# Patient Record
Sex: Female | Born: 1935 | Race: Black or African American | Hispanic: No | Smoking: Never smoker
Health system: Southern US, Community
[De-identification: ages and names within clinical notes are randomized; demographics above are authoritative.]

## PROBLEM LIST (undated history)

## (undated) DIAGNOSIS — K5730 Diverticulosis of large intestine without perforation or abscess without bleeding: Secondary | ICD-10-CM

## (undated) DIAGNOSIS — K2970 Gastritis, unspecified, without bleeding: Secondary | ICD-10-CM

## (undated) DIAGNOSIS — E785 Hyperlipidemia, unspecified: Secondary | ICD-10-CM

## (undated) DIAGNOSIS — F419 Anxiety disorder, unspecified: Secondary | ICD-10-CM

## (undated) DIAGNOSIS — T7840XA Allergy, unspecified, initial encounter: Secondary | ICD-10-CM

## (undated) DIAGNOSIS — I10 Essential (primary) hypertension: Secondary | ICD-10-CM

## (undated) DIAGNOSIS — K5792 Diverticulitis of intestine, part unspecified, without perforation or abscess without bleeding: Secondary | ICD-10-CM

## (undated) DIAGNOSIS — K219 Gastro-esophageal reflux disease without esophagitis: Secondary | ICD-10-CM

## (undated) DIAGNOSIS — M1990 Unspecified osteoarthritis, unspecified site: Secondary | ICD-10-CM

## (undated) DIAGNOSIS — Z8719 Personal history of other diseases of the digestive system: Secondary | ICD-10-CM

## (undated) HISTORY — DX: Hyperlipidemia, unspecified: E78.5

## (undated) HISTORY — PX: UPPER GASTROINTESTINAL ENDOSCOPY: SHX188

## (undated) HISTORY — DX: Anxiety disorder, unspecified: F41.9

## (undated) HISTORY — DX: Unspecified osteoarthritis, unspecified site: M19.90

## (undated) HISTORY — PX: KIDNEY SURGERY: SHX687

## (undated) HISTORY — DX: Allergy, unspecified, initial encounter: T78.40XA

## (undated) HISTORY — DX: Personal history of other diseases of the digestive system: Z87.19

## (undated) HISTORY — PX: WISDOM TOOTH EXTRACTION: SHX21

## (undated) HISTORY — DX: Diverticulosis of large intestine without perforation or abscess without bleeding: K57.30

## (undated) HISTORY — DX: Gastritis, unspecified, without bleeding: K29.70

## (undated) HISTORY — PX: COLONOSCOPY: SHX174

## (undated) HISTORY — DX: Diverticulitis of intestine, part unspecified, without perforation or abscess without bleeding: K57.92

## (undated) HISTORY — PX: TOTAL ABDOMINAL HYSTERECTOMY: SHX209

## (undated) HISTORY — PX: BLADDER REPAIR: SHX76

## (undated) HISTORY — DX: Gastro-esophageal reflux disease without esophagitis: K21.9

## (undated) HISTORY — DX: Essential (primary) hypertension: I10

---

## 1996-03-16 HISTORY — PX: KNEE ARTHROSCOPY: SHX127

## 1999-06-03 ENCOUNTER — Encounter: Payer: Self-pay | Admitting: Pulmonary Disease

## 1999-06-03 ENCOUNTER — Ambulatory Visit (HOSPITAL_COMMUNITY): Admission: RE | Admit: 1999-06-03 | Discharge: 1999-06-03 | Payer: Self-pay | Admitting: Pulmonary Disease

## 2000-11-22 ENCOUNTER — Encounter (INDEPENDENT_AMBULATORY_CARE_PROVIDER_SITE_OTHER): Payer: Self-pay | Admitting: Gastroenterology

## 2001-10-06 ENCOUNTER — Ambulatory Visit (HOSPITAL_COMMUNITY): Admission: RE | Admit: 2001-10-06 | Discharge: 2001-10-06 | Payer: Self-pay | Admitting: Internal Medicine

## 2001-10-06 ENCOUNTER — Encounter: Payer: Self-pay | Admitting: Internal Medicine

## 2004-01-21 ENCOUNTER — Ambulatory Visit (HOSPITAL_COMMUNITY): Admission: RE | Admit: 2004-01-21 | Discharge: 2004-01-21 | Payer: Self-pay | Admitting: Internal Medicine

## 2004-01-21 ENCOUNTER — Ambulatory Visit: Payer: Self-pay | Admitting: Internal Medicine

## 2004-06-10 ENCOUNTER — Ambulatory Visit: Payer: Self-pay | Admitting: Pulmonary Disease

## 2004-09-08 ENCOUNTER — Ambulatory Visit: Payer: Self-pay | Admitting: Internal Medicine

## 2004-10-31 ENCOUNTER — Ambulatory Visit: Payer: Self-pay | Admitting: Internal Medicine

## 2004-12-09 ENCOUNTER — Ambulatory Visit: Payer: Self-pay | Admitting: Pulmonary Disease

## 2005-01-15 ENCOUNTER — Ambulatory Visit: Payer: Self-pay | Admitting: Internal Medicine

## 2005-01-22 ENCOUNTER — Ambulatory Visit: Payer: Self-pay | Admitting: Internal Medicine

## 2005-06-03 ENCOUNTER — Ambulatory Visit: Payer: Self-pay | Admitting: Pulmonary Disease

## 2005-06-08 ENCOUNTER — Ambulatory Visit: Payer: Self-pay | Admitting: Pulmonary Disease

## 2005-12-21 ENCOUNTER — Ambulatory Visit: Payer: Self-pay | Admitting: Internal Medicine

## 2005-12-30 ENCOUNTER — Ambulatory Visit: Payer: Self-pay | Admitting: Pulmonary Disease

## 2006-01-13 ENCOUNTER — Ambulatory Visit: Payer: Self-pay | Admitting: Gastroenterology

## 2006-01-28 ENCOUNTER — Ambulatory Visit: Payer: Self-pay | Admitting: Gastroenterology

## 2006-07-19 ENCOUNTER — Ambulatory Visit: Payer: Self-pay | Admitting: Pulmonary Disease

## 2006-07-19 LAB — CONVERTED CEMR LAB
ALT: 16 units/L (ref 0–40)
AST: 21 units/L (ref 0–37)
Albumin: 3.8 g/dL (ref 3.5–5.2)
Alkaline Phosphatase: 71 units/L (ref 39–117)
BUN: 9 mg/dL (ref 6–23)
Basophils Absolute: 0 10*3/uL (ref 0.0–0.1)
Basophils Relative: 0.6 % (ref 0.0–1.0)
Bilirubin, Direct: 0.1 mg/dL (ref 0.0–0.3)
CO2: 33 meq/L — ABNORMAL HIGH (ref 19–32)
Calcium: 9.5 mg/dL (ref 8.4–10.5)
Chloride: 101 meq/L (ref 96–112)
Cholesterol: 187 mg/dL (ref 0–200)
Creatinine, Ser: 0.8 mg/dL (ref 0.4–1.2)
Eosinophils Absolute: 0.4 10*3/uL (ref 0.0–0.6)
Eosinophils Relative: 6.6 % — ABNORMAL HIGH (ref 0.0–5.0)
GFR calc Af Amer: 91 mL/min
GFR calc non Af Amer: 75 mL/min
Glucose, Bld: 92 mg/dL (ref 70–99)
HCT: 39.9 % (ref 36.0–46.0)
HDL: 60.8 mg/dL (ref >39.0)
Hemoglobin: 13.4 g/dL (ref 12.0–15.0)
LDL Cholesterol: 110 mg/dL — ABNORMAL HIGH (ref 0–99)
Lymphocytes Relative: 41.8 % (ref 12.0–46.0)
MCHC: 33.5 g/dL (ref 30.0–36.0)
MCV: 84.2 fL (ref 78.0–100.0)
Monocytes Absolute: 0.4 10*3/uL (ref 0.2–0.7)
Monocytes Relative: 6.6 % (ref 3.0–11.0)
Neutro Abs: 2.6 10*3/uL (ref 1.4–7.7)
Neutrophils Relative %: 44.4 % (ref 43.0–77.0)
Platelets: 336 10*3/uL (ref 150–400)
Potassium: 3.4 meq/L — ABNORMAL LOW (ref 3.5–5.1)
RBC: 4.74 M/uL (ref 3.87–5.11)
RDW: 12.8 % (ref 11.5–14.6)
Sodium: 140 meq/L (ref 135–145)
TSH: 1.08 microintl units/mL (ref 0.35–5.50)
Total Bilirubin: 0.8 mg/dL (ref 0.3–1.2)
Total CHOL/HDL Ratio: 3.1
Total Protein: 7.1 g/dL (ref 6.0–8.3)
Triglycerides: 82 mg/dL (ref 0–149)
VLDL: 16 mg/dL (ref 0–40)
WBC: 5.9 10*3/uL (ref 4.5–10.5)

## 2006-08-23 ENCOUNTER — Ambulatory Visit: Payer: Self-pay | Admitting: Internal Medicine

## 2006-08-26 ENCOUNTER — Ambulatory Visit: Payer: Self-pay | Admitting: Internal Medicine

## 2006-09-09 ENCOUNTER — Ambulatory Visit: Payer: Self-pay | Admitting: Internal Medicine

## 2007-01-17 ENCOUNTER — Ambulatory Visit: Payer: Self-pay | Admitting: Pulmonary Disease

## 2007-01-20 DIAGNOSIS — F419 Anxiety disorder, unspecified: Secondary | ICD-10-CM | POA: Insufficient documentation

## 2007-01-20 DIAGNOSIS — I10 Essential (primary) hypertension: Secondary | ICD-10-CM | POA: Insufficient documentation

## 2007-01-20 DIAGNOSIS — J309 Allergic rhinitis, unspecified: Secondary | ICD-10-CM | POA: Insufficient documentation

## 2007-01-20 DIAGNOSIS — E785 Hyperlipidemia, unspecified: Secondary | ICD-10-CM

## 2007-01-20 DIAGNOSIS — F411 Generalized anxiety disorder: Secondary | ICD-10-CM

## 2007-01-20 DIAGNOSIS — M1990 Unspecified osteoarthritis, unspecified site: Secondary | ICD-10-CM

## 2007-01-20 DIAGNOSIS — Z9079 Acquired absence of other genital organ(s): Secondary | ICD-10-CM | POA: Insufficient documentation

## 2007-01-20 DIAGNOSIS — J45909 Unspecified asthma, uncomplicated: Secondary | ICD-10-CM

## 2007-02-18 ENCOUNTER — Telehealth: Payer: Self-pay | Admitting: Pulmonary Disease

## 2007-02-22 ENCOUNTER — Telehealth (INDEPENDENT_AMBULATORY_CARE_PROVIDER_SITE_OTHER): Payer: Self-pay | Admitting: *Deleted

## 2007-03-03 ENCOUNTER — Ambulatory Visit: Payer: Self-pay | Admitting: Internal Medicine

## 2007-11-04 ENCOUNTER — Ambulatory Visit: Payer: Self-pay | Admitting: Internal Medicine

## 2007-11-04 ENCOUNTER — Inpatient Hospital Stay (HOSPITAL_COMMUNITY): Admission: EM | Admit: 2007-11-04 | Discharge: 2007-11-06 | Payer: Self-pay | Admitting: Emergency Medicine

## 2007-11-05 ENCOUNTER — Telehealth: Payer: Self-pay | Admitting: Family Medicine

## 2007-11-05 ENCOUNTER — Ambulatory Visit: Payer: Self-pay | Admitting: Internal Medicine

## 2007-11-06 ENCOUNTER — Encounter (INDEPENDENT_AMBULATORY_CARE_PROVIDER_SITE_OTHER): Payer: Self-pay | Admitting: *Deleted

## 2007-11-09 ENCOUNTER — Inpatient Hospital Stay (HOSPITAL_COMMUNITY): Admission: AD | Admit: 2007-11-09 | Discharge: 2007-11-11 | Payer: Self-pay | Admitting: Gastroenterology

## 2007-11-09 ENCOUNTER — Telehealth: Payer: Self-pay | Admitting: Internal Medicine

## 2007-11-11 ENCOUNTER — Encounter (INDEPENDENT_AMBULATORY_CARE_PROVIDER_SITE_OTHER): Payer: Self-pay | Admitting: *Deleted

## 2007-12-12 ENCOUNTER — Ambulatory Visit: Payer: Self-pay | Admitting: Internal Medicine

## 2007-12-12 DIAGNOSIS — Z8719 Personal history of other diseases of the digestive system: Secondary | ICD-10-CM

## 2007-12-12 DIAGNOSIS — H409 Unspecified glaucoma: Secondary | ICD-10-CM

## 2007-12-12 LAB — CONVERTED CEMR LAB
Basophils Absolute: 0 10*3/uL (ref 0.0–0.1)
Basophils Relative: 0.7 % (ref 0.0–3.0)
Eosinophils Absolute: 0.6 10*3/uL (ref 0.0–0.7)
Eosinophils Relative: 10 % — ABNORMAL HIGH (ref 0.0–5.0)
HCT: 35.4 % — ABNORMAL LOW (ref 36.0–46.0)
Hemoglobin: 12.1 g/dL (ref 12.0–15.0)
Lymphocytes Relative: 37.9 % (ref 12.0–46.0)
MCHC: 34.1 g/dL (ref 30.0–36.0)
MCV: 84.6 fL (ref 78.0–100.0)
Monocytes Absolute: 0.5 10*3/uL (ref 0.1–1.0)
Monocytes Relative: 7.4 % (ref 3.0–12.0)
Neutro Abs: 2.8 10*3/uL (ref 1.4–7.7)
Neutrophils Relative %: 44 % (ref 43.0–77.0)
Platelets: 366 10*3/uL (ref 150–400)
RBC: 4.18 M/uL (ref 3.87–5.11)
RDW: 13 % (ref 11.5–14.6)
WBC: 6.3 10*3/uL (ref 4.5–10.5)

## 2007-12-13 ENCOUNTER — Telehealth: Payer: Self-pay | Admitting: Internal Medicine

## 2007-12-23 ENCOUNTER — Ambulatory Visit: Payer: Self-pay | Admitting: Internal Medicine

## 2007-12-23 ENCOUNTER — Telehealth: Payer: Self-pay | Admitting: Internal Medicine

## 2007-12-26 LAB — CONVERTED CEMR LAB
Basophils Absolute: 0.1 10*3/uL (ref 0.0–0.1)
Basophils Relative: 1 % (ref 0.0–3.0)
Eosinophils Absolute: 0.4 10*3/uL (ref 0.0–0.7)
Eosinophils Relative: 6.4 % — ABNORMAL HIGH (ref 0.0–5.0)
HCT: 34.8 % — ABNORMAL LOW (ref 36.0–46.0)
Hemoglobin: 11.4 g/dL — ABNORMAL LOW (ref 12.0–15.0)
Lymphocytes Relative: 34.9 % (ref 12.0–46.0)
MCHC: 32.7 g/dL (ref 30.0–36.0)
MCV: 84.9 fL (ref 78.0–100.0)
Monocytes Absolute: 0.4 10*3/uL (ref 0.1–1.0)
Monocytes Relative: 5.7 % (ref 3.0–12.0)
Neutro Abs: 3.6 10*3/uL (ref 1.4–7.7)
Neutrophils Relative %: 52 % (ref 43.0–77.0)
Platelets: 328 10*3/uL (ref 150–400)
RBC: 4.1 M/uL (ref 3.87–5.11)
RDW: 12.9 % (ref 11.5–14.6)
WBC: 6.9 10*3/uL (ref 4.5–10.5)

## 2007-12-27 DIAGNOSIS — K5730 Diverticulosis of large intestine without perforation or abscess without bleeding: Secondary | ICD-10-CM | POA: Insufficient documentation

## 2007-12-29 ENCOUNTER — Ambulatory Visit: Payer: Self-pay | Admitting: Internal Medicine

## 2007-12-29 LAB — CONVERTED CEMR LAB
Basophils Absolute: 0.1 10*3/uL (ref 0.0–0.1)
Basophils Relative: 0.8 % (ref 0.0–3.0)
Eosinophils Absolute: 0.4 10*3/uL (ref 0.0–0.7)
Eosinophils Relative: 5.3 % — ABNORMAL HIGH (ref 0.0–5.0)
HCT: 36.8 % (ref 36.0–46.0)
Hemoglobin: 12.1 g/dL (ref 12.0–15.0)
Lymphocytes Relative: 27.7 % (ref 12.0–46.0)
MCHC: 33 g/dL (ref 30.0–36.0)
MCV: 83.9 fL (ref 78.0–100.0)
Monocytes Absolute: 0.4 10*3/uL (ref 0.1–1.0)
Monocytes Relative: 5 % (ref 3.0–12.0)
Neutro Abs: 4.5 10*3/uL (ref 1.4–7.7)
Neutrophils Relative %: 61.2 % (ref 43.0–77.0)
Platelets: 307 10*3/uL (ref 150–400)
RBC: 4.38 M/uL (ref 3.87–5.11)
RDW: 13 % (ref 11.5–14.6)
WBC: 7.4 10*3/uL (ref 4.5–10.5)

## 2007-12-30 ENCOUNTER — Telehealth: Payer: Self-pay | Admitting: Internal Medicine

## 2008-01-02 ENCOUNTER — Ambulatory Visit: Payer: Self-pay | Admitting: Internal Medicine

## 2008-01-09 ENCOUNTER — Telehealth: Payer: Self-pay | Admitting: Internal Medicine

## 2008-01-09 ENCOUNTER — Ambulatory Visit: Payer: Self-pay | Admitting: Internal Medicine

## 2008-01-10 LAB — CONVERTED CEMR LAB
HCT: 33.8 % — ABNORMAL LOW (ref 36.0–46.0)
Hemoglobin: 11.2 g/dL — ABNORMAL LOW (ref 12.0–15.0)

## 2008-01-17 ENCOUNTER — Telehealth (INDEPENDENT_AMBULATORY_CARE_PROVIDER_SITE_OTHER): Payer: Self-pay | Admitting: *Deleted

## 2008-01-19 ENCOUNTER — Encounter: Payer: Self-pay | Admitting: Internal Medicine

## 2008-01-24 ENCOUNTER — Ambulatory Visit: Payer: Self-pay | Admitting: Internal Medicine

## 2008-01-25 LAB — CONVERTED CEMR LAB
Basophils Absolute: 0 10*3/uL (ref 0.0–0.1)
Basophils Relative: 0.7 % (ref 0.0–3.0)
Eosinophils Absolute: 0.4 10*3/uL (ref 0.0–0.7)
Hemoglobin: 11.6 g/dL — ABNORMAL LOW (ref 12.0–15.0)
MCHC: 33.7 g/dL (ref 30.0–36.0)
MCV: 83.4 fL (ref 78.0–100.0)
Monocytes Absolute: 0.3 10*3/uL (ref 0.1–1.0)
Neutro Abs: 2.2 10*3/uL (ref 1.4–7.7)
Neutrophils Relative %: 44.3 % (ref 43.0–77.0)
RBC: 4.13 M/uL (ref 3.87–5.11)

## 2008-02-23 ENCOUNTER — Ambulatory Visit: Payer: Self-pay | Admitting: Pulmonary Disease

## 2008-02-23 ENCOUNTER — Ambulatory Visit: Payer: Self-pay | Admitting: Internal Medicine

## 2008-08-10 ENCOUNTER — Ambulatory Visit: Payer: Self-pay | Admitting: Internal Medicine

## 2008-08-10 LAB — CONVERTED CEMR LAB
BUN: 9 mg/dL (ref 6–23)
Calcium: 9.4 mg/dL (ref 8.4–10.5)
GFR calc non Af Amer: 105.54 mL/min (ref >60)
Glucose, Bld: 92 mg/dL (ref 70–99)
TSH: 1.35 microintl units/mL (ref 0.35–5.50)

## 2008-10-23 ENCOUNTER — Telehealth: Payer: Self-pay | Admitting: Internal Medicine

## 2008-10-23 ENCOUNTER — Ambulatory Visit: Payer: Self-pay | Admitting: Internal Medicine

## 2008-10-23 DIAGNOSIS — K625 Hemorrhage of anus and rectum: Secondary | ICD-10-CM | POA: Insufficient documentation

## 2008-10-23 LAB — CONVERTED CEMR LAB
Eosinophils Relative: 3.5 % (ref 0.0–5.0)
HCT: 38.7 % (ref 36.0–46.0)
Hemoglobin: 13.1 g/dL (ref 12.0–15.0)
Lymphocytes Relative: 28.9 % (ref 12.0–46.0)
Lymphs Abs: 1.8 10*3/uL (ref 0.7–4.0)
Monocytes Relative: 4.6 % (ref 3.0–12.0)
Neutro Abs: 3.8 10*3/uL (ref 1.4–7.7)
Platelets: 278 10*3/uL (ref 150.0–400.0)
WBC: 6.2 10*3/uL (ref 4.5–10.5)

## 2008-10-24 ENCOUNTER — Telehealth: Payer: Self-pay | Admitting: Physician Assistant

## 2008-12-03 ENCOUNTER — Telehealth: Payer: Self-pay | Admitting: Internal Medicine

## 2009-01-19 ENCOUNTER — Encounter: Payer: Self-pay | Admitting: Internal Medicine

## 2009-01-28 ENCOUNTER — Ambulatory Visit: Payer: Self-pay | Admitting: Internal Medicine

## 2009-01-28 LAB — CONVERTED CEMR LAB
CO2: 31 meq/L (ref 19–32)
Calcium: 9.2 mg/dL (ref 8.4–10.5)
Chloride: 106 meq/L (ref 96–112)
Glucose, Bld: 91 mg/dL (ref 70–99)
Sodium: 145 meq/L (ref 135–145)

## 2009-01-30 ENCOUNTER — Ambulatory Visit: Payer: Self-pay | Admitting: Internal Medicine

## 2009-01-30 ENCOUNTER — Telehealth: Payer: Self-pay | Admitting: Internal Medicine

## 2009-01-30 LAB — CONVERTED CEMR LAB: Metaneph Total, Ur: 303 ug/24hr (ref 224–832)

## 2009-01-31 ENCOUNTER — Ambulatory Visit: Payer: Self-pay | Admitting: Internal Medicine

## 2009-01-31 ENCOUNTER — Encounter (HOSPITAL_COMMUNITY): Admission: RE | Admit: 2009-01-31 | Discharge: 2009-03-15 | Payer: Self-pay | Admitting: Internal Medicine

## 2009-01-31 ENCOUNTER — Ambulatory Visit: Payer: Self-pay

## 2009-02-01 ENCOUNTER — Telehealth: Payer: Self-pay | Admitting: Internal Medicine

## 2009-02-05 ENCOUNTER — Ambulatory Visit: Payer: Self-pay | Admitting: Internal Medicine

## 2009-03-16 HISTORY — PX: OTHER SURGICAL HISTORY: SHX169

## 2009-03-18 ENCOUNTER — Telehealth: Payer: Self-pay | Admitting: Pulmonary Disease

## 2009-03-18 ENCOUNTER — Telehealth: Payer: Self-pay | Admitting: Internal Medicine

## 2009-04-18 ENCOUNTER — Ambulatory Visit: Payer: Self-pay | Admitting: Internal Medicine

## 2009-04-26 ENCOUNTER — Ambulatory Visit: Payer: Self-pay | Admitting: Internal Medicine

## 2009-05-08 ENCOUNTER — Ambulatory Visit: Payer: Self-pay | Admitting: Pulmonary Disease

## 2009-05-24 ENCOUNTER — Telehealth: Payer: Self-pay | Admitting: Pulmonary Disease

## 2009-05-24 ENCOUNTER — Telehealth: Payer: Self-pay | Admitting: Internal Medicine

## 2009-08-05 ENCOUNTER — Telehealth: Payer: Self-pay | Admitting: Internal Medicine

## 2009-08-06 ENCOUNTER — Telehealth (INDEPENDENT_AMBULATORY_CARE_PROVIDER_SITE_OTHER): Payer: Self-pay | Admitting: *Deleted

## 2009-08-06 ENCOUNTER — Ambulatory Visit: Payer: Self-pay | Admitting: Pulmonary Disease

## 2009-08-06 DIAGNOSIS — J209 Acute bronchitis, unspecified: Secondary | ICD-10-CM

## 2009-09-12 ENCOUNTER — Ambulatory Visit: Payer: Self-pay | Admitting: Internal Medicine

## 2009-10-04 ENCOUNTER — Telehealth (INDEPENDENT_AMBULATORY_CARE_PROVIDER_SITE_OTHER): Payer: Self-pay | Admitting: *Deleted

## 2009-10-07 ENCOUNTER — Ambulatory Visit: Payer: Self-pay | Admitting: Internal Medicine

## 2009-10-07 LAB — CONVERTED CEMR LAB
HDL goal, serum: 40 mg/dL
LDL Goal: 160 mg/dL

## 2009-10-21 ENCOUNTER — Ambulatory Visit: Payer: Self-pay | Admitting: Internal Medicine

## 2009-11-07 ENCOUNTER — Ambulatory Visit: Payer: Self-pay | Admitting: Internal Medicine

## 2009-11-09 ENCOUNTER — Telehealth: Payer: Self-pay | Admitting: Internal Medicine

## 2009-11-14 ENCOUNTER — Ambulatory Visit: Payer: Self-pay | Admitting: Internal Medicine

## 2009-11-14 LAB — CONVERTED CEMR LAB
ALT: 13 units/L (ref 0–35)
AST: 17 units/L (ref 0–37)
Alkaline Phosphatase: 66 units/L (ref 39–117)
BUN: 12 mg/dL (ref 6–23)
Basophils Absolute: 0 10*3/uL (ref 0.0–0.1)
Bilirubin, Direct: 0.2 mg/dL (ref 0.0–0.3)
Calcium: 9.5 mg/dL (ref 8.4–10.5)
Cholesterol: 193 mg/dL (ref 0–200)
Creatinine, Ser: 0.8 mg/dL (ref 0.4–1.2)
Eosinophils Relative: 2.7 % (ref 0.0–5.0)
GFR calc non Af Amer: 86.41 mL/min (ref >60)
Glucose, Bld: 78 mg/dL (ref 70–99)
Lymphocytes Relative: 32.8 % (ref 12.0–46.0)
Lymphs Abs: 2.1 10*3/uL (ref 0.7–4.0)
Monocytes Relative: 7.6 % (ref 3.0–12.0)
Neutrophils Relative %: 56.5 % (ref 43.0–77.0)
Platelets: 302 10*3/uL (ref 150.0–400.0)
Potassium: 4.8 meq/L (ref 3.5–5.1)
RDW: 14.9 % — ABNORMAL HIGH (ref 11.5–14.6)
TSH: 1.34 microintl units/mL (ref 0.35–5.50)
Total Bilirubin: 0.7 mg/dL (ref 0.3–1.2)
Total Protein: 6.5 g/dL (ref 6.0–8.3)
VLDL: 12 mg/dL (ref 0.0–40.0)
Vitamin B-12: 375 pg/mL (ref 211–911)
WBC: 6.4 10*3/uL (ref 4.5–10.5)

## 2009-11-17 ENCOUNTER — Encounter: Payer: Self-pay | Admitting: Internal Medicine

## 2009-12-09 ENCOUNTER — Telehealth: Payer: Self-pay | Admitting: Internal Medicine

## 2009-12-09 DIAGNOSIS — J370 Chronic laryngitis: Secondary | ICD-10-CM

## 2009-12-26 ENCOUNTER — Telehealth: Payer: Self-pay | Admitting: Internal Medicine

## 2009-12-31 ENCOUNTER — Encounter: Payer: Self-pay | Admitting: Internal Medicine

## 2010-02-04 ENCOUNTER — Encounter: Payer: Self-pay | Admitting: Internal Medicine

## 2010-02-10 ENCOUNTER — Telehealth: Payer: Self-pay | Admitting: Pulmonary Disease

## 2010-02-14 ENCOUNTER — Encounter: Payer: Self-pay | Admitting: Internal Medicine

## 2010-03-06 ENCOUNTER — Encounter: Payer: Self-pay | Admitting: Internal Medicine

## 2010-04-15 NOTE — Progress Notes (Signed)
Summary: talk to nurse  Phone Note Call from Patient Call back at Home Phone [phone]   Caller: Patient Call For: nadel Reason for Call: Talk to Nurse Summary of Call: Pt states she has been taking delsym and it's not working, causing her to have diarrhea, wants to know what to take, pls advise.//[facility] Initial call taken by: Darletta Moll,  October 04, 2009 3:22 PM  Follow-up for Phone Call        Called, spoke with pt.  Pt states she has a nonprod cough.  Robitussion DM doesn't really help a lot and delsym is causing diarhea.  Pt also c/o breathing difficulty that "goes and comes" x2wks.  Believes OV would be best.  OV scheduled to discuss these issues with MW on MOnday 10/07/09 at 9:40am--pt aware.  Advised ER is sxs worsen before Monday.  Gweneth Dimitri RN  October 04, 2009 3:59 PM      Appended Document: talk to nurse Patient empolyer/friend Dr Glennon Hamilton and expressed that patient wishes that she cannot make it in morning. I spoke to her and confirmed it. She is therefore requesting a PM slot with me . I  only have 7 in my list for tomorrow monday afternoon. Please call her at [phone] or [phone] (work) and change it to 4:30-5pm. Please schedule her with me.  Thanks, MR  Appended Document: talk to nurse called and spoke with pts family---they and the pt are aware of appt with MR today at 4:45.

## 2010-04-15 NOTE — Assessment & Plan Note (Signed)
Summary: rsc from 3-3/follow up/la   Primary Care Provider:  Pennie Banter MD  CC:  14 month ROV....  History of Present Illness: 77 y/o BF here for a follow up visit... 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 she sees DrNorins for primary care and Dr Presley Raddle for GI... we have attended her for Pulm issues including allergic rhinitis and asthma- doing well on Pulmicort, Albuterol, Allegra, and Nasonex...   ~  February 23, 2008:  from the pulm standpoint she has had a good yr... no asthma exas and she's been taking the Pulmicort regularly... states she uses the Albuterol on occas but it is not entirely clear why?... denies cough, sputum, hemoptysis, worsening dyspnea,  wheezing, chest pains, snoring, daytime hypersomnolence, etc...    ~  May 08, 2009:  she has had some trouble w/ her BP & DrNorins has been adjusting her meds- now under better control... she is feeling well overall- denies any pulm exacerbations, cough, sputum, CP, or incr dyspnea... she has been too sedentary w/ DOE noted but not really progressive... we discussed the need for PulmRehab vs exercising on her own... she has some nasal drainage & uses the Allegra/ Nasonex w/ varying degrees of success... she has been regular w/ her Pulmicort & we discussed taking the Proair 10-5min prior to the flexhaler...   Current Problems:  GLAUCOMA (ICD-365.9) - on drops from DrShapiro... ALLERGY (ICD-995.3) - on ALLEGRA 60mg  in AM & NASONEX 2 sp at bedtime... ASTHMA (ICD-493.90) - as above on PULMICORT 2 spBid, and PROAIR Prn... HYPERTENSION (ICD-401.9) - on COZAAR 100mg /d, LASIX 40mg /d, Bystolic 10mg /d, AMLODIPINE 5mg /d. HYPERLIPIDEMIA (ICD-272.4) - on ZETIA 10mg /d... GERD (ICD-530.81) - on PRILOSEC OTC 20mg /d... DIVERTICULOSIS OF COLON (ICD-562.10) DIVERTICULAR BLEEDING, HX OF (ICD-V12.79) - she is off ASA now and on Fe... Family Hx of COLON CANCER (ICD-153.9) HYSTERECTOMY, HX OF (ICD-V45.77) DEGENERATIVE  JOINT DISEASE (ICD-715.90) ANXIETY (ICD-300.00)    Allergies: 1)  ! Codeine  Comments:  Nurse/Medical Assistant: The patient's medications and allergies were reviewed with the patient and were updated in the Medication and Allergy Lists.  Past History:  Past Medical History:  GLAUCOMA (ICD-365.9) ALLERGY (ICD-995.3) ASTHMA (ICD-493.90) HYPERTENSION (ICD-401.9) HYPERLIPIDEMIA (ICD-272.4) GERD (ICD-530.81) DIVERTICULOSIS OF COLON (ICD-562.10) DIVERTICULAR BLEEDING, HX OF (ICD-V12.79) HYSTERECTOMY, HX OF (ICD-V45.77) DEGENERATIVE JOINT DISEASE (ICD-715.90) ANXIETY (ICD-300.00)  Past Surgical History: Hysterectomy-fibroid tumor Bladder repair after perforation Kidney surgery Left Knee arthroscopy '98  Family History: Reviewed history from 12/12/2007 and no changes required. father - deceased @35 : cause of death unkown mother-deceased @ 12: bladder tumors Neg - breast or colon cancer; DM; CAD  Social History: Reviewed history from 12/12/2007 and no changes required. 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  Review of Systems      See HPI       The patient complains of dyspnea on exertion.  The patient denies anorexia, fever, weight loss, weight gain, vision loss, decreased hearing, hoarseness, chest pain, syncope, peripheral edema, prolonged cough, headaches, hemoptysis, abdominal pain, melena, hematochezia, severe indigestion/heartburn, hematuria, incontinence, muscle weakness, suspicious skin lesions, transient blindness, difficulty walking, depression, unusual weight change, abnormal bleeding, enlarged lymph nodes, and angioedema.    Vital Signs:  Patient profile:   77 year old female Height:      66 inches Weight:      179 pounds O2 Sat:      100 % on  Room air Temp:     98.6 degrees F oral Pulse rate:   60 / minute BP sitting:   148 / 80  (right  arm) Cuff size:   regular  Vitals Entered By: Randell Loop CMA (May 08, 2009 3:03 PM)  O2 Sat at Rest %:  100 O2 Flow:  Room air CC: 14 month ROV... Comments MEDS UPDATED TODAY   Physical Exam  Additional Exam:  WD, WN, 77 y/o BF in NAD... GENERAL:  Alert & oriented; pleasant & cooperative... HEENT:  [place]/AT, EOM-wnl, PERRLA, EACs-clear, TMs-wnl, NOSE-clear, THROAT-clear & wnl. NECK:  Supple w/ fairROM; no JVD; normal carotid impulses w/o bruits; no thyromegaly or nodules palpated; no lymphadenopathy. CHEST:  Clear to P & A; without wheezes/ rales/ or rhonchi. HEART:  Regular Rhythm; without murmurs/ rubs/ or gallops. ABDOMEN:  Soft & nontender; normal bowel sounds; no organomegaly or masses detected. EXT: without deformities, mild arthritic changes; no varicose veins/ +venous insuffic/ no edema. NEURO:  CN's intact; no focal neuro deficits... DERM:  No lesions noted; no rash etc...    CXR  Procedure date:  05/08/2009  Findings:      CHEST - 2 VIEW Comparison: None.   Findings: Trachea is midline.  Heart size normal.  Lungs are clear. No pleural fluid.   IMPRESSION: No acute findings.   Read By:  Reyes Ivan.,  M.D.    Pulmonary Function Test Date: 05/08/2009 3:52 PM Gender: Female  Pre-Spirometry FVC    Value: 1.66 L/min   Pred: 2.47 L/min     % Pred: 67 % FEV1    Value: 1.13 L     Pred: 1.91 L     % Pred: 59 % FEV1/FVC  Value: 68 %     Pred: 77 %     % Pred: -- % FEF 25-75  Value: 0.51 L/min   Pred: 1.70 L/min     % Pred: 30 %  Comments: Mild to mod airflow obstruction is shown, cannot r/o superimposed restriction w/o lung volumes...  SN  Impression & Recommendations:  Problem # 1:  ASTHMA UNSPECIFIED (ICD-493.90) She is stable on the Pulmicort/ Proair... needs incr exercise program but she declines PulmRehab program... we discussed incr exercise at home & continuing the current meds... CXR is clear... call for any problems... Her updated  medication list for this problem includes:    Pulmicort Flexhaler 180 Mcg/act Inha (Budesonide (inhalation)) .Marland Kitchen... 2 puffs two times a day    Proair Hfa 108 (90 Base) Mcg/act Aers (Albuterol sulfate) ..... Inhale 2 sprays every 6 h as needed for wheezing...  Orders: T-2 View CXR (71020TC)  Problem # 2:  GERD (ICD-530.81) Continue the antireflux regimen, PPI, etc... The following medications were removed from the medication list:    Zantac 75 75 Mg Tabs (Ranitidine hcl) .Marland Kitchen... Take 2 tabs daily... Her updated medication list for this problem includes:    Prilosec Otc 20 Mg Tbec (Omeprazole magnesium) .Marland Kitchen... 1 tab once daily 30 minutes before breakfast  Problem # 3:  HYPERTENSION (ICD-401.9) BP improved w/ 4 meds per DrNOrins... encouraged to take them regularly... Her updated medication list for this problem includes:    Bystolic 10 Mg Tabs (Nebivolol hcl) .Marland Kitchen... Take 1 tablet by mouth once a day    Amlodipine Besylate 5 Mg Tabs (Amlodipine besylate) .Marland Kitchen... 1 by mouth once daily    Cozaar 100 Mg Tabs (Losartan potassium) .Marland Kitchen... Take 1 tablet by mouth once a day    Furosemide 40  Mg Tabs (Furosemide) .Marland Kitchen... Take 1 tablet by mouth once a day  Problem # 4:  OTHER MEDICAL PROBLEMS AS NOTED>> Followed closely by DrNorins...  Complete Medication List: 1)  Cosopt 2-0.5 % Soln (Dorzolamide-timolol) .... One drop three times a day 2)  Pilopine Hs 4 % Gel (Pilocarpine hcl) .... Use in right eye at bedtime 3)  Allegra 60 Mg Tabs (Fexofenadine hcl) .... Take 1-2 tablet by mouth once a day as needed for allergies.Marland KitchenMarland Kitchen 4)  Nasonex 50 Mcg/act Susp (Mometasone furoate) .... Use 2 sprays in each nostril at bedtime.Marland KitchenMarland Kitchen 5)  Pulmicort Flexhaler 180 Mcg/act Inha (Budesonide (inhalation)) .... 2 puffs two times a day 6)  Proair Hfa 108 (90 Base) Mcg/act Aers (Albuterol sulfate) .... Inhale 2 sprays every 6 h as needed for wheezing... 7)  Robitussin Dm 100-10 Mg/13ml Syrp (Dextromethorphan-guaifenesin) .... As  needed 8)  Bystolic 10 Mg Tabs (Nebivolol hcl) .... Take 1 tablet by mouth once a day 9)  Amlodipine Besylate 5 Mg Tabs (Amlodipine besylate) .Marland Kitchen.. 1 by mouth once daily 10)  Cozaar 100 Mg Tabs (Losartan potassium) .... Take 1 tablet by mouth once a day 11)  Furosemide 40 Mg Tabs (Furosemide) .... Take 1 tablet by mouth once a day 12)  Zetia 10 Mg Tabs (Ezetimibe) .... Take 1 tablet by mouth once a day 13)  Prilosec Otc 20 Mg Tbec (Omeprazole magnesium) .Marland Kitchen.. 1 tab once daily 30 minutes before breakfast 14)  Clotrimazole-betamethasone 1-0.05 % Crea (Clotrimazole-betamethasone) .... Apply to rash two times a day...  Other Orders: Prescription Created Electronically [id number])  Patient Instructions: 1)  Today we updated your med list- see below.... 2)  We wrote new perscriptions for your meds... 3)  Today we did a follow up Pulm Function Test, and a CXR- please call the "phone tree" in a few days for your results.Marland KitchenMarland Kitchen  4)  Try to stay as active as possible- physical therapy is the BEST thing to maintain your mobility & stamina.Marland KitchenMarland Kitchen 5)  Call for any problems.Marland KitchenMarland Kitchen 6)  Please schedule a follow-up appointment in 1 year, sooner as needed... Prescriptions: PROAIR HFA 108 (90 BASE) MCG/ACT AERS (ALBUTEROL SULFATE) inhale 2 sprays every 6 H as needed for wheezing...  #1 x prn   Entered and Authorized by:   Michele Mcalpine MD   Signed by:   Michele Mcalpine MD on 05/08/2009   Method used:   Print then Give to Patient   RxID:   [id number] PULMICORT FLEXHALER 180 MCG/ACT  INHA (BUDESONIDE (INHALATION)) 2 puffs two times a day  #1 x prn   Entered and Authorized by:   Michele Mcalpine MD   Signed by:   Michele Mcalpine MD on 05/08/2009   Method used:   Print then Give to Patient   RxID:   [id number] NASONEX 50 MCG/ACT  SUSP (MOMETASONE FUROATE) use 2 sprays in each nostril at bedtime...  #1 x prn   Entered and Authorized by:   Michele Mcalpine MD   Signed by:   Michele Mcalpine MD on 05/08/2009   Method used:    Print then Give to Patient   RxID:   [id number] ALLEGRA 60 MG  TABS (FEXOFENADINE HCL) Take 1-2 tablet by mouth once a day as needed for allergies...  #30 x prn   Entered and Authorized by:   Michele Mcalpine MD   Signed by:   Michele Mcalpine MD on 05/08/2009   Method used:   Print then Give to  Patient   RxID:   [id number]    [facility] Spirometry  ID: [id number] Patient: [name], [name] DOB: Jul 24, 1933 Age: 77 Years Old Sex: Female Race: Black Physician: Aarush Stukey Height: 66 Weight: 179 Smoker: No Status: Unconfirmed Past Medical History:   GLAUCOMA (ICD-365.9) ALLERGY (ICD-995.3) ASTHMA (ICD-493.90) HYPERTENSION (ICD-401.9) HYPERLIPIDEMIA (ICD-272.4) GERD (ICD-530.81) DIVERTICULOSIS OF COLON (ICD-562.10) DIVERTICULAR BLEEDING, HX OF (ICD-V12.79)  HYSTERECTOMY, HX OF (ICD-V45.77) DEGENERATIVE JOINT DISEASE (ICD-715.90) ANXIETY (ICD-300.00)   Recorded: 05/08/2009 3:52 PM  Parameter  Measured Predicted %Predicted FVC     1.66        2.47        67 FEV1     1.13        1.91        59 FEV1%   68        76.77        -- PEF    3.77        4.88        77.20   Interpretation:

## 2010-04-15 NOTE — Progress Notes (Signed)
Summary: refill  Phone Note Call from Patient Call back at Home Phone [phone]   Caller: Patient Call For: nadel Reason for Call: Refill Medication Summary of Call: Patient needing refill--zetia 10mg --[facility] Initial call taken by: Lehman Prom,  February 10, 2010 3:45 PM  Follow-up for Phone Call        CALLED AND SPOKE WITH PT AND SHE IS AWARE OF ZETIA BEING SENT TO HER PHARMACY FOR REFILLS. Randell Loop CMA  February 10, 2010 3:48 PM     Prescriptions: ZETIA 10 MG  TABS (EZETIMIBE) Take 1 tablet by mouth once a day  #30 Tablet x 3   Entered by:   Randell Loop CMA   Authorized by:   Michele Mcalpine MD   Signed by:   Randell Loop CMA on 02/10/2010   Method used:   Electronically to        [facility] (retail)       [address]       [place], [place]  [id number]       Ph: [phone]       Fax: [phone]   RxID:   [id number]

## 2010-04-15 NOTE — Assessment & Plan Note (Signed)
Summary: THROAT PROBLEM/NWS   Vital Signs:  Patient profile:   77 year old female Height:      66 inches Weight:      171 pounds BMI:     27.70 O2 Sat:      97 % on Room air Temp:     98.2 degrees F oral Pulse rate:   56 / minute BP sitting:   150 / 72  (left arm) Cuff size:   regular  Vitals Entered By: Bill Salinas CMA (November 07, 2009 2:57 PM)  O2 Flow:  Room air CC: pt here for evaluation of losing her voice. She states symptoms started yesterday. Pt denies sore throat/ ab   Primary Care Provider:  Pennie Banter MD -PMD, Dr Alroy Dust - Pulmonary  CC:  pt here for evaluation of losing her voice. She states symptoms started yesterday. Pt denies sore throat/ ab.  History of Present Illness: Patient presents with a 24 hour h/o laryngitis. she has had no fever or chills, no sore throat, no post-nasal drainage. She is using symbicort 160/4.5 one puff twice a day since July 26th. No odynophagia or dysphagia. No adenopathy noted. No respiratory problems or effect. No wheezing.   In the interval since her last visit she had OS cataract extraction with IOL. she remains on qtts. She is making a good recovery.   Current Medications (verified): 1)  Cosopt 2-0.5 %  Soln (Dorzolamide-Timolol) .... One Drop Three Times A Day 2)  Pilopine Hs 4 %  Gel (Pilocarpine Hcl) .... Use in Right Eye At Bedtime 3)  Allegra 60 Mg  Tabs (Fexofenadine Hcl) .... Take 1-2 Tablet By Mouth Once A Day As Needed For Allergies.Marland KitchenMarland Kitchen 4)  Nasonex 50 Mcg/act  Susp (Mometasone Furoate) .... Use 2 Sprays in Each Nostril At Bedtime.Marland KitchenMarland Kitchen 5)  Proair Hfa 108 (90 Base) Mcg/act Aers (Albuterol Sulfate) .... Inhale 2 Sprays Every 6 H As Needed For Wheezing... 6)  Bystolic 10 Mg Tabs (Nebivolol Hcl) .... Take 1 Tablet By Mouth Once A Day 7)  Amlodipine Besylate 5 Mg Tabs (Amlodipine Besylate) .Marland Kitchen.. 1 By Mouth Once Daily 8)  Cozaar 100 Mg  Tabs (Losartan Potassium) .... Take 1 Tablet By Mouth Once A Day 9)  Furosemide 40 Mg   Tabs (Furosemide) .... Take 1 Tablet By Mouth Once A Day 10)  Zetia 10 Mg  Tabs (Ezetimibe) .... Take 1 Tablet By Mouth Once A Day 11)  Prilosec Otc 20 Mg Tbec (Omeprazole Magnesium) .Marland Kitchen.. 1 Tab Once Daily 30 Minutes Before Breakfast 12)  Aerochamber Plus  Misc (Spacer/aero-Holding Chambers) .... Use As Directed  Allergies (verified): 1)  ! Codeine  Past History:  Past Medical History: Last updated: 08/06/2009 GLAUCOMA (ICD-365.9) ALLERGY (ICD-995.3) ASTHMA (ICD-493.90) HYPERTENSION (ICD-401.9) HYPERLIPIDEMIA (ICD-272.4) GERD (ICD-530.81) DIVERTICULOSIS OF COLON (ICD-562.10) DIVERTICULAR BLEEDING, HX OF (ICD-V12.79) HYSTERECTOMY, HX OF (ICD-V45.77) DEGENERATIVE JOINT DISEASE (ICD-715.90) ANXIETY (ICD-300.00)  Past Surgical History: Hysterectomy-fibroid tumor Bladder repair after perforation Kidney surgery Left Knee arthroscopy '98 cataract w/ IOL OS - '11 ([facility])  Review of Systems  The patient denies anorexia, fever, weight loss, weight gain, decreased hearing, chest pain, syncope, peripheral edema, prolonged cough, hemoptysis, abdominal pain, hematochezia, hematuria, incontinence, muscle weakness, transient blindness, difficulty walking, depression, unusual weight change, abnormal bleeding, and angioedema.    Physical Exam  General:  WNWD older AA female in no distress Head:  normocephalic and no abnormalities observed.   Eyes:  corneas and lenses clear and no injection.   Mouth:  posterior pharynx  clear. Neck:  supple.   Lungs:  normal respiratory effort and normal breath sounds.   Heart:  normal rate and regular rhythm.   Abdomen:  soft, non-tender, normal bowel sounds, no guarding, and no rigidity.   Neurologic:  alert & oriented X3, cranial nerves II-XII intact, and gait normal.   Skin:  turgor normal and color normal.   Cervical Nodes:  no anterior cervical adenopathy and no posterior cervical adenopathy.   Psych:  Oriented X3, memory intact for recent and  remote, normally interactive, and good eye contact.     Impression & Recommendations:  Problem # 1:  ACUTE LARYNGITIS, WITHOUT MENTION OF OBSTRUCTIO (ICD-464.00) Acute laryngitis with no sign of infection on exam. Etiology allergy vs inhaler.  Plan - hold symbicort for several days - may use albuterol inhaler as needed.          will follow-up by phone in regard to symptoms.   Complete Medication List: 1)  Cosopt 2-0.5 % Soln (Dorzolamide-timolol) .... One drop three times a day 2)  Pilopine Hs 4 % Gel (Pilocarpine hcl) .... Use in right eye at bedtime 3)  Allegra 60 Mg Tabs (Fexofenadine hcl) .... Take 1-2 tablet by mouth once a day as needed for allergies.Marland KitchenMarland Kitchen 4)  Nasonex 50 Mcg/act Susp (Mometasone furoate) .... Use 2 sprays in each nostril at bedtime.Marland KitchenMarland Kitchen 5)  Proair Hfa 108 (90 Base) Mcg/act Aers (Albuterol sulfate) .... Inhale 2 sprays every 6 h as needed for wheezing... 6)  Bystolic 10 Mg Tabs (Nebivolol hcl) .... Take 1 tablet by mouth once a day 7)  Amlodipine Besylate 5 Mg Tabs (Amlodipine besylate) .Marland Kitchen.. 1 by mouth once daily 8)  Cozaar 100 Mg Tabs (Losartan potassium) .... Take 1 tablet by mouth once a day 9)  Furosemide 40 Mg Tabs (Furosemide) .... Take 1 tablet by mouth once a day 10)  Zetia 10 Mg Tabs (Ezetimibe) .... Take 1 tablet by mouth once a day 11)  Prilosec Otc 20 Mg Tbec (Omeprazole magnesium) .Marland Kitchen.. 1 tab once daily 30 minutes before breakfast 12)  Aerochamber Plus Misc (Spacer/aero-holding chambers) .... Use as directed 13)  Symbicort 160-4.5 Mcg/act Aero (Budesonide-formoterol fumarate) .Marland Kitchen.. 1 puff two times a day  Patient Instructions: 1)  laryngitis - no sign of infection on exam and no symptoms of infection by history. Suspect viral upper respiratory infection vs laryngitis from the symbicort. Plan - leave off symbicort for 3 days (use albuterol inhaler if needed). Let me know if the laryngitis improves. After 3 days restart the symbicort....if laryngitis returns we  will need Dr. Kriste Basque to change your medication.

## 2010-04-15 NOTE — Progress Notes (Signed)
Summary: congested/ cough  Phone Note Call from Patient   Caller: Patient Call For: nadel Summary of Call: pt congested and  coughing would like ov this am . was offered appt with tammy but she would like am appt Initial call taken by: Rickard Patience,  Aug 06, 2009 8:18 AM  Follow-up for Phone Call        called and spoke with pt.  pt states she called her PCP yesterday (08-05-2009) and was given rx for Promethazine w/ Codeine.  Pt states she is still coughing up clear sputum, chest congestion, sore throat, body sore from coughing, tightness in chest, "feels weak," head congestion with clear nasal drainage andincreased sob with exertion.  Pt unsure if she had fever or not- unable to take temp.  Pt requesting to be seen today.  No appts avail with SN or TP.  Please advise.  Thanks.  Aundra Millet Reynolds LPN  Aug 06, 2009 9:29 AM  allergies: codeine  Additional Follow-up for Phone Call Additional follow up Details #1::        PER SN PLEASE ADD PT ON AT 4PM TODAY--PT IS AWARE OF APPT Randell Loop CMA  Aug 06, 2009 9:38 AM

## 2010-04-15 NOTE — Progress Notes (Signed)
Summary: REQ FOR COUGH MED  Phone Note Call from Patient Call back at [facility] Phone [phone]   Summary of Call:  C/o sore throat, productive cough w/clear mucus and sinus drainage. She is taking robitussin DM, otc throat spray and nasonex. No fever, body aches or chills. Patient is requesting RX  to help w/cough. Initial call taken by: Lamar Sprinkles, CMA,  Aug 05, 2009 10:25 AM  Follow-up for Phone Call        OK for promethazine/codeine 1 tsp q 6 , 8 oz, 1 refill Follow-up by: Jacques Navy MD,  Aug 05, 2009 1:36 PM  Additional Follow-up for Phone Call Additional follow up Details #1::        rx called in/pt notified Additional Follow-up by: Rock Nephew CMA,  Aug 05, 2009 1:41 PM    New/Updated Medications: PROMETHAZINE-CODEINE 6.25-10 MG/5ML SYRP (PROMETHAZINE-CODEINE) 1 tsp q 6 Prescriptions: PROMETHAZINE-CODEINE 6.25-10 MG/5ML SYRP (PROMETHAZINE-CODEINE) 1 tsp q 6  #8oz x 1   Entered by:   Rock Nephew CMA   Authorized by:   Jacques Navy MD   Signed by:   Rock Nephew CMA on 08/05/2009   Method used:   Telephoned to ...       [facility] [address] (retail)       [address]       [place], [place]  [id number]       Ph: [phone]       Fax: [phone]   RxID:   [id number]

## 2010-04-15 NOTE — Letter (Signed)
[facility] [address] [place], [place]  [address] Phone: [phone]      November 19, 2009   Kimbly Salatino [address] [place], [place] [address]  RE:  LAB RESULTS  Dear  Ms. Carra,  The following is an interpretation of your most recent lab tests.  Please take note of any instructions provided or changes to medications that have resulted from your lab work.  ELECTROLYTES:  Good - no changes needed  KIDNEY FUNCTION TESTS:  Good - no changes needed  LIVER FUNCTION TESTS:  Good - no changes needed  LIPID PANEL:  Good - no changes needed Triglyceride: 60.0   Cholesterol: 193   LDL: 120   HDL: 61.30   Chol/HDL%:  3  THYROID STUDIES:  Thyroid studies normal TSH: 1.34     DIABETIC STUDIES:  Good - no changes needed Blood Glucose: 78    CBC:  Good - no changes needed  B12 level is normal   Please come see me if you have any questions about these lab results    Sincerely Yours,    Jacques Navy MD  Patient: [name] Note: All result statuses are Final unless otherwise noted.  Tests: (1) Lipid Panel (LIPID)   Cholesterol               193 mg/dL                   2-956     ATP III Classification            Desirable:  < 200 mg/dL                    Borderline High:  200 - 239 mg/dL               High:  > = 240 mg/dL   Triglycerides             60.0 mg/dL                  2.1-308.6     Normal:  <150 mg/dL     Borderline High:  578 - 199 mg/dL   HDL                       46.96 mg/dL                 >29.52   VLDL Cholesterol          12.0 mg/dL                  8.4-13.2   LDL Cholesterol      [H]  440 mg/dL                   1-02  CHO/HDL Ratio:  CHD Risk                             3                    Men          Women     1/2 Average Risk     3.4          3.3     Average Risk          5.0          4.4  2X Average Risk          9.6          7.1     3X Average Risk          15.0          11.0                            Tests: (2) Hepatic/Liver Function Panel (HEPATIC)   Total Bilirubin           0.7 mg/dL                   1.6-1.0   Direct Bilirubin          0.2 mg/dL                   9.6-0.4   Alkaline Phosphatase      66 U/L                      39-117   AST                       17 U/L                      0-37   ALT                       13 U/L                      0-35   Total Protein             6.5 g/dL                    5.4-0.9   Albumin                   3.7 g/dL                    8.1-1.9  Tests: (3) BMP (METABOL)   Sodium                    142 mEq/L                   135-145   Potassium                 4.8 mEq/L                   3.5-5.1   Chloride                  104 mEq/L                   96-112   Carbon Dioxide            29 mEq/L                    19-32   Glucose                   78 mg/dL                    14-78   BUN                       12 mg/dL  6-23   Creatinine                0.8 mg/dL                   4.0-3.4   Calcium                   9.5 mg/dL                   7.4-25.9   GFR                       86.41 mL/min                >60  Tests: (4) CBC Platelet w/Diff (CBCD)   White Cell Count          6.4 K/uL                    4.5-10.5   Red Cell Count            4.56 Mil/uL                 3.87-5.11   Hemoglobin                13.2 g/dL                   56.3-87.5   Hematocrit                39.3 %                      36.0-46.0   MCV                       86.2 fl                     78.0-100.0   MCHC                      33.6 g/dL                   64.3-32.9   RDW                  [H]  14.9 %                      11.5-14.6   Platelet Count            302.0 K/uL                  150.0-400.0   Neutrophil %              56.5 %                      43.0-77.0   Lymphocyte %              32.8 %                      12.0-46.0   Monocyte %                7.6 %                       3.0-12.0   Eosinophils%  2.7 %                       0.0-5.0    Basophils %               0.4 %                       0.0-3.0   Neutrophill Absolute      3.6 K/uL                    1.4-7.7   Lymphocyte Absolute       2.1 K/uL                    0.7-4.0   Monocyte Absolute         0.5 K/uL                    0.1-1.0  Eosinophils, Absolute                             0.2 K/uL                    0.0-0.7   Basophils Absolute        0.0 K/uL                    0.0-0.1  Tests: (5) TSH (TSH)   FastTSH                   1.34 uIU/mL                 0.35-5.50  Tests: (6) B12 + Folate Panel (B12/FOL)   Vitamin B12               375 pg/mL                   211-911

## 2010-04-15 NOTE — Assessment & Plan Note (Signed)
Summary: BP/ ab   Vital Signs:  Patient profile:   77 year old female Height:      66 inches Weight:      175 pounds BMI:     28.35 O2 Sat:      98 % on Room air Temp:     97.6 degrees F oral Pulse rate:   60 / minute BP sitting:   172 / 80  (left arm) Cuff size:   regular  Vitals Entered By: Bill Salinas CMA (April 18, 2009 2:33 PM)  O2 Flow:  Room air CC: office visit for elevated BP/ ab   Primary Care Provider:  Pennie Banter MD  CC:  office visit for elevated BP/ ab.  History of Present Illness: Patient is seen acutely due to elevated blood pressure. Her last OV Nov 18th BP 188/84 and meds were adjusted with addition of furosemide. F/u nurse visit Nov 23rd - BP 136/80. today Dr. Gabriel Rung checked her BP which was 176/80. Today in the office 172/82. she is asymptomatic.   Current Medications (verified): 1)  Cosopt 2-0.5 %  Soln (Dorzolamide-Timolol) .... One Drop Three Times A Day 2)  Pilopine Hs 4 %  Gel (Pilocarpine Hcl) .... Use in Right Eye At Bedtime 3)  Allegra 60 Mg  Tabs (Fexofenadine Hcl) .... Take 1 Tablet By Mouth Once A Day As Needed For Allergies.Marland KitchenMarland Kitchen 4)  Nasonex 50 Mcg/act  Susp (Mometasone Furoate) .... Use 2 Sprays in Each Nostril At Bedtime.Marland KitchenMarland Kitchen 5)  Pulmicort Flexhaler 180 Mcg/act  Inha (Budesonide (Inhalation)) .... 2 Puffs Two Times A Day 6)  Proair Hfa 108 (90 Base) Mcg/act Aers (Albuterol Sulfate) .... Inhale 2 Sprays Every 6 H As Needed For Wheezing... 7)  Robitussin Dm 100-10 Mg/67ml  Syrp (Dextromethorphan-Guaifenesin) .... As Needed 8)  Cozaar 100 Mg  Tabs (Losartan Potassium) .... Take 1 Tablet By Mouth Once A Day 9)  Furosemide 40 Mg  Tabs (Furosemide) .... Take 1 Tablet By Mouth Once A Day 10)  Zetia 10 Mg  Tabs (Ezetimibe) .... Take 1 Tablet By Mouth Once A Day 11)  Zantac 75 75 Mg Tabs (Ranitidine Hcl) .... Take 2 Tabs Daily... 12)  Clotrimazole-Betamethasone 1-0.05 % Crea (Clotrimazole-Betamethasone) .... Apply To Rash Two Times A Day... 13)   Prilosec Otc 20 Mg Tbec (Omeprazole Magnesium) .Marland Kitchen.. 1 Tab Once Daily 30 Minutes Before Breakfast  Allergies (verified): 1)  ! Codeine  Past History:  Past Medical History: Last updated: 10/23/2008  GLAUCOMA (ICD-365.9) ALLERGY (ICD-995.3) ASTHMA (ICD-493.90) HYPERTENSION (ICD-401.9) HYPERLIPIDEMIA (ICD-272.4) GERD (ICD-530.81) DIVERTICULOSIS OF COLON (ICD-562.10) DIVERTICULAR BLEEDING, HX OF (ICD-V12.79)  HYSTERECTOMY, HX OF (ICD-V45.77) DEGENERATIVE JOINT DISEASE (ICD-715.90) ANXIETY (ICD-300.00)  Past Surgical History: Last updated: 02/23/2008 Hysterectomy-fibroid tumor Bladder repair after perforation Kidney surgery Left Knee arthroscopy '98  Family History: Last updated: 04-Jan-2008 father - deceased @35 : cause of death unkown mother-deceased @ 4: bladder tumors Neg - breast or colon cancer; DM; CAD  Social History: Last updated: 2008-01-04 HSG Married '54 - '87 -widowed 2 sons - ''8, '62; 2 daughters - '59, '61; 9 granddaughters; 28 great-grandchildren 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  Risk Factors: Caffeine Use: 0 (01-04-08) Exercise: no (04-Jan-2008)  Risk Factors: Smoking Status: never (02/23/2008)  Review of Systems  The patient denies anorexia, fever, weight loss, weight gain, vision loss, decreased hearing, hoarseness, chest pain, syncope, dyspnea on exertion, peripheral edema, prolonged cough, headaches, hemoptysis, abdominal pain, melena, hematochezia, severe indigestion/heartburn, hematuria, incontinence, muscle weakness, suspicious skin lesions, transient blindness, difficulty walking,  abnormal bleeding, enlarged lymph nodes, angioedema, and breast masses.    Physical Exam  General:  WNWD AA female in no distress Head:  Normocephalic and atraumatic without obvious abnormalities. No apparent alopecia or balding. Eyes:  normal fundiscopic exam with handheld instrument Lungs:  normal respiratory effort and normal breath sounds.   Heart:  normal  rate.     Impression & Recommendations:  Problem # 1:  HYPERTENSION (ICD-401.9)  BP poor control.  Plan - continue furosemide and cozaar           add amlodipine 5mg  once daily   Her updated medication list for this problem includes:    Cozaar 100 Mg Tabs (Losartan potassium) .Marland Kitchen... Take 1 tablet by mouth once a day    Furosemide 40 Mg Tabs (Furosemide) .Marland Kitchen... Take 1 tablet by mouth once a day    Amlodipine Besylate 5 Mg Tabs (Amlodipine besylate) .Marland Kitchen... 1 by mouth once daily  Orders: Prescription Created Electronically [phone])  Complete Medication List: 1)  Cosopt 2-0.5 % Soln (Dorzolamide-timolol) .... One drop three times a day 2)  Pilopine Hs 4 % Gel (Pilocarpine hcl) .... Use in right eye at bedtime 3)  Allegra 60 Mg Tabs (Fexofenadine hcl) .... Take 1 tablet by mouth once a day as needed for allergies.Marland KitchenMarland Kitchen 4)  Nasonex 50 Mcg/act Susp (Mometasone furoate) .... Use 2 sprays in each nostril at bedtime.Marland KitchenMarland Kitchen 5)  Pulmicort Flexhaler 180 Mcg/act Inha (Budesonide (inhalation)) .... 2 puffs two times a day 6)  Proair Hfa 108 (90 Base) Mcg/act Aers (Albuterol sulfate) .... Inhale 2 sprays every 6 h as needed for wheezing... 7)  Robitussin Dm 100-10 Mg/31ml Syrp (Dextromethorphan-guaifenesin) .... As needed 8)  Cozaar 100 Mg Tabs (Losartan potassium) .... Take 1 tablet by mouth once a day 9)  Furosemide 40 Mg Tabs (Furosemide) .... Take 1 tablet by mouth once a day 10)  Zetia 10 Mg Tabs (Ezetimibe) .... Take 1 tablet by mouth once a day 11)  Zantac 75 75 Mg Tabs (Ranitidine hcl) .... Take 2 tabs daily... 12)  Clotrimazole-betamethasone 1-0.05 % Crea (Clotrimazole-betamethasone) .... Apply to rash two times a day... 13)  Prilosec Otc 20 Mg Tbec (Omeprazole magnesium) .Marland Kitchen.. 1 tab once daily 30 minutes before breakfast 14)  Amlodipine Besylate 5 Mg Tabs (Amlodipine besylate) .Marland Kitchen.. 1 by mouth once daily Prescriptions: AMLODIPINE BESYLATE 5 MG TABS (AMLODIPINE BESYLATE) 1 by mouth once daily  #30 x  12   Entered and Authorized by:   Jacques Navy MD   Signed by:   Jacques Navy MD on 04/18/2009   Method used:   Electronically to        [facility] (retail)       [address]       [place], [place]  [id number]       Ph: [phone]       Fax: [phone]   RxID:   [id number]

## 2010-04-15 NOTE — Assessment & Plan Note (Signed)
Summary: ONE WEEK BPC-LB   Nurse Visit   Vital Signs:  Patient profile:   77 year old female BP sitting:   144 / 72  (left arm)  Vitals Entered By: Lamar Sprinkles, CMA (April 26, 2009 2:20 PM) Comments Pt in office for BP check after being on amlodipine x 1 wk, no complaints. Dr aware, and is happy with bp reading.    Allergies: 1)  ! Codeine  Orders Added: 1)  Est. Patient Level I [[address]]

## 2010-04-15 NOTE — Progress Notes (Signed)
Summary: cough  Phone Note Call from Patient   Summary of Call: Patient called c/o cough and would like something called in.  She will call Dr. Kriste Basque to request and call back if needed. Initial call taken by: Lucious Groves,  May 24, 2009 11:47 AM  Follow-up for Phone Call        No return call from patient, ended note. Follow-up by: Lucious Groves,  May 24, 2009 4:52 PM

## 2010-04-15 NOTE — Progress Notes (Signed)
Summary: prescript  Phone Note Call from Patient   Caller: Patient Call For: [name] Summary of Call: need refill for zetia [facility] Initial call taken by: Rickard Patience,  March 18, 2009 3:47 PM  Follow-up for Phone Call        Pt has not seen SN since 02-2008 and has no upcoming appt. I advised pt to check with PCP (Norins) to have the Zetia refilled due to she last saw him 01-2009. Pt transfer to the front desk to schedule same. Zackery Barefoot CMA  March 18, 2009 4:02 PM

## 2010-04-15 NOTE — Discharge Summary (Signed)
NAME:  [name], [name]              ACCOUNT NO.:  [id number]      MEDICAL RECORD NO.:  [id number]          PATIENT TYPE:  INP      LOCATION:  5121                         FACILITY:  [facility]      PHYSICIAN:  Valetta Mole. Swords, MD    DATE OF BIRTH:  [birth year]      DATE OF ADMISSION:  11/04/2007   DATE OF DISCHARGE:  11/06/2007                                  DISCHARGE SUMMARY      DISCHARGE DIAGNOSES:   1. Lower gastrointestinal bleed.   2. Anemia, acute blood loss.   3. Presumed diverticular bleed.   4. Hypertension.   5. Hyperlipidemia.   6. Glaucoma.   7. Allergic rhinitis.      DISCHARGE MEDICATIONS:   1. Cozaar 100 mg p.o. daily.   2. Lasix 40 mg p.o. daily.   3. Timolol eye drops 1 drop each eye t.i.d.   4. Zetia 10 mg p.o. daily.   5. Cozaar 100 mg p.o. daily.   6. Allegra 60 mg p.o. daily.      HOSPITAL PROCEDURES:  None.      HOSPITAL CONSULTS:  Gastroenterology.      HOSPITAL LABORATORIES:  Hemoglobin at the time of discharge was 10.2 and   was stable.  Hemoglobin at the time of admission was 12.0.  Fecal occult   blood was positive.  CMET was unremarkable except for glucose of 101.   Blood type is O+.      HOSPITAL COURSE:  The patient was admitted to the [facility] on   November 04, 2007.  See Dr. Vernon Prey note for details.  The patient   presented with history compatible with lower GI bleed.  The patient was   hospitalized and seen by Gastroenterology.  Colonoscopy is apparently up   to date.  No further recommendation made, as the patient's bleeding   stopped spontaneously.  Hemoglobin remained stable.      FOLLOWUP PLANS:  Dr. Debby Bud as already scheduled for this Friday.      CONDITION ON DISCHARGE:  Improved.               Bruce Rexene Edison Swords, MD   Electronically Signed            [facility]/MEDQ  D:  11/06/2007  T:  11/06/2007  Job:  [id number]

## 2010-04-15 NOTE — Assessment & Plan Note (Signed)
Summary: bones are achey/#/cd   Vital Signs:  Patient profile:   77 year old female Height:      66 inches (167.64 cm) Weight:      172.50 pounds (78.41 kg) BMI:     27.94 O2 Sat:      97 % on Room air Temp:     97.4 degrees F (36.33 degrees C) oral Pulse rate:   59 / minute BP sitting:   150 / 72  (left arm) Cuff size:   regular  Vitals Entered By: Lucious Groves (September 12, 2009 11:06 AM)  O2 Flow:  Room air CC: C/O achey bones and uncomfortable areas in her breast x2 weeks./kb Is Patient Diabetic? No Pain Assessment Patient in pain? no      Comments Patient notes that the left breast is worse than the right, and that she is not taking Cerufoxime./kb   Primary Care Provider:  Pennie Banter MD  CC:  C/O achey bones and uncomfortable areas in her breast x2 weeks./kb.  History of Present Illness: Patient presents for evaluation of pain in the anterior axilla left and right. She also has had anterior chest wall pain on the left just inferior to the breast. She is worried about serious under-lying disease. She has had no night sweats, weight loss, fevers. She recalls no inciting activity or injury.   Current Medications (verified): 1)  Cosopt 2-0.5 %  Soln (Dorzolamide-Timolol) .... One Drop Three Times A Day 2)  Pilopine Hs 4 %  Gel (Pilocarpine Hcl) .... Use in Right Eye At Bedtime 3)  Allegra 60 Mg  Tabs (Fexofenadine Hcl) .... Take 1-2 Tablet By Mouth Once A Day As Needed For Allergies.Marland KitchenMarland Kitchen 4)  Nasonex 50 Mcg/act  Susp (Mometasone Furoate) .... Use 2 Sprays in Each Nostril At Bedtime.Marland KitchenMarland Kitchen 5)  Pulmicort Flexhaler 180 Mcg/act  Inha (Budesonide (Inhalation)) .... 2 Puffs Two Times A Day 6)  Proair Hfa 108 (90 Base) Mcg/act Aers (Albuterol Sulfate) .... Inhale 2 Sprays Every 6 H As Needed For Wheezing... 7)  Robitussin Dm 100-10 Mg/3ml  Syrp (Dextromethorphan-Guaifenesin) .... As Needed 8)  Bystolic 10 Mg Tabs (Nebivolol Hcl) .... Take 1 Tablet By Mouth Once A Day 9)  Amlodipine  Besylate 5 Mg Tabs (Amlodipine Besylate) .Marland Kitchen.. 1 By Mouth Once Daily 10)  Cozaar 100 Mg  Tabs (Losartan Potassium) .... Take 1 Tablet By Mouth Once A Day 11)  Furosemide 40 Mg  Tabs (Furosemide) .... Take 1 Tablet By Mouth Once A Day 12)  Zetia 10 Mg  Tabs (Ezetimibe) .... Take 1 Tablet By Mouth Once A Day 13)  Prilosec Otc 20 Mg Tbec (Omeprazole Magnesium) .Marland Kitchen.. 1 Tab Once Daily 30 Minutes Before Breakfast 14)  Promethazine-Codeine 6.25-10 Mg/31ml Syrp (Promethazine-Codeine) .Marland Kitchen.. 1 Tsp Q 6 15)  Prednisone (Pak) 5 Mg Tabs (Prednisone) .... Take As Directed Til Gone... 16)  Cefuroxime Axetil 250 Mg Tabs (Cefuroxime Axetil) .... Take 1 Tab By Mouth Two Times A Day Til Gone... 17)  Mucinex 600 Mg Xr12h-Tab (Guaifenesin) .... Take 1-2 Tabs By Mouth Two Times A Day W/ Plenty of Fluids...  Allergies (verified): 1)  ! Codeine  Past History:  Past Medical History: Last updated: 08/06/2009 GLAUCOMA (ICD-365.9) ALLERGY (ICD-995.3) ASTHMA (ICD-493.90) HYPERTENSION (ICD-401.9) HYPERLIPIDEMIA (ICD-272.4) GERD (ICD-530.81) DIVERTICULOSIS OF COLON (ICD-562.10) DIVERTICULAR BLEEDING, HX OF (ICD-V12.79) HYSTERECTOMY, HX OF (ICD-V45.77) DEGENERATIVE JOINT DISEASE (ICD-715.90) ANXIETY (ICD-300.00)  Past Surgical History: Last updated: 08/06/2009 Hysterectomy-fibroid tumor Bladder repair after perforation Kidney surgery Left Knee arthroscopy '98 PSH reviewed  for relevance, FH reviewed for relevance  Review of Systems  The patient denies anorexia, fever, weight loss, chest pain, syncope, dyspnea on exertion, and abdominal pain.    Physical Exam  General:  WNWD mildly overweight AA female in no distress Chest Wall:  very tender at the tail of the pectoralis major/minor bilaterally. No mass or tumor. Tender in the intercostal space mid-clavicular line left between ribs 6-7 Breasts:  normal with no fixed mass or lesion Lungs:  normal respiratory effort and normal breath sounds.   Heart:  normal  rate and regular rhythm.   Msk:  no joint swelling, no joint warmth, and no joint instability.     Impression & Recommendations:  Problem # 1:  CHEST WALL PAIN, ANTERIOR (ICD-786.52) No mass or tumor noted. No axillary adenopathy. Normal skin. Pain is from muscle inflammation.  Plan Voltaren gel qid to tender places until better or 1 week.  Complete Medication List: 1)  Cosopt 2-0.5 % Soln (Dorzolamide-timolol) .... One drop three times a day 2)  Pilopine Hs 4 % Gel (Pilocarpine hcl) .... Use in right eye at bedtime 3)  Allegra 60 Mg Tabs (Fexofenadine hcl) .... Take 1-2 tablet by mouth once a day as needed for allergies.Marland KitchenMarland Kitchen 4)  Nasonex 50 Mcg/act Susp (Mometasone furoate) .... Use 2 sprays in each nostril at bedtime.Marland KitchenMarland Kitchen 5)  Pulmicort Flexhaler 180 Mcg/act Inha (Budesonide (inhalation)) .... 2 puffs two times a day 6)  Proair Hfa 108 (90 Base) Mcg/act Aers (Albuterol sulfate) .... Inhale 2 sprays every 6 h as needed for wheezing... 7)  Bystolic 10 Mg Tabs (Nebivolol hcl) .... Take 1 tablet by mouth once a day 8)  Amlodipine Besylate 5 Mg Tabs (Amlodipine besylate) .Marland Kitchen.. 1 by mouth once daily 9)  Cozaar 100 Mg Tabs (Losartan potassium) .... Take 1 tablet by mouth once a day 10)  Furosemide 40 Mg Tabs (Furosemide) .... Take 1 tablet by mouth once a day 11)  Zetia 10 Mg Tabs (Ezetimibe) .... Take 1 tablet by mouth once a day 12)  Prilosec Otc 20 Mg Tbec (Omeprazole magnesium) .Marland Kitchen.. 1 tab once daily 30 minutes before breakfast 13)  Voltaren 1 % Gel (Diclofenac sodium) .... Apply 4 g to sore area(s) 4 times a day as needed  Patient Instructions: 1)  chest pain - there is tendernes in the tail of the pectoralis major and minor muscles. No enlarged lymph nodes, no turmors. Plan - apply voltaren gel to the sore areas four times a day until better or up to 1 week. May also apply heat inbetween treatments.  Prescriptions: VOLTAREN 1 % GEL (DICLOFENAC SODIUM) apply 4 g to sore area(s) 4 times a day as  needed  #100g x 4   Entered and Authorized by:   Jacques Navy MD   Signed by:   Jacques Navy MD on 09/12/2009   Method used:   Electronically to        [facility] [address] (retail)       [address]       [place], [place]  [id number]       Ph: [phone]       Fax: [phone]   RxID:   [id number]

## 2010-04-15 NOTE — Assessment & Plan Note (Signed)
Summary: NP follow up - asthma   Primary Provider/Referring Provider:  Pennie Banter MD -PMD, Dr Alroy Dust - Pulmonary  CC:  10day follow up - states symptoms have resolved.  finished pred taper yesterday.Marland Kitchen  History of Present Illness: OV 10/07/2009. KNown asthmatic. Generally well controlled on pulmicort 2 puff two times a day with reported good compliance. Last seen 08/06/2009 acutely for acute asthma exacerbtion. Did well after that with abx and pred taper. However, now for past 2 weeks reporting increaseing dry cough and dyspnea. Dyspnea is worse than cough. Dyspnea is progressive but cough is not. Associatd wheezing present. Rates symptoms as moderate in severity. Normallynot dyspneic doing household work but today working at Dr[employer] she wsa dyspneic with stair climbing. Resting wheeze present. Symptoms c/w prior asthma attacks but she is unsure what triggered it. She has been upset about a friend's death from a year ago as May-July marks 1  year anniversary. Also weather is very hot and humid.   She has been compliant with pulmicort but I am not sure she understands what the pro-air is meant for. enies associated fever, nocturnal awakening, hemoptysis, chills, runny nose, hemoptysis, edema, chest pain, palpitations, dizziness, syncope. REcollects normal stress test a year ago  October 21, 2009--Returns for follow up. Last visit w/ asthmatic bronchitis flare-tx w/ avelox and prednisone taper. She is feeling better. Her wheezing and cough have resolved. Denies chest pain, dyspnea, orthopnea, hemoptysis, fever, n/v/d, edema, headache. she likes symbicort lot. Dyspnea is less on this.  Medications Prior to Update: 1)  Cosopt 2-0.5 %  Soln (Dorzolamide-Timolol) .... One Drop Three Times A Day 2)  Pilopine Hs 4 %  Gel (Pilocarpine Hcl) .... Use in Right Eye At Bedtime 3)  Allegra 60 Mg  Tabs (Fexofenadine Hcl) .... Take 1-2 Tablet By Mouth Once A Day As Needed For Allergies.Marland KitchenMarland Kitchen 4)   Nasonex 50 Mcg/act  Susp (Mometasone Furoate) .... Use 2 Sprays in Each Nostril At Bedtime.Marland KitchenMarland Kitchen 5)  Symbicort 160-4.5 Mcg/act  Aero (Budesonide-Formoterol Fumarate) .... Two Puffs Twice Daily 6)  Proair Hfa 108 (90 Base) Mcg/act Aers (Albuterol Sulfate) .... Inhale 2 Sprays Every 6 H As Needed For Wheezing... 7)  Bystolic 10 Mg Tabs (Nebivolol Hcl) .... Take 1 Tablet By Mouth Once A Day 8)  Amlodipine Besylate 5 Mg Tabs (Amlodipine Besylate) .Marland Kitchen.. 1 By Mouth Once Daily 9)  Cozaar 100 Mg  Tabs (Losartan Potassium) .... Take 1 Tablet By Mouth Once A Day 10)  Furosemide 40 Mg  Tabs (Furosemide) .... Take 1 Tablet By Mouth Once A Day 11)  Zetia 10 Mg  Tabs (Ezetimibe) .... Take 1 Tablet By Mouth Once A Day 12)  Prilosec Otc 20 Mg Tbec (Omeprazole Magnesium) .Marland Kitchen.. 1 Tab Once Daily 30 Minutes Before Breakfast 13)  Aerochamber Plus  Misc (Spacer/aero-Holding Chambers) .... Use As Directed  Current Medications (verified): 1)  Cosopt 2-0.5 %  Soln (Dorzolamide-Timolol) .... One Drop Three Times A Day 2)  Pilopine Hs 4 %  Gel (Pilocarpine Hcl) .... Use in Right Eye At Bedtime 3)  Allegra 60 Mg  Tabs (Fexofenadine Hcl) .... Take 1-2 Tablet By Mouth Once A Day As Needed For Allergies.Marland KitchenMarland Kitchen 4)  Nasonex 50 Mcg/act  Susp (Mometasone Furoate) .... Use 2 Sprays in Each Nostril At Bedtime.Marland KitchenMarland Kitchen 5)  Symbicort 160-4.5 Mcg/act  Aero (Budesonide-Formoterol Fumarate) .... Two Puffs Twice Daily 6)  Proair Hfa 108 (90 Base) Mcg/act Aers (Albuterol Sulfate) .... Inhale 2 Sprays Every 6 H As  Needed For Wheezing... 7)  Bystolic 10 Mg Tabs (Nebivolol Hcl) .... Take 1 Tablet By Mouth Once A Day 8)  Amlodipine Besylate 5 Mg Tabs (Amlodipine Besylate) .Marland Kitchen.. 1 By Mouth Once Daily 9)  Cozaar 100 Mg  Tabs (Losartan Potassium) .... Take 1 Tablet By Mouth Once A Day 10)  Furosemide 40 Mg  Tabs (Furosemide) .... Take 1 Tablet By Mouth Once A Day 11)  Zetia 10 Mg  Tabs (Ezetimibe) .... Take 1 Tablet By Mouth Once A Day 12)  Prilosec Otc 20 Mg  Tbec (Omeprazole Magnesium) .Marland Kitchen.. 1 Tab Once Daily 30 Minutes Before Breakfast 13)  Aerochamber Plus  Misc (Spacer/aero-Holding Chambers) .... Use As Directed  Allergies (verified): 1)  ! Codeine  Past History:  Past Medical History: Last updated: 08/06/2009 GLAUCOMA (ICD-365.9) ALLERGY (ICD-995.3) ASTHMA (ICD-493.90) HYPERTENSION (ICD-401.9) HYPERLIPIDEMIA (ICD-272.4) GERD (ICD-530.81) DIVERTICULOSIS OF COLON (ICD-562.10) DIVERTICULAR BLEEDING, HX OF (ICD-V12.79) HYSTERECTOMY, HX OF (ICD-V45.77) DEGENERATIVE JOINT DISEASE (ICD-715.90) ANXIETY (ICD-300.00)  Past Surgical History: Last updated: 08/06/2009 Hysterectomy-fibroid tumor Bladder repair after perforation Kidney surgery Left Knee arthroscopy '98  Family History: Last updated: January 08, 2008 father - deceased @35 : cause of death unkown mother-deceased @ 36: bladder tumors Neg - breast or colon cancer; DM; CAD  Social History: Last updated: 01/08/2008 HSG Married '54 - '87 -widowed 2 sons - ''28, '62; 2 daughters - '59, '61; 9 granddaughters; 100 great-grandchildren 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  Risk Factors: Caffeine Use: 0 (10/07/2009) Exercise: no (10/07/2009)  Risk Factors: Smoking Status: never (10/07/2009)  Review of Systems      See HPI  Vital Signs:  Patient profile:   77 year old female Height:      66 inches Weight:      176 pounds BMI:     28.51 O2 Sat:      99 % on Room air Temp:     98.8 degrees F oral Pulse rate:   56 / minute BP sitting:   140 / 78  (left arm) Cuff size:   regular  Vitals Entered By: Boone Master CNA/MA (October 21, 2009 9:26 AM)  O2 Flow:  Room air CC: 10day follow up - states symptoms have resolved.  finished pred taper yesterday. Is Patient Diabetic? No Comments Medications reviewed with patient Daytime contact number verified with patient. Boone Master CNA/MA  October 21, 2009 9:26 AM    Physical Exam  Additional Exam:  GEN: A/Ox3; pleasant ,  NAD HEENT:  [place]/AT, , EACs-clear, TMs-wnl, NOSE-clear, THROAT-clear NECK:  Supple w/ fair ROM; no JVD; normal carotid impulses w/o bruits; no thyromegaly or nodules palpated; no lymphadenopathy. RESP  Clear to P & A; w/o, wheezes/ rales/ or rhonchi. CARD:  RRR, no m/r/g   GI:   Soft & nt; nml bowel sounds; no organomegaly or masses detected. Musco: Warm bil,  no calf tenderness edema, clubbing, pulses intact Neuro: intact w/ no focal deficit noted.    Pulmonary Function Test Date: 10/21/2009 09:34 AM Gender: Female  Pre-Spirometry FVC    Value: 7.70 L/min   % Pred: 316.20 % FEV1    Value: 6.75 L     Pred: 1.89 L     % Pred: 357.70 % FEV1/FVC  Value: 87.64 %     % Pred: 114.50 %  Impression & Recommendations:  Problem # 1:  ASTHMA UNSPECIFIED (ICD-493.90)  Recent flare - now resolved w/ improved control on Symbicort  follow up Dr. Kriste Basque in 3 mon.  Her updated medication list for  this problem includes:    Symbicort 160-4.5 Mcg/act Aero (Budesonide-formoterol fumarate) .Marland Kitchen..Marland Kitchen Two puffs twice daily    Proair Hfa 108 (90 Base) Mcg/act Aers (Albuterol sulfate) ..... Inhale 2 sprays every 6 h as needed for wheezing...  Orders: Est. Patient Level II (21308)  Complete Medication List: 1)  Cosopt 2-0.5 % Soln (Dorzolamide-timolol) .... One drop three times a day 2)  Pilopine Hs 4 % Gel (Pilocarpine hcl) .... Use in right eye at bedtime 3)  Allegra 60 Mg Tabs (Fexofenadine hcl) .... Take 1-2 tablet by mouth once a day as needed for allergies.Marland KitchenMarland Kitchen 4)  Nasonex 50 Mcg/act Susp (Mometasone furoate) .... Use 2 sprays in each nostril at bedtime.Marland KitchenMarland Kitchen 5)  Symbicort 160-4.5 Mcg/act Aero (Budesonide-formoterol fumarate) .... Two puffs twice daily 6)  Proair Hfa 108 (90 Base) Mcg/act Aers (Albuterol sulfate) .... Inhale 2 sprays every 6 h as needed for wheezing... 7)  Bystolic 10 Mg Tabs (Nebivolol hcl) .... Take 1 tablet by mouth once a day 8)  Amlodipine Besylate 5 Mg Tabs (Amlodipine besylate) .Marland Kitchen.. 1  by mouth once daily 9)  Cozaar 100 Mg Tabs (Losartan potassium) .... Take 1 tablet by mouth once a day 10)  Furosemide 40 Mg Tabs (Furosemide) .... Take 1 tablet by mouth once a day 11)  Zetia 10 Mg Tabs (Ezetimibe) .... Take 1 tablet by mouth once a day 12)  Prilosec Otc 20 Mg Tbec (Omeprazole magnesium) .Marland Kitchen.. 1 tab once daily 30 minutes before breakfast 13)  Aerochamber Plus Misc (Spacer/aero-holding chambers) .... Use as directed  Patient Instructions: 1)  Continue on Symbicort 160/4.28mcg 2 puffs two times a day  2)  Only use ProAir inhaler as needed for rescue for wheezing and shortness of breath.  3)  follow up Dr. Kriste Basque in 3-4 months  4)  Please contact office for sooner follow up if symptoms do not improve or worsen      CardioPerfect Spirometry  ID: [id number] Patient: BRIGITTE, HOPP DOB: 1934/01/28 Age: 77 Years Old Sex: Female Race: Black Physician: Nikka Hakimian Height: 66 Weight: 176 Smoker: No Status: Unconfirmed Past Medical History:  GLAUCOMA (ICD-365.9) ALLERGY (ICD-995.3) ASTHMA (ICD-493.90) HYPERTENSION (ICD-401.9) HYPERLIPIDEMIA (ICD-272.4) GERD (ICD-530.81) DIVERTICULOSIS OF COLON (ICD-562.10) DIVERTICULAR BLEEDING, HX OF (ICD-V12.79) HYSTERECTOMY, HX OF (ICD-V45.77) DEGENERATIVE JOINT DISEASE (ICD-715.90) ANXIETY (ICD-300.00)  Recorded: 10/21/2009 09:34 AM  Parameter  Measured Predicted %Predicted FVC     7.70        2.44        316.20 FEV1     6.75        1.89        357.70 FEV1%   87.64        76.57        114.50 PEF    10.88        4.79        227.20   Interpretation:

## 2010-04-15 NOTE — Letter (Signed)
Summary: Su Philomena Doheny MD  Su Philomena Doheny MD   Imported By: Sherian Rein 01/09/2010 13:40:42  _____________________________________________________________________  External Attachment:    Type:   Image     Comment:   External Document

## 2010-04-15 NOTE — Progress Notes (Signed)
Summary: refill  Phone Note Call from Patient   Summary of Call: Patient is requesting a call back regarding a prescription.  Initial call taken by: Lamar Sprinkles, CMA,  March 18, 2009 4:22 PM  Follow-up for Phone Call        Spoke with patient who requested a refill on zetia. Rx sent to [facility] with 3rf to last until nest appt . Follow-up by: Rock Nephew CMA,  March 18, 2009 5:00 PM    Prescriptions: ZETIA 10 MG  TABS (EZETIMIBE) Take 1 tablet by mouth once a day  #30 Tablet x 3   Entered by:   Rock Nephew CMA   Authorized by:   Jacques Navy MD   Signed by:   Rock Nephew CMA on 03/18/2009   Method used:   Electronically to        [facility] (retail)       [address]       [place], [place]  [id number]       Ph: [phone]       Fax: [phone]   RxID:   [id number]

## 2010-04-15 NOTE — Op Note (Signed)
Summary: Su Philomena Doheny MD  Su Philomena Doheny MD   Imported By: Sherian Rein 02/14/2010 08:47:02  _____________________________________________________________________  External Attachment:    Type:   Image     Comment:   External Document

## 2010-04-15 NOTE — Progress Notes (Signed)
  Phone Note Outgoing Call   Reason for Call: Get patient information Summary of Call: please call patient: is laryngitis better of symbicort. If it is she can rechallenge herself with symbicort to see if symptoms return.  Thanks Initial call taken by: Jacques Navy MD,  November 09, 2009 12:05 PM  Follow-up for Phone Call        Pts symptoms are a little better, she states she does not feel well (fatigue and weakness) . Pt states she does not want to go back on symbicort. Pt states she forgot to mention at her visit last week that she did hit her head while making her bed. She wants to know if this could be one of the reasons she is not feeling well? Follow-up by: Ami Bullins CMA,  November 11, 2009 9:56 AM  Additional Follow-up for Phone Call Additional follow up Details #1::        doubt hitting her head will make her feel bad. If she is not feeling well I want to see her for follow-up. Thanks Additional Follow-up by: Jacques Navy MD,  November 11, 2009 12:18 PM    Additional Follow-up for Phone Call Additional follow up Details #2::    Spoke with pt and she states she did not hit her head that something came down and hit her in the head. She is coming in for OV on wens. Follow-up by: Ami Bullins CMA,  November 11, 2009 1:31 PM  Additional Follow-up for Phone Call Additional follow up Details #3:: Details for Additional Follow-up Action Taken: k

## 2010-04-15 NOTE — Assessment & Plan Note (Signed)
Summary: CONGESTED WITH COUGH/FEELS WEAK/LA   Primary Care Provider:  Pennie Banter MD  CC:  3 month ROV & add-on for bronchitis....  History of Present Illness: 77 y/o BF here for a follow up visit... 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 she sees DrNorins for primary care and Dr Presley Raddle for GI... we have attended her for Pulm issues including allergic rhinitis and asthma- doing well on Pulmicort, Albuterol, Allegra, and Nasonex...   ~  February 23, 2008:  from the pulm standpoint she has had a good yr... no asthma exas and she's been taking the Pulmicort regularly... states she uses the Albuterol on occas but it is not entirely clear why?... denies cough, sputum, hemoptysis, worsening dyspnea,  wheezing, chest pains, snoring, daytime hypersomnolence, etc...    ~  May 08, 2009:  she has had some trouble w/ her BP & DrNorins has been adjusting her meds- now under better control... she is feeling well overall- denies any pulm exacerbations, cough, sputum, CP, or incr dyspnea... she has been too sedentary w/ DOE noted but not really progressive... we discussed the need for PulmRehab vs exercising on her own... she has some nasal drainage & uses the Allegra/ Nasonex w/ varying degrees of success... she has been regular w/ her Pulmicort & we discussed taking the Proair 10-58min prior to the flexhaler...   ~  Aug 06, 2009:  DrJoe called requesting urgent appt for [name] w/ 4d hx ST, cough, chest congestion, beige sputum, weakness & not resting well... she denies f/c/s, CP, hemoptysis- does note sl dyspnea... she had CXR 2/11 which was clear, NAD... exam reveals few rhonchi/ wheezes c/w AB & we discussed Rx w/ Deop/ Dosepak/ Ceftin/ Mucinex/ cough syrup...   Current Problems:  GLAUCOMA (ICD-365.9) - on drops from DrShapiro... ALLERGY (ICD-995.3) - on ALLEGRA 60mg  in AM & NASONEX 2 sp at bedtime... ASTHMA (ICD-493.90) - as above on PULMICORT 2 spBid, and PROAIR  Prn... HYPERTENSION (ICD-401.9) - on COZAAR 100mg /d, LASIX 40mg /d, BYSTOLIC 10mg /d, AMLODIPINE 5mg /d. HYPERLIPIDEMIA (ICD-272.4) - on ZETIA 10mg /d... GERD (ICD-530.81) - on PRILOSEC OTC 20mg /d... DIVERTICULOSIS OF COLON (ICD-562.10) DIVERTICULAR BLEEDING, HX OF (ICD-V12.79) - she is off ASA now and on Fe... Family Hx of COLON CANCER (ICD-153.9) HYSTERECTOMY, HX OF (ICD-V45.77) DEGENERATIVE JOINT DISEASE (ICD-715.90) ANXIETY (ICD-300.00)   Allergies: 1)  ! Codeine  Comments:  Nurse/Medical Assistant: The patient's medications and allergies were reviewed with the patient and were updated in the Medication and Allergy Lists.  Past History:  Past Medical History: GLAUCOMA (ICD-365.9) ALLERGY (ICD-995.3) ASTHMA (ICD-493.90) HYPERTENSION (ICD-401.9) HYPERLIPIDEMIA (ICD-272.4) GERD (ICD-530.81) DIVERTICULOSIS OF COLON (ICD-562.10) DIVERTICULAR BLEEDING, HX OF (ICD-V12.79) HYSTERECTOMY, HX OF (ICD-V45.77) DEGENERATIVE JOINT DISEASE (ICD-715.90) ANXIETY (ICD-300.00)  Past Surgical History: Hysterectomy-fibroid tumor Bladder repair after perforation Kidney surgery Left Knee arthroscopy '98  Family History: Reviewed history from 12/12/2007 and no changes required. father - deceased @35 : cause of death unkown mother-deceased @ 43: bladder tumors Neg - breast or colon cancer; DM; CAD  Social History: Reviewed history from 12/12/2007 and no changes required. 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  Review of Systems      See HPI       The patient complains of decreased hearing, dyspnea on exertion, and prolonged cough.  The patient denies anorexia, fever, weight loss, weight gain, vision loss, hoarseness, chest pain, syncope, peripheral edema, headaches, hemoptysis, abdominal  pain, melena, hematochezia, severe indigestion/heartburn, hematuria, incontinence, muscle  weakness, suspicious skin lesions, transient blindness, difficulty walking, depression, unusual weight change, abnormal bleeding, enlarged lymph nodes, and angioedema.    Vital Signs:  Patient profile:   77 year old female Height:      66 inches Weight:      173 pounds O2 Sat:      95 % on Room air Temp:     99.3 degrees F oral Pulse rate:   66 / minute BP sitting:   132 / 66  (right arm) Cuff size:   regular  Vitals Entered By: Randell Loop CMA (Aug 06, 2009 3:40 PM)  O2 Sat at Rest %:  95 O2 Flow:  Room air CC: 3 month ROV & add-on for bronchitis... Is Patient Diabetic? No Pain Assessment Patient in pain? no      Comments meds updated today with pt   Physical Exam  Additional Exam:  WD, WN, 77 y/o BF in NAD... GENERAL:  Alert & oriented; pleasant & cooperative... HEENT:  [place]/AT, EOM-wnl, PERRLA, EACs-clear, TMs-wnl, NOSE-clear, THROAT-clear & wnl. NECK:  Supple w/ fairROM; no JVD; normal carotid impulses w/o bruits; no thyromegaly or nodules palpated; no lymphadenopathy. CHEST:  Decr BS bilat w/ scat rhonchi & end-exp wheezing... no signs of consolidation... HEART:  Regular Rhythm; without murmurs/ rubs/ or gallops... ABDOMEN:  Soft & nontender; normal bowel sounds; no organomegaly or masses detected. EXT: without deformities, mild arthritic changes; no varicose veins/ +venous insuffic/ no edema. NEURO:  CN's intact; no focal neuro deficits... DERM:  No lesions noted; no rash etc...    Impression & Recommendations:  Problem # 1:  ACUTE BRONCHITIS (ICD-466.0) She has a bronchitic infection and some airway inflamm>  AB...  We discussed Rx w/ Depo80, Pred Dosepak, Cefuroxime 250Bid, Mucinex 1-2 Bid, Fluids, cough syrup Prn... Continue her Pulmicort Bid & Proair... Her updated medication list for this problem includes:    Pulmicort Flexhaler 180 Mcg/act Inha (Budesonide (inhalation)) .Marland Kitchen... 2 puffs two times a day    Proair Hfa 108 (90 Base) Mcg/act Aers (Albuterol  sulfate) ..... Inhale 2 sprays every 6 h as needed for wheezing...    Robitussin Dm 100-10 Mg/7ml Syrp (Dextromethorphan-guaifenesin) .Marland Kitchen... As needed    Promethazine-codeine 6.25-10 Mg/81ml Syrp (Promethazine-codeine) .Marland Kitchen... 1 tsp q 6    Cefuroxime Axetil 250 Mg Tabs (Cefuroxime axetil) .Marland Kitchen... Take 1 tab by mouth two times a day til gone...    Mucinex 600 Mg Xr12h-tab (Guaifenesin) .Marland Kitchen... Take 1-2 tabs by mouth two times a day w/ plenty of fluids...  Problem # 2:  HYPERTENSION (ICD-401.9) Controlled>  same meds. Her updated medication list for this problem includes:    Bystolic 10 Mg Tabs (Nebivolol hcl) .Marland Kitchen... Take 1 tablet by mouth once a day    Amlodipine Besylate 5 Mg Tabs (Amlodipine besylate) .Marland Kitchen... 1 by mouth once daily    Cozaar 100 Mg Tabs (Losartan potassium) .Marland Kitchen... Take 1 tablet by mouth once a day    Furosemide 40 Mg Tabs (Furosemide) .Marland Kitchen... Take 1 tablet by mouth once a day  Problem # 3:  HYPERLIPIDEMIA (ICD-272.4) Followed by DrNorins on Zetia... Her updated medication list for this problem includes:    Zetia 10 Mg Tabs (Ezetimibe) .Marland Kitchen... Take 1 tablet by mouth once a day  Problem # 4:  DIVERTICULOSIS-COLON (ICD-562.10) GI appears stable...  Problem # 5:  OTHER MEDICAL ISSUES AS NOTED>>>  Complete Medication List: 1)  Cosopt 2-0.5 % Soln (Dorzolamide-timolol) .... One drop three  times a day 2)  Pilopine Hs 4 % Gel (Pilocarpine hcl) .... Use in right eye at bedtime 3)  Allegra 60 Mg Tabs (Fexofenadine hcl) .... Take 1-2 tablet by mouth once a day as needed for allergies.Marland KitchenMarland Kitchen 4)  Nasonex 50 Mcg/act Susp (Mometasone furoate) .... Use 2 sprays in each nostril at bedtime.Marland KitchenMarland Kitchen 5)  Pulmicort Flexhaler 180 Mcg/act Inha (Budesonide (inhalation)) .... 2 puffs two times a day 6)  Proair Hfa 108 (90 Base) Mcg/act Aers (Albuterol sulfate) .... Inhale 2 sprays every 6 h as needed for wheezing... 7)  Robitussin Dm 100-10 Mg/77ml Syrp (Dextromethorphan-guaifenesin) .... As needed 8)  Bystolic 10 Mg  Tabs (Nebivolol hcl) .... Take 1 tablet by mouth once a day 9)  Amlodipine Besylate 5 Mg Tabs (Amlodipine besylate) .Marland Kitchen.. 1 by mouth once daily 10)  Cozaar 100 Mg Tabs (Losartan potassium) .... Take 1 tablet by mouth once a day 11)  Furosemide 40 Mg Tabs (Furosemide) .... Take 1 tablet by mouth once a day 12)  Zetia 10 Mg Tabs (Ezetimibe) .... Take 1 tablet by mouth once a day 13)  Prilosec Otc 20 Mg Tbec (Omeprazole magnesium) .Marland Kitchen.. 1 tab once daily 30 minutes before breakfast 14)  Promethazine-codeine 6.25-10 Mg/26ml Syrp (Promethazine-codeine) .Marland Kitchen.. 1 tsp q 6 15)  Prednisone (pak) 5 Mg Tabs (Prednisone) .... Take as directed til gone... 16)  Cefuroxime Axetil 250 Mg Tabs (Cefuroxime axetil) .... Take 1 tab by mouth two times a day til gone... 17)  Mucinex 600 Mg Xr12h-tab (Guaifenesin) .... Take 1-2 tabs by mouth two times a day w/ plenty of fluids...  Patient Instructions: 1)  Today we updated your med list- see below.... 2)  For your Bronchitis:  Start the antibiotic= CEFUROXIME one tab twice daily x 7d til gone... we gave you a Depo shot for the inflammation, and wrote a new perscription for a Prednisone dosepak= start this in the AM 5/25 & take as directed... 3)  You may also use the OTC MUCINEX 600mg = 1-2 twice daily w/ lots of water... 4)  OK to use the cough syrups that you have at home... 5)  Rest at home & take Tylenol as needed... 6)  Call for any questions... Prescriptions: CEFUROXIME AXETIL 250 MG TABS (CEFUROXIME AXETIL) take 1 tab by mouth two times a day til gone...  #14 x 0   Entered and Authorized by:   Michele Mcalpine MD   Signed by:   Michele Mcalpine MD on 08/06/2009   Method used:   Print then Give to Patient   RxID:   [id number] PREDNISONE (PAK) 5 MG TABS (PREDNISONE) Take as directed til gone...  #one 6d pack x 0   Entered and Authorized by:   Michele Mcalpine MD   Signed by:   Michele Mcalpine MD on 08/06/2009   Method used:   Print then Give to Patient   RxID:    [id number]

## 2010-04-15 NOTE — Progress Notes (Signed)
  Phone Note Call from Patient Call back at [facility] Phone [phone]   Summary of Call: Patient is requesting a call back regarding laryngitis.  Initial call taken by: Lamar Sprinkles, CMA,  December 26, 2009 2:32 PM  Follow-up for Phone Call        No answer, no vm.....................Marland KitchenLamar Sprinkles, CMA  December 26, 2009 3:07 PM   Pt canceled the apt w/ENT and she went to see Dr Stevphen [place]. Pt was told she might have had allergic reaction to an inhaler. Pt has apt with Dr Suszanne Conners, tuesday for eval of her throat -referred by Dr Corinda Gubler.  Follow-up by: Lamar Sprinkles, CMA,  December 26, 2009 5:25 PM

## 2010-04-15 NOTE — Assessment & Plan Note (Signed)
Summary: ov for evaluation of weakness and fatigue   Vital Signs:  Patient profile:   77 year old female Height:      66 inches Weight:      171 pounds BMI:     27.70 O2 Sat:      99 % on Room air Temp:     97.4 degrees F oral Pulse rate:   51 / minute BP sitting:   120 / 70  (left arm) Cuff size:   regular  Vitals Entered By: Bill Salinas CMA (November 14, 2009 10:02 AM)  O2 Flow:  Room air CC: ov follow up on weakness and fatigue/ ab Comments pt is not taking aerochamber or symbicort/ ab   Primary Care Provider:  Pennie Banter MD -PMD, Dr Alroy Dust - Pulmonary  CC:  ov follow up on weakness and fatigue/ ab.  History of Present Illness: Patient seen 1 week ago for laryngitis. she did try  stopping symbicort but did not really see much of a difference. she continues to have mild laryngitis. She also has been feeling weak and fatigued. She does report a sensation of feeling "strangled" up high in her throat. This has been getting better.   Current Medications (verified): 1)  Cosopt 2-0.5 %  Soln (Dorzolamide-Timolol) .... One Drop Three Times A Day 2)  Pilopine Hs 4 %  Gel (Pilocarpine Hcl) .... Use in Right Eye At Bedtime 3)  Allegra 60 Mg  Tabs (Fexofenadine Hcl) .... Take 1-2 Tablet By Mouth Once A Day As Needed For Allergies.Marland KitchenMarland Kitchen 4)  Nasonex 50 Mcg/act  Susp (Mometasone Furoate) .... Use 2 Sprays in Each Nostril At Bedtime.Marland KitchenMarland Kitchen 5)  Proair Hfa 108 (90 Base) Mcg/act Aers (Albuterol Sulfate) .... Inhale 2 Sprays Every 6 H As Needed For Wheezing... 6)  Bystolic 10 Mg Tabs (Nebivolol Hcl) .... Take 1 Tablet By Mouth Once A Day 7)  Amlodipine Besylate 5 Mg Tabs (Amlodipine Besylate) .Marland Kitchen.. 1 By Mouth Once Daily 8)  Cozaar 100 Mg  Tabs (Losartan Potassium) .... Take 1 Tablet By Mouth Once A Day 9)  Furosemide 40 Mg  Tabs (Furosemide) .... Take 1 Tablet By Mouth Once A Day 10)  Zetia 10 Mg  Tabs (Ezetimibe) .... Take 1 Tablet By Mouth Once A Day 11)  Prilosec Otc 20 Mg Tbec  (Omeprazole Magnesium) .Marland Kitchen.. 1 Tab Once Daily 30 Minutes Before Breakfast 12)  Aerochamber Plus  Misc (Spacer/aero-Holding Chambers) .... Use As Directed 13)  Symbicort 160-4.5 Mcg/act Aero (Budesonide-Formoterol Fumarate) .Marland Kitchen.. 1 Puff Two Times A Day  Allergies (verified): 1)  ! Codeine PMH-FH-SH reviewed-no changes except otherwise noted  Review of Systems  The patient denies anorexia, fever, weight loss, chest pain, syncope, dyspnea on exertion, abdominal pain, severe indigestion/heartburn, muscle weakness, suspicious skin lesions, unusual weight change, and enlarged lymph nodes.    Physical Exam  General:  WNWD AA femlae in no avtue distress Head:  normocephalic and atraumatic.   Eyes:  C&S clear Mouth:  posterior pharynx clear Neck:  supple and full ROM.   Lungs:  normal respiratory effort and normal breath sounds.   Heart:  normal rate and regular rhythm.   Msk:  normal ROM and no joint tenderness.   Pulses:  2+ radial Neurologic:  alert & oriented X3, cranial nerves II-XII intact, strength normal in all extremities, and gait normal.     Impression & Recommendations:  Problem # 1:  ACUTE LARYNGITIS, WITHOUT MENTION OF OBSTRUCTIO (ICD-464.00) Patient with continued laryngitis although improved.  she also has a sense of "strangling" at times.  Plan - continued observation           if symptoms persist will need to refer to ENT for fiberoptic exam pharynx, vocal chords, etc.  Problem # 2:  ASTHMA UNSPECIFIED (ICD-493.90) No active asthma.  Plan - resume symbicort  Her updated medication list for this problem includes:    Proair Hfa 108 (90 Base) Mcg/act Aers (Albuterol sulfate) ..... Inhale 2 sprays every 6 h as needed for wheezing...    Symbicort 160-4.5 Mcg/act Aero (Budesonide-formoterol fumarate) .Marland Kitchen... 1 puff two times a day  Problem # 3:  HYPERLIPIDEMIA (ICD-272.4)  Her updated medication list for this problem includes:    Zetia 10 Mg Tabs (Ezetimibe) .Marland Kitchen... Take 1  tablet by mouth once a day  Orders: TLB-Lipid Panel (80061-LIPID) TLB-Hepatic/Liver Function Pnl (80076-HEPATIC) TLB-TSH (Thyroid Stimulating Hormone) (84443-TSH)  Addendum - LDL 120 - below NCEP goal for low risk patient  Problem # 4:  HYPERTENSION (ICD-401.9)  Her updated medication list for this problem includes:    Bystolic 10 Mg Tabs (Nebivolol hcl) .Marland Kitchen... Take 1 tablet by mouth once a day    Amlodipine Besylate 5 Mg Tabs (Amlodipine besylate) .Marland Kitchen... 1 by mouth once daily    Cozaar 100 Mg Tabs (Losartan potassium) .Marland Kitchen... Take 1 tablet by mouth once a day    Furosemide 40 Mg Tabs (Furosemide) .Marland Kitchen... Take 1 tablet by mouth once a day  Orders: TLB-BMP (Basic Metabolic Panel-BMET) (80048-METABOL)  BP today: 120/70 Prior BP: 150/72 (11/07/2009)  Prior 10 Yr Risk Heart Disease: 11 % (10/07/2009)  Adequate control on present medications. Lab results with normal renal function and elecrolytes.  Complete Medication List: 1)  Cosopt 2-0.5 % Soln (Dorzolamide-timolol) .... One drop three times a day 2)  Pilopine Hs 4 % Gel (Pilocarpine hcl) .... Use in right eye at bedtime 3)  Allegra 60 Mg Tabs (Fexofenadine hcl) .... Take 1-2 tablet by mouth once a day as needed for allergies.Marland KitchenMarland Kitchen 4)  Nasonex 50 Mcg/act Susp (Mometasone furoate) .... Use 2 sprays in each nostril at bedtime.Marland KitchenMarland Kitchen 5)  Proair Hfa 108 (90 Base) Mcg/act Aers (Albuterol sulfate) .... Inhale 2 sprays every 6 h as needed for wheezing... 6)  Bystolic 10 Mg Tabs (Nebivolol hcl) .... Take 1 tablet by mouth once a day 7)  Amlodipine Besylate 5 Mg Tabs (Amlodipine besylate) .Marland Kitchen.. 1 by mouth once daily 8)  Cozaar 100 Mg Tabs (Losartan potassium) .... Take 1 tablet by mouth once a day 9)  Furosemide 40 Mg Tabs (Furosemide) .... Take 1 tablet by mouth once a day 10)  Zetia 10 Mg Tabs (Ezetimibe) .... Take 1 tablet by mouth once a day 11)  Prilosec Otc 20 Mg Tbec (Omeprazole magnesium) .Marland Kitchen.. 1 tab once daily 30 minutes before breakfast 12)   Aerochamber Plus Misc (Spacer/aero-holding chambers) .... Use as directed 13)  Symbicort 160-4.5 Mcg/act Aero (Budesonide-formoterol fumarate) .Marland Kitchen.. 1 puff two times a day  Other Orders: TLB-CBC Platelet - w/Differential (85025-CBCD) TLB-B12 + Folate Pnl (82746_82607-B12/FOL) Flu Vaccine 63yrs + (11914) Administration Flu vaccine - MCR (N8295)       Flu Vaccine Consent Questions     Do you have a history of severe allergic reactions to this vaccine? no    Any prior history of allergic reactions to egg and/or gelatin? no    Do you have a sensitivity to the preservative Thimersol? no    Do you have a past history of Guillan-Barre Syndrome? no  Do you currently have an acute febrile illness? no    Have you ever had a severe reaction to latex? no    Vaccine information given and explained to patient? yes    Are you currently pregnant? no    Lot Number:[id number]   Exp Date:09/13/2010   Site Given  Left Deltoid IMu

## 2010-04-15 NOTE — Assessment & Plan Note (Signed)
Summary: follow up-pt here at 4:45/ok per MR/la   Visit Type:  Follow-up Primary Provider/Referring Provider:  Pennie Banter MD -PMD, Dr Alroy Dust - Pulmonary  CC:  follow up, pt states she has SOB with exertion pt states she can sleep at night due to the SOB, dry cough, wheezing, x2-3 weeks, , and Lipid Management.  History of Present Illness: OV 10/07/2009. KNown asthmatic. Generally well controlled on pulmicort 2 puff two times a day with reported good compliance. Last seen 08/06/2009 acutely for acute asthma exacerbtion. Did well after that with abx and pred taper. However, now for past 2 weeks reporting increaseing dry cough and dyspnea. Dyspnea is worse than cough. Dyspnea is progressive but cough is not. Associatd wheezing present. Rates symptoms as moderate in severity. Normallynot dyspneic doing household work but today working at [employer] she wsa dyspneic with stair climbing. Resting wheeze present. Symptoms c/w prior asthma attacks but she is unsure what triggered it. She has been upset about a friend's death from a year ago as May-July marks 1  year anniversary. Also weather is very hot and humid.   She has been compliant with pulmicort but I am not sure she understands what the pro-air is meant for. enies associated fever, nocturnal awakening, hemoptysis, chills, runny nose, hemoptysis, edema, chest pain, palpitations, dizziness, syncope. REcollects normal stress test a year ago  Lipid Management History:      Positive NCEP/ATP III risk factors include female age 105 years old or older and hypertension.  Negative NCEP/ATP III risk factors include HDL cholesterol greater than 60 and non-tobacco-user status.     Preventive Screening-Counseling & Management  Alcohol-Tobacco     Smoking Status: never  Caffeine-Diet-Exercise     Caffeine use/day: 0     Does Patient Exercise: no  Current Medications (verified): 1)  Cosopt 2-0.5 %  Soln (Dorzolamide-Timolol) .... One  Drop Three Times A Day 2)  Pilopine Hs 4 %  Gel (Pilocarpine Hcl) .... Use in Right Eye At Bedtime 3)  Allegra 60 Mg  Tabs (Fexofenadine Hcl) .... Take 1-2 Tablet By Mouth Once A Day As Needed For Allergies.Marland KitchenMarland Kitchen 4)  Nasonex 50 Mcg/act  Susp (Mometasone Furoate) .... Use 2 Sprays in Each Nostril At Bedtime.Marland KitchenMarland Kitchen 5)  Symbicort 160-4.5 Mcg/act  Aero (Budesonide-Formoterol Fumarate) .... Two Puffs Twice Daily 6)  Proair Hfa 108 (90 Base) Mcg/act Aers (Albuterol Sulfate) .... Inhale 2 Sprays Every 6 H As Needed For Wheezing... 7)  Bystolic 10 Mg Tabs (Nebivolol Hcl) .... Take 1 Tablet By Mouth Once A Day 8)  Amlodipine Besylate 5 Mg Tabs (Amlodipine Besylate) .Marland Kitchen.. 1 By Mouth Once Daily 9)  Cozaar 100 Mg  Tabs (Losartan Potassium) .... Take 1 Tablet By Mouth Once A Day 10)  Furosemide 40 Mg  Tabs (Furosemide) .... Take 1 Tablet By Mouth Once A Day 11)  Zetia 10 Mg  Tabs (Ezetimibe) .... Take 1 Tablet By Mouth Once A Day 12)  Prilosec Otc 20 Mg Tbec (Omeprazole Magnesium) .Marland Kitchen.. 1 Tab Once Daily 30 Minutes Before Breakfast 13)  Avelox 400 Mg  Tabs (Moxifloxacin Hcl) .... By Mouth Daily 14)  Prednisone 10 Mg  Tabs (Prednisone) .... Take  4 Tablets Daily X3 Days, Then 3 Tablets Daily X 3 Days, Then 2 Tablets Daily X3 Days, Then 1 Tablet Daily X3 Days, Then Stop 15)  Aerochamber Plus  Misc (Spacer/aero-Holding Chambers) .... Use As Directed  Allergies: 1)  ! 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  Risk Factors: Caffeine Use: 0 (10/07/2009) Exercise: no (10/07/2009)  Risk Factors: Smoking Status: never (10/07/2009)  Past Medical History: Reviewed history from  08/06/2009 and no changes required. GLAUCOMA (ICD-365.9) ALLERGY (ICD-995.3) ASTHMA (ICD-493.90) HYPERTENSION (ICD-401.9) HYPERLIPIDEMIA (ICD-272.4) GERD (ICD-530.81) DIVERTICULOSIS OF COLON (ICD-562.10) DIVERTICULAR BLEEDING, HX OF (ICD-V12.79) HYSTERECTOMY, HX OF (ICD-V45.77) DEGENERATIVE JOINT DISEASE (ICD-715.90) ANXIETY (ICD-300.00)  Past Surgical History: Reviewed history from 08/06/2009 and no changes required. Hysterectomy-fibroid tumor Bladder repair after perforation Kidney surgery Left Knee arthroscopy '98  Family History: Reviewed history from 12/12/2007 and no changes required. father - deceased @35 : cause of death unkown mother-deceased @ 88: bladder tumors Neg - breast or colon cancer; DM; CAD  Social History: Reviewed history from 12/12/2007 and no changes required. 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  Review of Systems       The patient complains of shortness of breath with activity and non-productive cough.  The patient denies shortness of breath at rest, productive cough, coughing up blood, chest pain, irregular heartbeats, acid heartburn, indigestion, loss of appetite, weight change, abdominal pain, difficulty swallowing, sore throat, tooth/dental problems, headaches, nasal congestion/difficulty breathing through nose, sneezing, itching, ear ache, anxiety, depression, hand/feet swelling, joint stiffness or pain, rash, change in color of mucus, and fever.    Vital Signs:  Patient profile:   77 year old female Height:      66 inches Weight:      172.2 pounds O2 Sat:      96 % on Room air Temp:     97.9 degrees F oral Pulse rate:   66 / minute BP sitting:   160 / 84  (left arm) Cuff size:   regular  Vitals Entered By: Carver Fila (October 07, 2009 4:21 PM)  O2 Flow:  Room air CC: follow up, pt states she has SOB with exertion pt states she  can sleep at night due to the SOB, dry cough, wheezing, x2-3 weeks, , Lipid Management Is Patient Diabetic? No Comments meds and allergies updated Phone number updated Carver Fila  October 07, 2009 4:21 PM    Physical Exam  General:  WNWD mildly overweight AA female in no distress Head:  Normocephalic and atraumatic without obvious abnormalities. No apparent alopecia or balding. Eyes:  normal fundiscopic exam with handheld instrument Ears:  R ear normal and L ear normal.   Nose:  no external deformity.   Mouth:  good dentition, no gingival abnormalities, no dental plaque, and pharynx pink and moist.   Neck:  supple, full ROM, no masses, no thyromegaly, and no thyroid nodules or tenderness.   Chest Wall:  no deformities noted Lungs:  wheezes bilateral.   NO DISTRESs FULL SENTENCES NO ACC MUSCLE USE Heart:  normal rate and regular rhythm.   Abdomen:  soft, non-tender, and normal bowel sounds.   Msk:  no joint swelling, no joint warmth, and no joint instability.   Pulses:  2+ pedal pulses L and R ext Extremities:  no peripheral edema Neurologic:  CN II-XII grossly intact with normal reflexes, coordination, muscle strength and  tone Skin:  turgor normal, color normal, no rashes, no suspicious lesions, no ecchymoses, no petechiae, no purpura, no ulcerations, and no edema.   Cervical Nodes:  no anterior cervical adenopathy and no posterior cervical adenopathy.   Psych:  alert and cooperative; normal mood and affect; normal attention span and concentration   Pulmonary Function Test Date: 10/07/2009 5:19 PM Gender: Female  Pre-Spirometry FVC    Value: 3.54 L/min   % Pred: 143.50 % FEV1    Value: 2.44 L     Pred: 1.91 L     % Pred: 127.50 % FEV1/FVC  Value: 68.84 %     % Pred: 89.70 %  Impression & Recommendations:  Problem # 1:  ASTHMA UNSPECIFIED (ICD-493.90) Assessment Unchanged Cliniclally she is AE-ASthma. does not appear to need admission. Cleda Daub shows FEv1 > 100% predicted;  dobut this is correct. She has significant wheezing but no increased work of breathing. HAve given her neb xopenex and IM 80mg  depot medrol in office and one table avelox.   I am not sure if the heat or recent sad emotions over death of friend has striggered her asthma  PLAN 4 more days of avelox (total 5 days) 12 day prednisone taper starting 10/08/2009 tomorrow change pulmicort to symbicort via spacer (I see no contraindication for LABA andd she has had 2 AEasthma since May 2011 FU 7-10 days with NP - to ensure med understanding and technique is correct and reassess spirometry  Medications Added to Medication List This Visit: 1)  Symbicort 160-4.5 Mcg/act Aero (Budesonide-formoterol fumarate) .... Two puffs twice daily 2)  Avelox 400 Mg Tabs (Moxifloxacin hcl) .... By mouth daily 3)  Prednisone 10 Mg Tabs (Prednisone) .... Take  4 tablets daily x3 days, then 3 tablets daily x 3 days, then 2 tablets daily x3 days, then 1 tablet daily x3 days, then stop 4)  Aerochamber Plus Misc (Spacer/aero-holding chambers) .... Use as directed  Other Orders: Nebulizer Tx (16109) Admin of Therapeutic Inj  intramuscular or subcutaneous (60454) Est. Patient Level I (09811) Prescription Created Electronically [phone]) Depo- Medrol 80mg  (J1040)  Lipid Assessment/Plan:      Based on NCEP/ATP III, the patient's risk factor category is "0-1 risk factors".  The patient's lipid goals are as follows: Total cholesterol goal is 200; LDL cholesterol goal is 160; HDL cholesterol goal is 40; Triglyceride goal is 150.    Patient Instructions: 1)  you are in an asthma attack 2)  take avelox one table a day for 5 days 3)   - first dose given today 4)   - start 2nd dose tomorrow 10/08/2009 tuesday 5)  take 12 day prednisone taper starting 10/08/2009 tuesday 6)  take pro-air 2 puff as needed only- very important as needed 7)  stop pulmicort 8)  start symbicort 160/4.5 dose at 2 puff two times a day 9)  use symbicort with  spacer - learn technique  10)  return in 7-10 days to see Tammy the nurse 11)  at followup we will rechck your medication technique and spiro 12)  if you get worse call us or go to er Prescriptions: AEROCHAMBER PLUS  MISC (SPACER/AERO-HOLDING CHAMBERS) use as directed  #1 x 0   Entered by:   Michel Bickers CMA   Authorized by:   Kalman Shan MD   Signed by:   Michel Bickers CMA on 10/07/2009   Method used:   Print then Give to Patient   RxID:   [id number] PREDNISONE 10 MG  TABS (  PREDNISONE) Take  4 tablets daily x3 days, then 3 tablets daily x 3 days, then 2 tablets daily x3 days, then 1 tablet daily x3 days, then stop  #48 x 0   Entered and Authorized by:   Kalman Shan MD   Signed by:   Michel Bickers CMA on 10/07/2009   Method used:   Historical   RxID:   [id number] AVELOX 400 MG  TABS (MOXIFLOXACIN HCL) By mouth daily  #5 x 0   Entered and Authorized by:   Kalman Shan MD   Signed by:   Michel Bickers CMA on 10/07/2009   Method used:   Historical   RxID:   [id number] SYMBICORT 160-4.5 MCG/ACT  AERO (BUDESONIDE-FORMOTEROL FUMARATE) Two puffs twice daily  #1 x 6   Entered and Authorized by:   Kalman Shan MD   Signed by:   Kalman Shan MD on 10/07/2009   Method used:   Electronically to        [facility] (retail)       [address]       [place], [place]  [id number]       Ph: [phone]       Fax: [phone]   RxID:   [id number]      CardioPerfect Spirometry  ID: [id number] Patient: [name], [name] DOB: 1934-02-08 Age: 77 Years Old Sex: Female Race: Black Physician: Jacques Navy MD Height: 66 Weight: 172.2 Smoker: No Status: Unconfirmed Past Medical History:  GLAUCOMA (ICD-365.9) ALLERGY (ICD-995.3) ASTHMA (ICD-493.90) HYPERTENSION (ICD-401.9) HYPERLIPIDEMIA (ICD-272.4) GERD (ICD-530.81) DIVERTICULOSIS OF COLON (ICD-562.10) DIVERTICULAR BLEEDING, HX OF (ICD-V12.79) HYSTERECTOMY, HX OF  (ICD-V45.77) DEGENERATIVE JOINT DISEASE (ICD-715.90) ANXIETY (ICD-300.00)  Recorded: 10/07/2009 5:19 PM  Parameter  Measured Predicted %Predicted FVC     3.54        2.47        143.50 FEV1     2.44        1.91        127.50 FEV1%   68.84        76.77        89.70 PEF    5.51        4.88        113   Interpretation:    Medication Administration  Injection # 1:    Medication: Depo- Medrol 80mg     Diagnosis: ACUTE BRONCHITIS (ICD-466.0)    Route: IM    Site: RUOQ gluteus    Exp Date: 07/13/2012    Lot #: [id number]    Mfr: Pharmacia    Patient tolerated injection without complications    Given by: Carver Fila, MA  Medication # 1:    Medication: Xopenex 0.63mg     Diagnosis: ACUTE BRONCHITIS (ICD-466.0)    Route: inhaled    Exp Date: 11/13/2009    Lot #: [id number]    Mfr: Sepracor    Patient tolerated medication without complications    Given by: Carver Fila, MA  Orders Added: 1)  Nebulizer Tx [57322] 2)  Admin of Therapeutic Inj  intramuscular or subcutaneous [96372] 3)  Est. Patient Level I [02542] 4)  Prescription Created Electronically [G8553] 5)  Depo- Medrol 80mg  [J1040]

## 2010-04-15 NOTE — Progress Notes (Signed)
Summary: REFERRAL  Phone Note Call from Patient Call back at Home Phone [phone]   Caller: Patient - call after 2pm Call For: Jacques Navy MD Summary of Call: Pt calling to let Dr Debby Bud know she still has laryngitis. She has had it since she saw him last (11/14/09). She states he wanted her to let him know if she cont'd to have laryngitis. Please advise. Initial call taken by: Verdell Face,  December 09, 2009 12:27 PM  Follow-up for Phone Call        noted. Will refer her to ENT for evaluation of persistent laryngitis.  [facility] notified to schedule with Narda Bonds Follow-up by: Jacques Navy MD,  December 09, 2009 4:31 PM  Additional Follow-up for Phone Call Additional follow up Details #1::        Pt informed  Additional Follow-up by: Lamar Sprinkles, CMA,  December 09, 2009 5:41 PM  New Problems: CHRONIC LARYNGITIS (ICD-476.0)   New Problems: CHRONIC LARYNGITIS (ICD-476.0)

## 2010-04-15 NOTE — Progress Notes (Signed)
Summary: cough  Phone Note Call from Patient Call back at [facility] Phone [phone] Call back at Work Phone [phone]   Caller: Patient Call For: Sullivan Blasing Summary of Call: Wants something called in for cough.//[facility] Initial call taken by: Darletta Moll,  May 24, 2009 11:51 AM  Follow-up for Phone Call        Pt c/o horseness and productive cough with clear phlegm x 3 days. pt denies fever. Pt has tried robutussin with no relief. She is requesting cough syrup that is inexspensive. Please advise. Carron Curie CMA  May 24, 2009 12:30 PM   Additional Follow-up for Phone Call Additional follow up Details #1::        per SN----ok for pt to have zpak #1  take as directed and will let pt know to use delsym otc as directed since pt is allergic to codeine..  this has been called into the pharmacy for the pt and lmomtccb for pt Randell Loop CMA  May 24, 2009 1:08 PM   pt aware. Carron Curie CMA  May 24, 2009 5:06 PM     New/Updated Medications: ZITHROMAX Z-PAK 250 MG TABS (AZITHROMYCIN) take as directed Prescriptions: ZITHROMAX Z-PAK 250 MG TABS (AZITHROMYCIN) take as directed  #1 pak x 0   Entered by:   Randell Loop CMA   Authorized by:   Michele Mcalpine MD   Signed by:   Randell Loop CMA on 05/24/2009   Method used:   Electronically to        [facility] (retail)       [address]       [place], [place]  [id number]       Ph: [phone]       Fax: [phone]   RxID:   [id number]

## 2010-04-25 ENCOUNTER — Telehealth (INDEPENDENT_AMBULATORY_CARE_PROVIDER_SITE_OTHER): Payer: Self-pay | Admitting: *Deleted

## 2010-04-30 ENCOUNTER — Encounter: Payer: Self-pay | Admitting: Pulmonary Disease

## 2010-04-30 ENCOUNTER — Ambulatory Visit (INDEPENDENT_AMBULATORY_CARE_PROVIDER_SITE_OTHER): Payer: Medicare Other | Admitting: Pulmonary Disease

## 2010-04-30 DIAGNOSIS — M1990 Unspecified osteoarthritis, unspecified site: Secondary | ICD-10-CM

## 2010-04-30 DIAGNOSIS — K219 Gastro-esophageal reflux disease without esophagitis: Secondary | ICD-10-CM

## 2010-04-30 DIAGNOSIS — T7840XA Allergy, unspecified, initial encounter: Secondary | ICD-10-CM

## 2010-04-30 DIAGNOSIS — E785 Hyperlipidemia, unspecified: Secondary | ICD-10-CM

## 2010-04-30 DIAGNOSIS — I10 Essential (primary) hypertension: Secondary | ICD-10-CM

## 2010-04-30 DIAGNOSIS — J45909 Unspecified asthma, uncomplicated: Secondary | ICD-10-CM

## 2010-04-30 DIAGNOSIS — F411 Generalized anxiety disorder: Secondary | ICD-10-CM

## 2010-05-01 NOTE — Letter (Signed)
Summary: [facility] Medical Office Visit   [facility] Medical Office Visit   Imported By: Roderic Ovens 04/18/2010 14:15:01  _____________________________________________________________________  External Attachment:    Type:   Image     Comment:   External Document

## 2010-05-01 NOTE — Progress Notes (Signed)
Summary: cough and sob  Phone Note Call from Patient Call back at [facility] Phone [phone]   Caller: Patient Call For: nadel Summary of Call: Spoke with pt.  She is c/o some chest tightness, mild wheeze, increased SOB and dry cough x 4 days.  Denies fever.  No appts available today.  Pls advise thanks! allergic to codiene [facility] Initial call taken by: Vernie Murders,  April 25, 2010 11:37 AM  Follow-up for Phone Call        PER SN, no openings in our office today, she can call Dr. Debby Bud if needing to be seen. Pt to continue Pulmicort 2 puffs twice daily and Proair as needed. Can call in Zpak and Pred Pak 5mg  (6 day pack).Michel Bickers [facility]  April 25, 2010 4:04 PM  Additional Follow-up for Phone Call Additional follow up Details #1::        Called, spoke with pt.  states she is using pulmicort 180 2 puffs two times a day and proair every 6 hours as needed.  Ok with rxs being sent to pharmacy vs OV with Norrins.  She also requesting to schedule f/u with SN.  OV scheduled for 04/30/10 at 9am -- pt aware.  Zpak and pred pak rxs sent to [facility] -- pt aware and verbalized understanding  Additional Follow-up by: Gweneth Dimitri RN,  April 25, 2010 4:19 PM    New/Updated Medications: ZITHROMAX Z-PAK 250 MG TABS (AZITHROMYCIN) take as directed PREDNISONE (PAK) 5 MG TABS (PREDNISONE) 6 day pak - take as directed Prescriptions: PREDNISONE (PAK) 5 MG TABS (PREDNISONE) 6 day pak - take as directed  #1 x 0   Entered by:   Gweneth Dimitri RN   Authorized by:   Michele Mcalpine MD   Signed by:   Gweneth Dimitri RN on 04/25/2010   Method used:   Electronically to        [facility] (retail)       [address]       [place], [place]  [id number]       Ph: [phone]       Fax: [phone]   RxID:   [id number] ZITHROMAX Z-PAK 250 MG TABS (AZITHROMYCIN) take as directed  #1 x 0   Entered by:   Gweneth Dimitri RN   Authorized by:   Michele Mcalpine MD   Signed by:   Gweneth Dimitri RN on 04/25/2010   Method used:   Electronically to        [facility] (retail)       [address]       [place], [place]  [id number]       Ph: [phone]       Fax: [phone]   RxID:   [id number]

## 2010-05-01 NOTE — Progress Notes (Signed)
Summary: medication issues  Phone Note Call from Patient Call back at Home Phone [phone]   Caller: Patient Call For: nadel Summary of Call: Pt states she has concerns about using z-pak wants to speak with nurse concerning this. Initial call taken by: Darletta Moll,  April 25, 2010 4:28 PM  Follow-up for Phone Call        Pt states Zpak has caused GI upset in the past for the first few days but then stopped. Told pt to take medications with food to help coat the stomach. She had verbal understanding. Follow-up by: Michel Bickers CMA,  April 25, 2010 4:50 PM

## 2010-05-07 NOTE — Assessment & Plan Note (Signed)
Summary: follow up / cj   Primary Care Provider:  Pennie Banter MD -PMD, Dr Alroy Dust - Pulmonary  CC:  9 month ROV & review of interval hx....  History of Present Illness: 77 y/o BF here for a follow up visit... she is DrJoe's housekeeper and a long time [facility] patient... she sees DrNorins for primary care and Dr Presley Raddle for GI... we have attended her for Pulm issues including allergic rhinitis and asthma- doing well on Pulmicort, Albuterol, Allegra, and Nasonex...   ~  Aug 06, 2009:  DrJoe called requesting urgent appt for [place] w/ 4d hx ST, cough, chest congestion, beige sputum, weakness & not resting well... she denies f/c/s, CP, hemoptysis- does note sl dyspnea... she had CXR 2/11 which was clear, NAD... exam reveals few rhonchi/ wheezes c/w AB & we discussed Rx w/ Deop/ Dosepak/ Ceftin/ Mucinex/ cough syrup...   ~  April 30, 2010:    Jenan is currently improved & approaching her baseline but had quite a detour over the last 6+months> started w/ "an asthma attack" 7/11 & seen by MR- rx w/ Avelox/ Pred & changed from Pulmicort/ Proair to Symbicort which she felt was too strong for her; developed laryngitis w/ hoarseness & dysphonia, sent by DrJoe to DrESL & then to DrTeoh for ENT; eventually referred to [facility] DrLintzenich w/ dx of LER & laryngeal edema- rx w/ Omep40mg Bid; pt states "my voice just came back 2 weeks ago".    Coincidentally she called w/ a 1 week hx cough, worse at night, congestion, sm amt beige sputum, denies f/c/s, notes mild dyspnea & chest discomfort;  ZPak & Pred taper was called in & some better but still c/o noct cough (Delsym causes diarrhea, Tussionex too $$, we discussed Hydromet Prn);  **we discussed finishing the ZPak, Pred, adding Mucinex, Fluids, & Hydromet Prn;  she is back on the Pulmicort & Proair.    Other medical problems followed & managed by DrNorins (see below)...   Current Problems:  GLAUCOMA (ICD-365.9) - on drops from DrShapiro... ALLERGY  (ICD-995.3) - on [id number]- 1/2 tab daily, NASONEX 2 sp at bedtime, & ASTEPRO Prn for DrGene... ASTHMA (ICD-493.90) - as above on PULMICORT 2 spBid, and PROAIR Prn... HYPERTENSION (ICD-401.9) - on BYSTOLIC 10mg /d, AMLODIPINE 5mg /d, COZAAR 100mg /d, & LASIX 40mg /d... HYPERLIPIDEMIA (ICD-272.4) - on ZETIA 10mg /d... GERD (ICD-530.81) - on OMEPRAZOLE 40mg  Bid per ENT... DIVERTICULOSIS OF COLON (ICD-562.10) DIVERTICULAR BLEEDING, HX OF (ICD-V12.79) - she is off ASA and prev on Fe... Family Hx of COLON CANCER (ICD-153.9) HYSTERECTOMY, HX OF (ICD-V45.77) DEGENERATIVE JOINT DISEASE (ICD-715.90) ANXIETY (ICD-300.00)   Preventive Screening-Counseling & Management  Alcohol-Tobacco     Smoking Status: never  Caffeine-Diet-Exercise     Caffeine use/day: 0     Does Patient Exercise: no  Allergies: 1)  ! Codeine 2)  ! * Symbicort  Comments:  Nurse/Medical Assistant: The patient's medications and allergies were reviewed with the patient and were updated in the Medication and Allergy Lists.  Past History:  Past Medical History: GLAUCOMA (ICD-365.9) ALLERGY (ICD-995.3) ASTHMA (ICD-493.90) HYPERTENSION (ICD-401.9) HYPERLIPIDEMIA (ICD-272.4) GERD (ICD-530.81) DIVERTICULOSIS OF COLON (ICD-562.10) DIVERTICULAR BLEEDING, HX OF (ICD-V12.79) HYSTERECTOMY, HX OF (ICD-V45.77) DEGENERATIVE JOINT DISEASE (ICD-715.90) ANXIETY (ICD-300.00)  Past Surgical History: Hysterectomy-fibroid tumor Bladder repair after perforation Kidney surgery Left Knee arthroscopy '98 cataract w/ IOL OS - '11 Nile Riggs)  Family History: Reviewed history from 12/12/2007 and no changes required. father - deceased @35 : cause of death unkown mother-deceased @ 9: bladder tumors Neg - breast or colon cancer;  DM; CAD  Social History: Reviewed history from 12/12/2007 and no changes required. 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  Review of Systems      See HPI       The patient complains of hoarseness, dyspnea on exertion, and prolonged cough.  The patient denies anorexia, fever, weight loss, weight gain, vision loss, decreased hearing, chest pain, syncope, peripheral edema, headaches, hemoptysis, abdominal pain, melena, hematochezia, severe indigestion/heartburn, hematuria, incontinence, muscle weakness, suspicious skin lesions, transient blindness, difficulty walking, depression, unusual weight change, abnormal bleeding, enlarged lymph nodes, and angioedema.    Vital Signs:  Patient profile:   77 year old female Height:      66 inches Weight:      178.13 pounds BMI:     28.85 O2 Sat:      98 % on Room air Temp:     97.2 degrees F oral Pulse rate:   68 / minute BP sitting:   154 / 82  (right arm) Cuff size:   regular  Vitals Entered By: Randell Loop CMA (April 30, 2010 8:50 AM)  O2 Sat at Rest %:  98 O2 Flow:  Room air CC: 9 month ROV & review of interval hx... Is Patient Diabetic? No Pain Assessment Patient in pain? no      Comments meds updated today with pt   Physical Exam  Additional Exam:  WD, WN, 76 y/o BF in NAD... GENERAL:  Alert & oriented; pleasant & cooperative... HEENT:  [place]/AT, EOM-wnl, PERRLA, EACs-clear, TMs-wnl, NOSE-clear, THROAT-clear & wnl, she is no longer hoarse. NECK:  Supple w/ fairROM; no JVD; normal carotid impulses w/o bruits; no thyromegaly or nodules palpated; no lymphadenopathy. CHEST:  Decr BS bilat w/ scat rhonchi & end-exp wheezing... no signs of consolidation... HEART:  Regular Rhythm; without murmurs/ rubs/ or gallops... ABDOMEN:  Soft & nontender; normal bowel sounds; no organomegaly or masses detected. EXT: without deformities, mild arthritic changes; no varicose veins/ +venous insuffic/ no edema. NEURO:  CN's intact; no focal neuro deficits... DERM:  No lesions noted; no rash etc...    Impression & Recommendations:  Problem #  1:  ASTHMA UNSPECIFIED (ICD-493.90) She appears to have another mild exac>  we discussed Rx w/ the ZPak, Pred taper, Mucinex + Fluids, Hydromet cough syrup Prn... she will continue the Pulmicort 2sp Bid & Proair Prn... she has a spacer. The following medications were removed from the medication list:    Symbicort 160-4.5 Mcg/act Aero (Budesonide-formoterol fumarate) .Marland Kitchen... 1 puff two times a day Her updated medication list for this problem includes:    Pulmicort Flexhaler 180 Mcg/act Aepb (Budesonide) .Marland Kitchen... 2 inhalations two times a day as directed...    Proair Hfa 108 (90 Base) Mcg/act Aers (Albuterol sulfate) ..... Inhale 2 sprays every 6 h as needed for wheezing...    Prednisone (pak) 5 Mg Tabs (Prednisone) .Marland KitchenMarland KitchenMarland KitchenMarland Kitchen 6 day pak - take as directed  Problem # 2:  CHRONIC LARYNGITIS (ICD-476.0) Improved w/ the LER Rx including OMEP 40mg  Bid & antireflux regimen...  Problem # 3:  ALLERGY (ICD-995.3) She will continue w/ her Allegra, Nasonex, & Astepro Prn... she is followed by DrESL.  Problem # 4:  GERD (ICD-530.81) She will continue the antireflux regimen + Omep 40mg Bid... Her updated medication list for this problem includes:    Prilosec 40 Mg Cpdr (Omeprazole) .Marland Kitchen... Take one tablet by mouth two times  a day 30 mins prior to breakfast and dinner  Problem # 5:  HYPERTENSION (ICD-401.9) BP regulated by DrNorins... Her updated medication list for this problem includes:    Bystolic 10 Mg Tabs (Nebivolol hcl) .Marland Kitchen... Take 1 tablet by mouth once a day    Amlodipine Besylate 5 Mg Tabs (Amlodipine besylate) .Marland Kitchen... 1 by mouth once daily    Cozaar 100 Mg Tabs (Losartan potassium) .Marland Kitchen... Take 1 tablet by mouth once a day    Furosemide 40 Mg Tabs (Furosemide) .Marland Kitchen... Take 1 tablet by mouth once a day  Problem # 6:  HYPERLIPIDEMIA (ICD-272.4) Hyperlipidemia followed by IM, DrNorins... Her updated medication list for this problem includes:    Zetia 10 Mg Tabs (Ezetimibe) .Marland Kitchen... Take 1 tablet by mouth once a  day  Problem # 7:  ANXIETY (ICD-300.00) She is rather anxious but declines anxiolytic rx...  Problem # 8:  OTHER MEDICAL PROBLEMS AS NOTED>>>  Complete Medication List: 1)  Cosopt 2-0.5 % Soln (Dorzolamide-timolol) .... One drop three times a day 2)  Pilopine Hs 4 % Gel (Pilocarpine hcl) .... Use in right eye at bedtime 3)  Allegra Allergy 180 Mg Tabs (Fexofenadine hcl) .... Take 1/2 to 1 tab dailt as needed for allergies.Marland KitchenMarland Kitchen 4)  Nasonex 50 Mcg/act Susp (Mometasone furoate) .... Use 2 sprays in each nostril at bedtime.Marland KitchenMarland Kitchen 5)  Pulmicort Flexhaler 180 Mcg/act Aepb (Budesonide) .... 2 inhalations two times a day as directed... 6)  Proair Hfa 108 (90 Base) Mcg/act Aers (Albuterol sulfate) .... Inhale 2 sprays every 6 h as needed for wheezing... 7)  Bystolic 10 Mg Tabs (Nebivolol hcl) .... Take 1 tablet by mouth once a day 8)  Amlodipine Besylate 5 Mg Tabs (Amlodipine besylate) .Marland Kitchen.. 1 by mouth once daily 9)  Cozaar 100 Mg Tabs (Losartan potassium) .... Take 1 tablet by mouth once a day 10)  Furosemide 40 Mg Tabs (Furosemide) .... Take 1 tablet by mouth once a day 11)  Zetia 10 Mg Tabs (Ezetimibe) .... Take 1 tablet by mouth once a day 12)  Prilosec 40 Mg Cpdr (Omeprazole) .... Take one tablet by mouth two times a day 30 mins prior to breakfast and dinner 13)  Zithromax Z-pak 250 Mg Tabs (Azithromycin) .... Take as directed 14)  Prednisone (pak) 5 Mg Tabs (Prednisone) .... 6 day pak - take as directed 15)  Hydromet 5-1.5 Mg/75ml Syrp (Hydrocodone-homatropine) .Marland Kitchen.. 1 tsp by mouth every 6h as needed for cough  Patient Instructions: 1)  Today we updated your med list- see below.... 2)  We refilled the Zetia per your request, and we wrote a new perscription for a less expensive cough syrup to use at night... 3)  Don't forget touse the OTC MUCINEX 1-2 tabs twice daily w/ plenty of water as needed for that congestion.Marland KitchenMarland Kitchen 4)  Call for any problems.Marland KitchenMarland Kitchen 5)  Let's plan a recheck in 6 months, sooner if  necessary... Prescriptions: HYDROMET 5-1.5 MG/5ML SYRP (HYDROCODONE-HOMATROPINE) 1 tsp by mouth every 6H as needed for cough  #6 oz x 6   Entered and Authorized by:   Michele Mcalpine MD   Signed by:   Michele Mcalpine MD on 04/30/2010   Method used:   Print then Give to Patient   RxID:   [id number] ZETIA 10 MG  TABS (EZETIMIBE) Take 1 tablet by mouth once a day  #30 x 12   Entered and Authorized by:   Michele Mcalpine MD   Signed by:   Michele Mcalpine  MD on 04/30/2010   Method used:   Print then Give to Patient   RxID:   [id number]

## 2010-05-19 ENCOUNTER — Telehealth (INDEPENDENT_AMBULATORY_CARE_PROVIDER_SITE_OTHER): Payer: Self-pay | Admitting: *Deleted

## 2010-05-27 NOTE — Progress Notes (Signed)
Summary: regarding her allergies  Phone Note Call from Patient Call back at [facility] Phone [phone]   Caller: Patient Call For: nadel Summary of Call: Patient phoned and would like Dr Kirstie Mirza nurse to call her back regarding her allergies. She stated that they are acting ugly causing coughing and breathing issues.She can be reached at [phone] Initial call taken by: Vedia Coffer,  May 19, 2010 2:27 PM  Follow-up for Phone Call        Called and spoke with pt and she c/o dry cough, some wheezing, chest congestion, little runny nose, increased sob x 5 days. Pt is still taking her cough syrup that was given to her at last visit and it helps some. Pt has tried mucinex but mucinex makes her feel strange and blacks out. Pt would like SN recs. pt also states maybe another antibiotic would help her. [facility] Please advise allergies: 1)  ! Codeine 2)  ! * Symbicort 3) Mucinex.  Carver Fila  May 19, 2010 5:16 PM   Additional Follow-up for Phone Call Additional follow up Details #1::        per SN increase fluids, try zyrtec otc 10 mg, f/u with Dr. Nira Retort for allergy review. Called and spoke with pt and advised her of SN recs and she verbalized understanding and had no other questions and pt states she will contact Dr. Henrene Hawking office in the am for follow up with him Carver Fila  May 19, 2010 5:54 PM    New Allergies: ! * MUCINEX New Allergies: ! [facility]

## 2010-06-29 ENCOUNTER — Other Ambulatory Visit: Payer: Self-pay | Admitting: Internal Medicine

## 2010-07-10 ENCOUNTER — Other Ambulatory Visit: Payer: Self-pay | Admitting: Internal Medicine

## 2010-07-18 ENCOUNTER — Ambulatory Visit (INDEPENDENT_AMBULATORY_CARE_PROVIDER_SITE_OTHER): Payer: Medicare Other | Admitting: Adult Health

## 2010-07-18 ENCOUNTER — Encounter: Payer: Self-pay | Admitting: Adult Health

## 2010-07-18 VITALS — BP 144/78 | HR 64 | Temp 97.6°F | Ht 66.0 in | Wt 177.1 lb

## 2010-07-18 DIAGNOSIS — J45909 Unspecified asthma, uncomplicated: Secondary | ICD-10-CM

## 2010-07-18 MED ORDER — CEFDINIR 300 MG PO CAPS: 300.0000 mg | ORAL_CAPSULE | Freq: Two times a day (BID) | ORAL | Status: AC

## 2010-07-18 MED ORDER — PREDNISONE 10 MG PO TABS: ORAL_TABLET | ORAL | Status: AC

## 2010-07-18 MED ORDER — LEVALBUTEROL HCL 0.63 MG/3ML IN NEBU
0.6300 mg | INHALATION_SOLUTION | Freq: Once | RESPIRATORY_TRACT | Status: AC
  Administered 2010-07-18: 0.63 mg via RESPIRATORY_TRACT

## 2010-07-18 NOTE — Progress Notes (Signed)
Addended by: Boone Master on: 07/18/2010 05:25 PM   Modules accepted: Orders

## 2010-07-18 NOTE — Patient Instructions (Addendum)
Omnicef 300mg  Twice daily  For 7 days Mucinex DM Twice daily  As needed  Cough/congestion  Prednisone taper over next week.  Fluids and rest  Saline nasal rinses As needed   follow up Dr. Kriste Basque  In 3 months

## 2010-07-18 NOTE — Assessment & Plan Note (Signed)
Flare with URI/AR Plan xopenex neb Omnicef 300mg  Twice daily  For 7 days Mucinex DM Twice daily  As needed  Cough/congestion  Prednisone taper over next week.  Fluids and rest  Saline nasal rinses As needed   follow up Dr. Kriste Basque  In 3 months

## 2010-07-18 NOTE — Progress Notes (Signed)
Subjective:    Patient ID: [name], female    DOB: [birth year], 77 y.o.   MRN: [id number]  HPI 77 yo with known hx of asthma, HTN, GERD and DJD.   ~ April 30, 2010:  [name] is currently improved & approaching her baseline but had quite a detour over the last 6+months> started w/ "an asthma attack" 7/11 & seen by MR- rx w/ Avelox/ Pred & changed from Pulmicort/ Proair to Symbicort which she felt was too strong for her; developed laryngitis w/ hoarseness & dysphonia, sent by DrJoe to DrESL & then to DrTeoh for ENT; eventually referred to [facility] DrLintzenich w/ dx of LER & laryngeal edema- rx w/ Omep40mg Bid; pt states "my voice just came back 2 weeks ago".  Coincidentally she called w/ a 1 week hx cough, worse at night, congestion, sm amt beige sputum, denies f/c/s, notes mild dyspnea & chest discomfort; ZPak & Pred taper was called in & some better but still c/o noct cough (Delsym causes diarrhea, Tussionex too $$, we discussed Hydromet Prn); **we discussed finishing the ZPak, Pred, adding Mucinex, Fluids, & Hydromet Prn; she is back on the Pulmicort & Proair.  Other medical problems followed & managed by DrNorins (see below)...  07/18/10 Acute office visit.  Complains of dry cough, chest congestion, tightness in chest, increased SOB x6days . Has some drainage. No fever. Using allegra without much help. Seems her breathing is not as good as pollen has been bad outside. Seems last week she started her symptoms with stuffiness, malaise. Cough is getting worse. Last night she coughed most of night. Using hydromet for cough which helps.   Using pulmicort everyday no missed doses .   Review of Systems Constitutional:   No  weight loss, night sweats,  Fevers, chills, fatigue, or  lassitude.  HEENT:   No headaches,  Difficulty swallowing,  Tooth/dental problems, or  Sore throat,                +sneezing, itching, ear ache, nasal congestion, post nasal drip,   CV:  No chest pain,  Orthopnea, PND,  swelling in lower extremities, anasarca, dizziness, palpitations, syncope.   GI  No heartburn, indigestion, abdominal pain, nausea, vomiting, diarrhea, change in bowel habits, loss of appetite, bloody stools.   Resp: No shortness of breath with exertion . Neg at rest . + excess mucus, + productive cough,  + non-productive cough,  No coughing up of blood.  No change in color of mucus.  No wheezing.  No chest wall deformity  Skin: no rash or lesions.  GU: no dysuria, change in color of urine, no urgency or frequency.  No flank pain, no hematuria   MS:  No joint pain or swelling.  No decreased range of motion.    Psych:  No change in mood or affect. No depression or anxiety.  No memory loss.         Objective:   Physical Exam GEN: A/Ox3; pleasant , NAD, well nourished , elderly. Overnight   HEENT:  [place]/AT,  EACs-clear, TMs-wnl, NOSE-clear drainage THROAT-clear, no lesions, no postnasal drip or exudate noted  Upper denture set   NECK:  Supple w/ fair ROM; no JVD; normal carotid impulses w/o bruits; no thyromegaly or nodules palpated; no lymphadenopathy.  RESP  Coarse BS w/ no wheezing  CARD:  RRR, no m/r/g  , no peripheral edema, pulses intact, no cyanosis or clubbing.  GI:   Soft & nt; nml bowel sounds; no organomegaly or  masses detected.  Musco: Warm bil, no deformities or joint swelling noted.   Neuro: alert, no focal deficits noted.    Skin: Warm, no lesions or rashes         Assessment & Plan:

## 2010-07-22 ENCOUNTER — Telehealth: Payer: Self-pay | Admitting: Adult Health

## 2010-07-22 NOTE — Telephone Encounter (Signed)
lmomtcb x1 

## 2010-07-22 NOTE — Telephone Encounter (Signed)
Called, spoke with pt.  States she did start on the cefdiner but stopped it yesterday.  States her stool turned red yesterday.  Pt thought there was blood in it but she was not quite sure if this was really blood or not. States BM was normal; not hard or difficult.  States she read this could be a side effect from the med.  Pt concerned bc she has diverticulosis and didn't want this to cause a problem.  Pt states she is feeling "a lot better."  Would like to know if ok to stop the cefdiner now but finish the prednisone.  Tammy, pls advise.  Thanks!

## 2010-07-22 NOTE — Telephone Encounter (Signed)
That is fine . follow up if futher red stools.  Finish prednisone  Please contact office for sooner follow up if symptoms do not improve or worsen or seek emergency care   follow up as planned . And As needed

## 2010-07-22 NOTE — Telephone Encounter (Signed)
Pt is aware of recs per TP. She will call if symptoms do not improve or seek emergency care.

## 2010-07-29 NOTE — H&P (Signed)
NAMEJAMILAH, [name]              ACCOUNT NO.:  [id number]   MEDICAL RECORD NO.:  [id number]          PATIENT TYPE:  INP   LOCATION:  5121                         FACILITY:  [facility]   PHYSICIAN:  Gordy Savers, MDDATE OF BIRTH:  1934-02-20   DATE OF ADMISSION:  11/04/2007  DATE OF DISCHARGE:                              HISTORY & PHYSICAL   CHIEF COMPLAINT:  Bright red bleeding per rectum.   HISTORY OF PRESENT ILLNESS:  The patient is a 77 year old black female  who was stable until approximately 7 days ago.  At that time, she noted  a single episode of small volume bright red rectal bleeding.  This  occurred briefly 1 day prior to admission.  On the day of admission, the  patient has had 4 episodes of bright red rectal bleeding, which has been  painless.  She describes a quantity as from half a cup to three-quarters  of a cup per bowel movement.  She states that approximately 1 year ago,  she had colonoscopy performed and does have a history of diverticular  disease.  She denies any abdominal pain or other constitutional  symptoms.  She denies any weakness, diaphoresis, dizziness, or any other  presyncopal symptoms.  The patient was evaluated in the emergency  department where she was noted to have a hemoglobin of 12.0 grams  percent and hematocrit of 36.4.  Clinical exam revealed bright red blood  per rectum.  The patient is now admitted for further evaluation and  treatment of her lower GI bleed.   PAST MEDICAL HISTORY:  The patient has a history of hypertension and  dyslipidemia.  Additionally, she was treated for glaucoma.   ADDITIONAL DIAGNOSES:  1. Seasonal allergic rhinitis.  2. Gastroesophageal reflux disease.  3. Osteoarthritis.  4. History of anxiety.   SURGICAL PROCEDURES:  Remote hysterectomy.  She states that she has had  surgery for tubal pregnancy and also had unclear kidney surgery for  malformation.   PRESENT MEDICAL REGIMEN:  1. Cozaar 50 mg daily.  2. Zetia 10 mg daily.  3. Allegra p.r.n.  4. Lasix 40 mg daily.  5. Pulmicort 2 puffs  b.i.d.  6. Trusopt 2% ophthalmic drops 1 drop to both eyes t.i.d.  7. Cosopt 2-0.5% 1 drop to both eyes t.i.d.  8. Pilopine HS 4% gel to be applied to the right eye at bedtime.   SOCIAL HISTORY:  She is widowed, lives with her youngest son.  Nondrinker, nonsmoker.  She has 2 daughters, 2 sons, 9 grandchildren,  and 31 great-grandchildren.   FAMILY HISTORY:  Father died young, unclear causes.  Mother died at age  79.  One brother died of multiple myeloma.  Three sisters, 1 died of  ovarian cancer, another has a history of cerebrovascular disease.   REVIEW OF SYSTEMS:  Unremarkable except for mentioned in the history of  present illness.   PHYSICAL EXAMINATION:  GENERAL:  An elderly female in no acute distress  at rest.  VITAL SIGNS:  Blood pressure 130/80 and pulse rate 72.  SKIN:  Warm and dry without diaphoresis.  HEENT:  Head  and neck exam revealed normal pupil responses.  Conjunctivae clear.  Conjunctivae did not appear particularly pale.  ENT  normal.  NECK:  No bruits.  CHEST:  Clear.  CARDIOVASCULAR:  Regular rhythm.  No murmurs.  ABDOMEN:  Revealed surgical scars in lower midline.  There is no  organomegaly or masses.  No tenderness appreciated.  Bowel sounds were  normal.  EXTREMITIES:  Revealed no edema.  Dorsalis pedis pulses were full.  Posterior tibial pulses were faint.  NEURO:  Nonfocal.   IMPRESSION:  Lower gastrointestinal bleeding probably secondary to  diverticular disease.   ADDITIONAL DIAGNOSES:  1. Hypertension.  2. Dyslipidemia.  3. Glaucoma.   DISPOSITION:  The patient will be admitted to the hospital.  Serial  hemoglobin/hematocrit will be monitored.  The patient will have blood  typed and crossed.  Her antihypertensive medications will be held at  this time.  If bleeding continues, will be considered for a GI and/or  General Surgical consult.      Gordy Savers, MD  Electronically Signed     PFK/MEDQ  D:  11/04/2007  T:  11/05/2007  Job:  [id number]

## 2010-07-29 NOTE — Discharge Summary (Signed)
[name], [name]              ACCOUNT NO.:  [id number]   MEDICAL RECORD NO.:  [id number]          PATIENT TYPE:  INP   LOCATION:  4713                         FACILITY:  [facility]   PHYSICIAN:  Natalie T. Russella Dar, MD, FACGDATE OF BIRTH:  03-Apr-1933   DATE OF ADMISSION:  11/09/2007  DATE OF DISCHARGE:  11/11/2007                               DISCHARGE SUMMARY   LIST OF ADMITTING DIAGNOSES:  1. Recurrent rectal bleeding, hematochezia.  Probably diverticular in      origin.  Admitted November 04, 2007 through November 06, 2007, for      similar painless hematochezia attributed to diverticulosis.  2. Acute blood loss anemia.  Did not require transfusion with blood      during recent admission.  3. Hypertension.  4. Hyperlipidemia.  5. Glaucoma.  6. Allergic rhinitis.  7. Status post left knee arthroscopy.  8. Status post total abdominal hysterectomy in 1969.  Urinary bladder      nicked during procedure requiring intraoperative repair.  Status      post tacking up of one of the kidneys.  Apparently, due to      childhood trauma, one of the kidneys were somewhat mobile and      required surgery to correct this problem performed when she was      middle aged.  9. Diverticulosis with colonoscopies in November 2007 and 2002.  10.Mild hyperglycemia.   BRIEF HISTORY:  [name] is a pleasant 77 year old African American  female.  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  She was  admitted November 04, 2007 through November 06, 2007, with painless rectal  bleeding, presumed secondary to diverticulosis.  Bleeding was a fairly  large amounts and resolved within 36 hours.  Hemoglobin was 12.6 when  she was admitted and 10.2 at discharge.  She did not undergo any  colonoscopic evaluation because she was up-to-date on colonoscopy and  the presentation was so typical for diverticulosis.  She was not  unstable during that admission.   The patient went home, subsequently had brown bowel  movements, but on  the morning of November 09, 2007, had recurrent episodes of moderate-to-  large amounts of hematochezia.  Only minor pelvic discomfort that would  resolve after bowel movements.  No nausea or vomiting.  The patient was  directed by the [place] office to direct admission to a telemetry bed at  [facility].  In retrospect, the patient said that she was not  using any aspirin or Advil, but on retrospect during hospitalization, it  turned out that she actually had one dose of Advil when she got home.   On arrival to the floor, her blood pressure was actually somewhat  elevated at 184/97.  She had taken her Cozaar, but not her Lasix that  morning.   PROCEDURE:  None.   CONSULTATIONS:  None.   LABORATORY DATA:  Initial hemoglobin was 10.3 and hematocrit of 31.4.  On the day of discharge, hemoglobin 9.8, hematocrit 29.7.  White blood  cell count 7.5, platelets 267,000.  Sodium 142, potassium 4.0, chloride  109,  CO2 28.  Glucose 111.  BUN 7, creatinine 0.79.   HOSPITAL COURSE:  The patient was started on IV fluids.  Serial blood  counts were obtained throughout the hospitalization.  Initially, the  diet was clear, but later advanced to heart healthy.   The patient had no further bright red blood per rectum.  Later on November 10, 2007, hospital day #2, she did have 2 dark stools.  She was feeling  well.  There was no dizziness.  The hypertension on presentation was  resolved with administration of Lasix and thereafter she was  normotensive.  The blood counts showed a slight decline of hemoglobin  from 10.3 to 9.8.  She did not receive any blood cell products.  On the  morning of November 11, 2007, she was discharged to home in stable and  improved condition.   Several discussions were held with the patient regarding why she is  having the bleeding and the answer is we presume it is diverticulosis,  but there is no particular reason why her diverticular bleeding  whereas  previously they had been quiescent.  The nature of diverticular bleeds  was explained to the patient.  She was also informed that she now has an  increased likelihood of having recurrent bleeding, and if she bleeds  aggressively enough, a nuclear scan and angiogram might be able to  pinpoint the source for the location of the bleeding.  However, because  her bleeding calms down when she is in the hospital, we have not pursued  these options.  She was also advised that the ultimate cure for  diverticular bleeding is a surgical colectomy, however, that is a  drastic procedure to undertake given that she has only had a couple of  episodes of self-limited bleeding.   The patient was advised, if she has weakness, dizziness, or recurrent  bleeding that she should call [name] office and will probably need  to return to the hospital.   For followup, the patient has an appointment towards the middle end of  September with [name] and she can check in with him.  She does not  need to see [name] unless she has recurrent bleeding.   MEDICATIONS AT DISCHARGE:  A new medication will be over-the-counter  aspirin 65 mg once daily.  All old medicines to be continued and these  are;  1. Cozaar 100 mg daily.  2. Lasix 40 mg daily.  3. Timolol eye drops 1 drop into both eyes 3 times daily.  4. Zetia 10 mg daily.  5. Allegra 60 mg daily.  6. Tylenol as needed for discomfort, fever, etc.   DIET:  Heart healthy.   ACTIVITY:  She can return to work on November 21, 2007, if she wants to,  but she can stay out the week of November 21, 2007, and return the  following week, if she wishes.  She is not to lift more than 10 pounds.  She knows not to take any ASA or NSAID products for 2 weeks and use  Tylenol as needed for discomfort or fever.   CONDITION ON DISCHARGE:  Stable.      Jennye Moccasin, PA-C      Venita Lick. Russella Dar, MD, [facility]  Electronically Signed    SG/MEDQ  D:   11/11/2007  T:  11/12/2007  Job:  [id number]   cc:   Rosalyn Gess. Norins, MD  Hedwig Morton. Juanda Chance, MD

## 2010-07-29 NOTE — Discharge Summary (Signed)
[name], [name]              ACCOUNT NO.:  [id number]   MEDICAL RECORD NO.:  [id number]          PATIENT TYPE:  INP   LOCATION:  4713                         FACILITY:  [facility]   PHYSICIAN:  Malcolm T. Russella Dar, MD, FACGDATE OF BIRTH:  1933-12-26   DATE OF ADMISSION:  11/09/2007  DATE OF DISCHARGE:  11/11/2007                               DISCHARGE SUMMARY   ADMITTING DIAGNOSES:  1. Recurrent hematochezia most likely diverticular in origin.      Admitted November 04, 2007 to November 06, 2007 for hematochezia      presumed secondary to diverticulosis.  2. Acute blood loss anemia.  The patient did not require transfusion      during recent 2-day admission.  3. Hypertension.  4. Hyperlipidemia.  5. Glaucoma.  6. Allergic rhinitis.  7. Status post left knee arthroscopy.  8. Status post total abdominal hysterectomy in 1969.  Urinary bladder      nicks during procedure which required intraoperative repair.  9. Status post tacking up of one of the kidneys.  Apparently, due to      childhood trauma, one of the kidneys was somewhat mobile and this      was operatively anchored down during surgery performed in her      middle age.  10.Diverticulosis seen on colonoscopy, November 2007 and in 2002.   DISCHARGE SUMMARY:  1. Recurrent hematochezia, resolved.  Presumed secondary to      diverticulosis.  2. Anemia secondary to acute blood loss.  The patient did not receive      or require any blood cell products.  3. Hypertension, blood pressures    Dictation ended at this point      Jennye Moccasin, PA-C      Malcolm T. Russella Dar, MD, [facility]  Electronically Signed    SG/MEDQ  D:  11/11/2007  T:  11/12/2007  Job:  [id number]   cc:   Rosalyn Gess. Norins, MD  Hedwig Morton. Juanda Chance, MD

## 2010-07-29 NOTE — H&P (Signed)
Herrera, Natalie              ACCOUNT NO.:  [id number]   MEDICAL RECORD NO.:  [id number]          PATIENT TYPE:  INP   LOCATION:  4713                         FACILITY:  [facility]   PHYSICIAN:  Malcolm T. Russella Dar, MD, FACGDATE OF BIRTH:  April 26, 1933   DATE OF ADMISSION:  11/09/2007  DATE OF DISCHARGE:                              HISTORY & PHYSICAL   CHIEF COMPLAINT:  Recurrent rectal bleeding.   HISTORY OF PRESENT ILLNESS:  [name] is a pleasant 77 year old  African American female.  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  She was recently admitted to November 04, 2007 through November 06, 2007, with a presumed diverticular bleed.  She had lower GI bleeding  of moderately large amounts that resolved within 36 hours.  Her  hemoglobin on admission to the hospital was 12.6  and it was 10.2 at  discharge.  She did not require a transfusion.  She did not undergo  colonoscopic or endoscopy.  Her last colonoscopy had been performed  November 2007 by Dr. Victorino Dike and this showed diverticulosis.  A  prior colonoscopy of 2002 also showed diverticulosis.  These were  described by Dr. Victorino Dike as moderately severe.   Subsequent to the discharge, the patient had normal bowel movements.  On  the morning of November 09, 2007, she had recurrence of moderate-to-large  amounts of hematochezia, a total of two episodes.  There was some mild  pelvic discomfort that would resolve after the bowel movements.  She has  had no nausea, vomiting.  Appetite is generally good.  She called the GI  office and was sent for direct admission to a telemetry bed at [facility].  The patient has not been using aspirin, she was not  using any prior to the recent admission and she is not taking any  Coumadin.  She took Aleve on one occasion since discharge. Notably on  blood pressure evaluation here, she has an elevated blood pressure.  She  took all of her blood pressure medicines except  Lasix this morning.   ALLERGIES:  CODEINE, it causes GI upset.   MEDICATIONS:  1. Cozaar 100 mg daily.  2. Lasix 40 mg daily.  3. Zetia 10 mg daily.  4. Allegra 60 mg daily.  5. Timolol eyedrops 1 drop to both eyes 3 times daily.   PAST MEDICAL HISTORY:  1. Hyperlipidemia.  2. Hypertension.  3. Glaucoma.  4. Allergic rhinitis.  5. Status post left knee arthroscopy.  6. Status post total abdominal hysterectomy in 1969.  During that      surgery, her bladder was nicked and this required intraoperative      repair.  7. She has also undergone which she describes as a packing up of one      of her kidneys.  Apparently, due to a childhood trauma.  One of the      kidneys was somewhat mobile and it was anchored down with this      surgery done as a middle-aged adult.   SOCIAL HISTORY:  The patient lives in  [place] with her youngest son.  She is widowed.  She has 4 kids, 9 grandkids, and 11 great grandkids.  Does not smoke or drink alcohol.   FAMILY HISTORY:  Brother had multiple myeloma.  Sister had ovarian  cancer and another sister had cerebrovascular disease.   LABORATORY:  Hemoglobin 10.3, hematocrit 31.4, MCV 83.8.  Platelets  267,000.  White blood cell count 7.5.  Basic metabolic profile is normal  with BUN 7, creatinine 0.7.  The only exception is glucose slightly  elevated at 111.   PHYSICAL EXAMINATION:  GENERAL:  The patient is alert and a very  pleasant elderly woman.  VITAL SIGNS:  Temperature 97.8, pulse 80, respirations 20, blood  pressure 184/97.  HEENT:  Extraocular movements are intact.  There is no conjunctival  pallor.  Oropharynx mucosa is moist and clear.  Dentition in good  repair.  CARDIOVASCULAR:  Regular rate and rhythm.  No murmurs, rubs, or gallops.  ABDOMEN:  Soft, nontender, nondistended with active bowel sounds.  No  masses.  No bruits.  No hepatosplenomegaly.  RECTAL:  There is deep red blood of a small quantity on digital exam.  No palpable  masses.  NEURO:  The patient is alert and oriented x3.  She is moving all 4  limbs, able to walk from her bed to the bathroom without difficulty.  There is no tremor.  No weakness.  EXTREMITIES:  No cyanosis, clubbing, or edema.  PSYCHIATRIC:  The patient is pleasant and cooperative.  DERMATOLOGIC:  The patient does have some striae of the skin on the  buttocks and abdomen.  There is no rash.  GU:  Deferred.  BREASTS:  Deferred.   IMPRESSION:  1. Recurrent gastrointestinal bleed, suspect this is diverticular in      origin.  2. Acute blood-loss anemia.  The patient did not require transfusion      during her recent 2-day admission.  3. Hypertension.  The patient took Cozaar this morning, but not her      Lasix.   PLAN:  1. Monitor vitals, obtain serial hemoglobin and hematocrits.  Give the      Lasix.  If she has persistent hypertension, we will call Dr.      Rene Paci in for consultation.  2. For now give IV fluids at 75 mL an hour.  3. No plans to check coagulation studies because these were normal      just a few days ago.  4. To hold a clot specimen in case we need to type and cross her for      transfusion.  No plans for colonoscopy,      nuclear medicine studies at present, but if the bleeding persists      and is vigorous enough, nuclear medicine should be performed.      Right now, she is not having enough bleeding for this test to      likely be of any help in making a diagnosis as to location of the      bleeding.      Jennye Moccasin, PA-C      Venita Lick. Russella Dar, MD, [facility]  Electronically Signed    SG/MEDQ  D:  11/10/2007  T:  11/11/2007  Job:  [id number]   cc:   Rosalyn Gess. Norins, MD

## 2010-07-29 NOTE — Discharge Summary (Signed)
NAME:  [name], [name]              ACCOUNT NO.:  [id number]   MEDICAL RECORD NO.:  [id number]          PATIENT TYPE:  INP   LOCATION:  5121                         FACILITY:  [facility]   PHYSICIAN:  Valetta Mole. Swords, MD    DATE OF BIRTH:  [birth year]   DATE OF ADMISSION:  11/04/2007  DATE OF DISCHARGE:  11/06/2007                               DISCHARGE SUMMARY   DISCHARGE DIAGNOSES:  1. Lower gastrointestinal bleed.  2. Anemia, acute blood loss.  3. Presumed diverticular bleed.  4. Hypertension.  5. Hyperlipidemia.  6. Glaucoma.  7. Allergic rhinitis.   DISCHARGE MEDICATIONS:  1. Cozaar 100 mg p.o. daily.  2. Lasix 40 mg p.o. daily.  3. Timolol eye drops 1 drop each eye t.i.d.  4. Zetia 10 mg p.o. daily.  5. Cozaar 100 mg p.o. daily.  6. Allegra 60 mg p.o. daily.   HOSPITAL PROCEDURES:  None.   HOSPITAL CONSULTS:  Gastroenterology.   HOSPITAL LABORATORIES:  Hemoglobin at the time of discharge was 10.2 and  was stable.  Hemoglobin at the time of admission was 12.0.  Fecal occult  blood was positive.  CMET was unremarkable except for glucose of 101.  Blood type is O+.   HOSPITAL COURSE:  The patient was admitted to the [facility] on  November 04, 2007.  See Dr. Vernon Prey note for details.  The patient  presented with history compatible with lower GI bleed.  The patient was  hospitalized and seen by Gastroenterology.  Colonoscopy is apparently up  to date.  No further recommendation made, as the patient's bleeding  stopped spontaneously.  Hemoglobin remained stable.   FOLLOWUP PLANS:  Dr. Debby Bud as already scheduled for this Friday.   CONDITION ON DISCHARGE:  Improved.      Bruce Rexene Edison Swords, MD  Electronically Signed     BHS/MEDQ  D:  11/06/2007  T:  11/06/2007  Job:  [id number]

## 2010-07-29 NOTE — Assessment & Plan Note (Signed)
[facility]                                 ON-CALL NOTE   NAME:Calk, [name]                     MRN:          [id number]  DATE:01/04/2008                            DOB:          Nov 08, 1933    [name] called to say that she is having very mild upper abdominal  pain.  She underwent a colonoscopy 2 days ago and was fine until about 4  o'clock today when she noticed some discomfort.  She was given  ranitidine but has not taken any.  I instructed her to take the  ranitidine 1 tablet twice a day and to use antacids as needed.  She is  also instructed to call the office in 1-2 days if her pain does not  subside or definitely if it worsens.     Barbette Hair. Arlyce Dice, MD,FACG  Electronically Signed    RDK/MedQ  DD: 01/04/2008  DT: 01/05/2008  Job #: [id number]   cc:   Hedwig Morton. Juanda Chance, MD

## 2010-08-11 ENCOUNTER — Other Ambulatory Visit: Payer: Self-pay | Admitting: Pulmonary Disease

## 2010-08-13 ENCOUNTER — Telehealth: Payer: Self-pay | Admitting: Pulmonary Disease

## 2010-08-13 MED ORDER — ALBUTEROL SULFATE HFA 108 (90 BASE) MCG/ACT IN AERS: 2.0000 | INHALATION_SPRAY | Freq: Four times a day (QID) | RESPIRATORY_TRACT | Status: DC | PRN

## 2010-08-13 NOTE — Telephone Encounter (Signed)
Called and spoke with pt. Pt aware rx sent to pharmacy.   

## 2010-08-18 ENCOUNTER — Telehealth: Payer: Self-pay | Admitting: Pulmonary Disease

## 2010-08-18 MED ORDER — PREDNISONE (PAK) 5 MG PO TABS: ORAL_TABLET | ORAL | Status: DC

## 2010-08-18 NOTE — Telephone Encounter (Signed)
Called, spoke with pt.  She c/o increased SOB all the time, a little wheezing, a little chest tightness, and a lot of coughing.  Cough is nonprod.  Pt states sxs started last week but worsened over the weekend.  Denies fever.  States she believes she needs a "shot of something."  OV offered with TP but pt states she would rather see if SN can see her.  Allergies verified.  [facility] [employer].  Pls advise.  Thanks!

## 2010-08-18 NOTE — Telephone Encounter (Signed)
Per SN---we have no openings---she can come in to see TP to maybe get a shot or we can call in pred dosepak 5mg    6 day pack.  Called and spoke with pt and she is aware of SN recs and pt wanted to have the pred taper called to the pharmacy.  Pt is aware that this has been done.

## 2010-08-18 NOTE — Telephone Encounter (Signed)
LMOMTCB

## 2010-08-22 ENCOUNTER — Telehealth: Payer: Self-pay | Admitting: *Deleted

## 2010-08-22 MED ORDER — HYDROCOD POLST-CHLORPHEN POLST 10-8 MG/5ML PO LQCR: 5.0000 mL | Freq: Two times a day (BID) | ORAL | Status: DC | PRN

## 2010-08-22 NOTE — Telephone Encounter (Signed)
Spoke with pt and she states the prednisone given to her on 08-18-10 has helped her SOB, but she is still having a dry cough that is much worse at night. She states the hydromet has not helped with this. She states she has taken tussionex in the past and it worked better but it was expensive, but she will be willing to pay for it if SN would prescribe it. Please advise. Carron Curie, CMA Allergies  Allergen Reactions  . Codeine     REACTION: NAUSEA  . Guaifenesin     REACTION: blacks out, feels wierd  . Symbicort     REACTION: causes her to lose her voice

## 2010-08-22 NOTE — Telephone Encounter (Signed)
Per SN-okay to give Tussionex #4oz 1 tsp po every 12 hours prn cough with 5 refills. Pt is aware and Rx sent.

## 2010-09-06 ENCOUNTER — Other Ambulatory Visit: Payer: Self-pay | Admitting: Internal Medicine

## 2010-09-08 NOTE — Telephone Encounter (Signed)
Rx Done . 

## 2010-11-02 ENCOUNTER — Other Ambulatory Visit: Payer: Self-pay | Admitting: Internal Medicine

## 2010-11-04 ENCOUNTER — Other Ambulatory Visit: Payer: Self-pay | Admitting: Internal Medicine

## 2010-11-11 ENCOUNTER — Encounter: Payer: Self-pay | Admitting: Internal Medicine

## 2010-11-12 ENCOUNTER — Ambulatory Visit (INDEPENDENT_AMBULATORY_CARE_PROVIDER_SITE_OTHER): Payer: Medicare Other | Admitting: Internal Medicine

## 2010-11-12 ENCOUNTER — Other Ambulatory Visit (INDEPENDENT_AMBULATORY_CARE_PROVIDER_SITE_OTHER): Payer: Medicare Other

## 2010-11-12 DIAGNOSIS — I10 Essential (primary) hypertension: Secondary | ICD-10-CM

## 2010-11-12 DIAGNOSIS — K219 Gastro-esophageal reflux disease without esophagitis: Secondary | ICD-10-CM

## 2010-11-12 DIAGNOSIS — J370 Chronic laryngitis: Secondary | ICD-10-CM

## 2010-11-12 DIAGNOSIS — M1990 Unspecified osteoarthritis, unspecified site: Secondary | ICD-10-CM

## 2010-11-12 DIAGNOSIS — J45909 Unspecified asthma, uncomplicated: Secondary | ICD-10-CM

## 2010-11-12 DIAGNOSIS — Z0000 Encounter for general adult medical examination without abnormal findings: Secondary | ICD-10-CM

## 2010-11-12 DIAGNOSIS — E785 Hyperlipidemia, unspecified: Secondary | ICD-10-CM

## 2010-11-12 LAB — COMPREHENSIVE METABOLIC PANEL
Albumin: 3.7 g/dL (ref 3.5–5.2)
BUN: 9 mg/dL (ref 6–23)
CO2: 29 mEq/L (ref 19–32)
GFR: 91.23 mL/min (ref >60.00)
Glucose, Bld: 102 mg/dL — ABNORMAL HIGH (ref 70–99)
Sodium: 143 mEq/L (ref 135–145)
Total Bilirubin: 0.3 mg/dL (ref 0.3–1.2)
Total Protein: 6.9 g/dL (ref 6.0–8.3)

## 2010-11-12 LAB — CBC WITH DIFFERENTIAL/PLATELET
Basophils Absolute: 0 10*3/uL (ref 0.0–0.1)
Eosinophils Absolute: 0.6 10*3/uL (ref 0.0–0.7)
Hemoglobin: 12.2 g/dL (ref 12.0–15.0)
Lymphocytes Relative: 27.9 % (ref 12.0–46.0)
MCHC: 32.3 g/dL (ref 30.0–36.0)
Monocytes Relative: 7.7 % (ref 3.0–12.0)
Neutro Abs: 3.5 10*3/uL (ref 1.4–7.7)
Neutrophils Relative %: 54.4 % (ref 43.0–77.0)
Platelets: 289 10*3/uL (ref 150.0–400.0)
RDW: 13.9 % (ref 11.5–14.6)

## 2010-11-12 LAB — LIPID PANEL
Cholesterol: 163 mg/dL (ref 0–200)
HDL: 59.6 mg/dL (ref >39.00)

## 2010-11-12 LAB — HEPATIC FUNCTION PANEL
Albumin: 3.7 g/dL (ref 3.5–5.2)
Alkaline Phosphatase: 71 U/L (ref 39–117)
Total Bilirubin: 0.3 mg/dL (ref 0.3–1.2)

## 2010-11-12 LAB — TSH: TSH: 1.21 u[IU]/mL (ref 0.35–5.50)

## 2010-11-12 NOTE — Progress Notes (Signed)
Subjective:    Patient ID: [name], female    DOB: [birth year], 77 y.o.   MRN: [id number]  HPI Mr.s Sarvis presents for routine follow-up. She was last seen about a year ago. She has been doing well except for allergy symptoms. She has been seen at [facility] by Dr. 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 She has done much better on high dose omeprazole.   She has had no falls and has no fall risk. She has smoke alarms and wears seat belt at all time. She has no firearms in the house. She has a positive attitude with no signs of depression. She follows a good diet and she does remain active although she does not have a regular exercise program. She has seen her dentist and her eye doctor. She has a good memory with 3/3 recall, ability to do calculations and well on the clock-face exercise.   Past Medical History  Diagnosis Date  . Glaucoma   . Allergy, unspecified not elsewhere classified   . Asthma   . HTN (hypertension)   . Hyperlipidemia   . GERD (gastroesophageal reflux disease)   . Diverticulosis of colon (without mention of hemorrhage)   . History of GI diverticular bleed   . DJD (degenerative joint disease)   . Anxiety    Past Surgical History  Procedure Date  . Total abdominal hysterectomy     fibroid tumor  . Bladder repair     after perforation  . Kidney surgery   . Knee arthroscopy 1998    left  . Cataract surgery 2011    w/ IOL Nile Riggs)   Family History  Problem Relation Age of Onset  . Other Mother     bladder tumor  . Colon cancer Neg Hx   . Breast cancer Neg Hx   . Diabetes Neg Hx   . Coronary artery disease Neg Hx    History   Social History  . Marital Status: Widowed    Spouse Name: N/A    Number of Children: N/A  . Years of Education: N/A   Occupational History  . 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 Smoking status: Never Smoker   . Smokeless tobacco: Not on file  . Alcohol Use: No    . Drug Use: No  . Sexually Active: Not on file   Other Topics Concern  . Not on file   Social History Narrative   2 sons > '53, '622 daughters > '59, '619 granddaughters11 great-grandchildren       Review of Systems Review of Systems  Constitutional:  Negative for fever, chills, activity change and unexpected weight change.  HEENT:  Negative for hearing loss, ear pain, congestion, neck stiffness and postnasal drip. Negative for sore throat or swallowing problems. Negative for dental complaints.   Eyes: Negative for vision loss or change in visual acuity.  Respiratory: Negative for chest tightness and wheezing.   Cardiovascular: Negative for chest pain and palpitation. No decreased exercise tolerance Gastrointestinal: No change in bowel habit. No bloating or gas. No reflux or indigestion Genitourinary: Negative for urgency, frequency, flank pain and difficulty urinating.  Musculoskeletal: Negative for myalgias, back pain, arthralgias and gait problem.  Neurological: Negative for dizziness, tremors, weakness and headaches.  Hematological: Negative for adenopathy.  Psychiatric/Behavioral: Negative for behavioral problems and dysphoric mood.       Objective:   Physical Exam Vitals reviewed-stable Gen'l: well nourished, well developed  overweight AA woman in no distress HEENT - [place]/AT, EACs/TMs normal, oropharynx with no buccal or palatal lesions, posterior pharynx clear, mucous membranes moist. C&S clear, PERRLA, fundi - normal Neck - supple, no thyromegaly Nodes- negative submental, cervical, supraclavicular regions Chest - no deformity, no CVAT Lungs - cleat without rales, wheezes. No increased work of breathing Breast - pendulous, nipples w/o discharge, skin normal, no fixed mass or suspicious lesions. Cardiovascular - regular rate and rhythm, quiet precordium, no murmurs, rubs or gallops, 2+ radial, DP and PT pulses Abdomen - BS+ x 4, no HSM, no guarding or rebound or  tenderness Pelvic - deferred  Rectal - deferred  Extremities - no clubbing, cyanosis, edema or deformity.  Neuro - A&O x 3, CN II-XII normal, motor strength normal and equal, DTRs 2+ and symmetrical biceps, radial, and patellar tendons. Cerebellar - no tremor, no rigidity, fluid movement and normal gait. Derm - Head, neck, back, abdomen and extremities without suspicious lesions  Lab Results  Component Value Date   WBC 6.5 11/12/2010   HGB 12.2 11/12/2010   HCT 37.8 11/12/2010   PLT 289.0 11/12/2010   CHOL 163 11/12/2010   TRIG 61.0 11/12/2010   HDL 59.60 11/12/2010   ALT 12 11/12/2010   ALT 12 11/12/2010   AST 16 11/12/2010   AST 16 11/12/2010   NA 143 11/12/2010   K 4.5 11/12/2010   CL 107 11/12/2010   CREATININE 0.8 11/12/2010   BUN 9 11/12/2010   CO2 29 11/12/2010   TSH 1.21 11/12/2010   Lab Results  Component Value Date   LDLCALC 91 11/12/2010       Glucose                  102         Assessment & Plan:

## 2010-11-13 DIAGNOSIS — Z0000 Encounter for general adult medical examination without abnormal findings: Secondary | ICD-10-CM | POA: Insufficient documentation

## 2010-11-13 NOTE — Assessment & Plan Note (Signed)
Excellent control on present medications.   Plan - no change in regimen

## 2010-11-13 NOTE — Assessment & Plan Note (Signed)
Stable with no significant limitations in activity.

## 2010-11-13 NOTE — Assessment & Plan Note (Signed)
Interval history is unremarkable with resolving laryngitis. Her physical exam is normal. Lab results are in normal limits. Last mammogram Jan '12. Last colonoscopy Oct '09 repeat 2019. Immunizations: current with pneumonia vaccine, due for Tdap and shingles.   In summary a very nice woman who appears to be medically stable. She will return in 6 months for interval follow-up.

## 2010-11-13 NOTE — Assessment & Plan Note (Signed)
BP Readings from Last 3 Encounters:  11/12/10 128/82  07/18/10 144/78  04/30/10 154/82   Good control  Of BP

## 2010-11-13 NOTE — Assessment & Plan Note (Signed)
Related to reflux and getting better. She will follow-up with her doctor at [facility].

## 2010-11-13 NOTE — Assessment & Plan Note (Signed)
Doing well with normal respirations at today's visit.

## 2010-11-17 ENCOUNTER — Encounter: Payer: Self-pay | Admitting: Internal Medicine

## 2010-11-24 ENCOUNTER — Encounter: Payer: Self-pay | Admitting: *Deleted

## 2010-11-24 ENCOUNTER — Telehealth: Payer: Self-pay | Admitting: Internal Medicine

## 2010-11-24 NOTE — Telephone Encounter (Signed)
Line busy. Will try again later.

## 2010-11-24 NOTE — Telephone Encounter (Signed)
Spoke with Dr. Glennon Hamilton and scheduled patient with Mike Gip, PA on 11/28/10 at 9:30 AM.

## 2010-11-26 ENCOUNTER — Ambulatory Visit (INDEPENDENT_AMBULATORY_CARE_PROVIDER_SITE_OTHER): Payer: Medicare Other | Admitting: *Deleted

## 2010-11-26 ENCOUNTER — Encounter: Payer: Self-pay | Admitting: *Deleted

## 2010-11-26 ENCOUNTER — Encounter: Payer: Self-pay | Admitting: Physician Assistant

## 2010-11-26 ENCOUNTER — Ambulatory Visit (INDEPENDENT_AMBULATORY_CARE_PROVIDER_SITE_OTHER): Payer: Medicare Other | Admitting: Physician Assistant

## 2010-11-26 VITALS — BP 144/82 | HR 68 | Ht 62.0 in | Wt 173.0 lb

## 2010-11-26 DIAGNOSIS — R1013 Epigastric pain: Secondary | ICD-10-CM

## 2010-11-26 DIAGNOSIS — Z23 Encounter for immunization: Secondary | ICD-10-CM

## 2010-11-26 NOTE — Progress Notes (Signed)
Reviewed and agree with management. Sobia Karger T. Keeshawn Fakhouri MD FACG 

## 2010-11-26 NOTE — Progress Notes (Signed)
Subjective:    Patient ID: [name], female    DOB: 04-09-1933, 77 y.o.   MRN: [id number]  HPI Mirabelle is a 77 year old American female known to Dr. Lina Sar with history of diverticular disease. She has had history of diverticular bleeding. Last colonoscopy was done in 2009 with finding of moderately severe diverticular disease the left colon and otherwise normal exam.  She comes in today with complaints of new onset of upper abdominal pain over the past 5 days. She says his pain is not in severe has been intermittent and had awoken her from sleep on 2 different occasions. Her appetite had been decreased though she was able to eat and did not experience any increase in her symptoms postprandially. She's had no associated nausea or vomiting no fever or chills no change in her bowel habits no melena or hematochezia. Patient says that she last had pain on 11/24/2010 which was mild and has not noted any pain since that time. She is a difficult time a describing the pain but says it was mostly an aching in the epigastrium without radiation. She's not been on any new medications does not take any aspirin or NSAIDs but has been on high dose Prilosec at 80 mg per day for history of laryngeal esophageal reflux disease. She been followed at [facility] for this and says that she's been doing much better over the past few months and actually had about an 8 month period of time where she had a severe hoarseness.    Review of Systems  Constitutional: Positive for appetite change.  HENT: Negative.   Eyes: Negative.   Respiratory: Negative.   Cardiovascular: Negative.   Gastrointestinal: Positive for abdominal pain.  Genitourinary: Negative.   Musculoskeletal: Negative.   Skin: Negative.   Neurological: Negative.   Psychiatric/Behavioral: Negative.    Outpatient Prescriptions Prior to Visit  Medication Sig Dispense Refill  . amLODipine (NORVASC) 5 MG tablet take 1 tablet by mouth once daily   30 tablet  5  . azelastine (ASTELIN) 137 MCG/SPRAY nasal spray Place 1 spray into the nose 2 (two) times daily. Use in each nostril as directed       . budesonide (PULMICORT FLEXHALER) 180 MCG/ACT inhaler Inhale 1 puff into the lungs 2 (two) times daily.        Marland Kitchen BYSTOLIC 10 MG tablet TAKE 1 TABLET BY MOUTH ONCE DAILY  30 tablet  4  . chlorpheniramine-HYDROcodone (TUSSIONEX PENNKINETIC ER) 10-8 MG/5ML LQCR Take 5 mLs by mouth every 12 (twelve) hours as needed.  120 mL  5  . dorzolamide-timolol (COSOPT) 22.3-6.8 MG/ML ophthalmic solution Place 1 drop into both eyes 3 (three) times daily.        Marland Kitchen ezetimibe (ZETIA) 10 MG tablet Take 10 mg by mouth daily.        . fexofenadine (ALLEGRA) 180 MG tablet Take 180 mg by mouth daily.        . furosemide (LASIX) 40 MG tablet TAKE 1 TABLET BY MOUTH ONCE DAILY  30 tablet  5  . levalbuterol (XOPENEX HFA) 45 MCG/ACT inhaler Inhale 1-2 puffs into the lungs at bedtime.        Marland Kitchen omeprazole (PRILOSEC) 40 MG capsule Take 40 mg by mouth 2 (two) times daily.        . pilocarpine (PILOPINE HS) 4 % ophthalmic gel Place 1 application into the right eye at bedtime.        Marland Kitchen albuterol (PROAIR HFA) 108 (90 BASE) MCG/ACT  inhaler Inhale 2 puffs into the lungs every 6 (six) hours as needed.  1 Inhaler  6  . losartan (COZAAR) 100 MG tablet TAKE 1 TABLET BY MOUTH ONCE DAILY  30 tablet  5  . mometasone (NASONEX) 50 MCG/ACT nasal spray 2 sprays by Nasal route daily.        . predniSONE, Pak, (STERAPRED) 5 MG TABS Take as directed--  1 each  0   Allergies  Allergen Reactions  . Codeine     REACTION: NAUSEA  . Guaifenesin     REACTION: blacks out, feels wierd  . Symbicort     REACTION: causes her to lose her voice       Objective:   Physical Exam Well-developed elderly African American female in no acute distress pleasant, alert and oriented x3, HEENT; nontraumatic normocephalic EOMI PERRLA sclera anicteric,Neck; Supple no JVD, Cardiovascular; regular rate and rhythm with  S1-S2 no murmur gallop, Pulmonary; clear bilaterally, Abdomen; soft basically nontender no palpable mass or hepatosplenomegaly, bowel sounds active, Rectal; not done, Extremities; no clubbing cyanosis or edema skin warm and dry benign, Psych; mood and affect normal and appropriate.        Assessment & Plan:  #30 77 year old female with 5-6 day history of mild epigastric pain now resolved. Etiology of her pain is not clear, question viral syndrome .  Plan; as she feels better at this time will not pursue any further workup. Reviewed labs from 11/12/2010 and these were all unremarkable including CBC and hepatic panel. I advised her sugar pain recur over the next few weeks that she should call and at that point will pursue further imaging with ultrasound versus CT  #2 Diverticulosis Up-to-date with colon screening last colonoscopy 2009  #3 Laryngeal esophageal reflux disease Currently stable on high dose Prilosec and being followed by ENT at [facility].

## 2010-11-26 NOTE — Patient Instructions (Signed)
Call Dr. Regino Schultze nurse , Rene Kocher, if your pain returns.  You may need x-rays.

## 2010-12-20 ENCOUNTER — Other Ambulatory Visit: Payer: Self-pay | Admitting: Internal Medicine

## 2011-01-21 ENCOUNTER — Telehealth: Payer: Self-pay | Admitting: Internal Medicine

## 2011-01-21 NOTE — Telephone Encounter (Signed)
Patient calling to report she is having epigastric pain again like she did when she came to see Mike Gip, PA on 11/26/10. She states the pain is in the upper part of her stomach. It wakes her up at 3-4 AM about 2 times/week. She states it is hard to describe the pain that is between her breasts other than it gets her attention. She gets up and takes Mylanta or Maalox and sometimes this helps. She states Amy Scappoose, [place] told her to call back if she continued to have the pain and tests could be ordered. Please, advise.

## 2011-01-21 NOTE — Telephone Encounter (Signed)
Please schedule for EGD to r/o ulcer or gastritis. Start or continue PPI.

## 2011-01-22 NOTE — Telephone Encounter (Signed)
Spoke with patient and gave her Dr. Brodie's recommendation.  

## 2011-01-22 NOTE — Telephone Encounter (Signed)
I think she ought to follow up with Dr L. At [facility] for her abd. Pain since he is her doctor. It is not good to have 2 doctors to be treating the same thing especially if one does not know about the other.[name]

## 2011-01-22 NOTE — Telephone Encounter (Signed)
Spoke with patient and she is on Omeprazole 40 mg BID and will continue this. She states she just had an EGD with Dr. Virginia Crews at [facility] on 1018/12(report in media) and she wants to be sure she needs to have this again. Please, advise.

## 2011-01-23 ENCOUNTER — Telehealth: Payer: Self-pay | Admitting: *Deleted

## 2011-01-23 NOTE — Telephone Encounter (Signed)
Spoke with patient and gave her the appointment date and time.

## 2011-01-23 NOTE — Telephone Encounter (Signed)
Scheduled patient for OV on 01/27/11 at 11:00 AM as per Dr. Juanda Chance. Left a message with family for patient to call me.

## 2011-01-27 ENCOUNTER — Ambulatory Visit (INDEPENDENT_AMBULATORY_CARE_PROVIDER_SITE_OTHER): Payer: Medicare Other | Admitting: Internal Medicine

## 2011-01-27 ENCOUNTER — Encounter: Payer: Self-pay | Admitting: Internal Medicine

## 2011-01-27 DIAGNOSIS — R1013 Epigastric pain: Secondary | ICD-10-CM

## 2011-01-27 DIAGNOSIS — K219 Gastro-esophageal reflux disease without esophagitis: Secondary | ICD-10-CM

## 2011-01-27 NOTE — Patient Instructions (Addendum)
You have been scheduled for an endoscopy. Please follow written instructions given to you at your visit today. CC: Dr Stevphen Rochester,

## 2011-01-27 NOTE — Progress Notes (Signed)
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 She was recently evaluated for hoarseness attributed to allergies. She also saw Dr. Stevphen Rochester. She was diagnosed with LPR and was put on Prilosec 40 mg twice a day by an ENT specialist at [facility]. The subxiphoid pain has persisted. It does not radiate to the sides or to the back. She has a history of a diverticular bleed requiring hospitalization in 2009 but she has not had any problems in that area. She has continued taking Metamucil daily. Patient saw Mike Gip, PA-C in September for the same abdominal pain but at that time did not have any complaints so the evaluation was delayed . An upper endoscopy in 1997 showed a distal esophageal stricture which was dilated.   Past Medical History  Diagnosis Date  . Glaucoma   . Allergy, unspecified not elsewhere classified   . Asthma   . HTN (hypertension)   . Hyperlipidemia   . GERD (gastroesophageal reflux disease)   . Diverticulosis of colon (without mention of hemorrhage)   . History of GI diverticular bleed   . DJD (degenerative joint disease)   . Anxiety   . Diverticulitis   . LPRD (laryngopharyngeal reflux disease)    Past Surgical History  Procedure Date  . Total abdominal hysterectomy     fibroid tumor  . Bladder repair     after perforation  . Kidney surgery   . Knee arthroscopy 1998    left  . Cataract surgery 2011    w/ IOL Nile Riggs)    reports that she has never smoked. She has never used smokeless tobacco. She reports that she does not drink alcohol or use illicit drugs. family history includes Arthritis in her mother; Heart disease in her father; and Other in her mother.  There is no history of Colon cancer, and Breast cancer,  and Diabetes, . Allergies  Allergen Reactions  . Codeine     REACTION: NAUSEA  . Guaifenesin     REACTION: blacks out, feels wierd  . Symbicort     REACTION: causes her to lose her voice        Review of Systems: Denies chest pain shortness of breath or change in bowel habits normal voice no cough  The remainder of the 10 point ROS is negative except as outlined in H&P   Physical Exam: General appearance  Well developed, in no distress. Eyes- non icteric. HEENT nontraumatic, normocephalic. Mouth no lesions, tongue papillated, no cheilosis., Normal voice, no cough Neck supple without adenopathy, thyroid not enlarged, no carotid bruits, no JVD. Lungs Clear to auscultation bilaterally. Cor normal S1, normal S2, regular rhythm, no murmur,  quiet precordium. Abdomen: Soft relaxed abdomen with well-healed surgical scar. Normoactive bowel sounds. No distention. Mild tenderness in midepigastrium and subxiphoid area. Right upper quadrant unremarkable . Rectal: Not done. Extremities no pedal edema. Skin no lesions. Neurological alert and oriented x 3. Psychological normal mood and affect.  Assessment and Plan:  Problem #1 epigastric pain occuring early in the morning suggestive of gastroesophageal reflux or possibly hiatal hernia. She has a history of esophageal stricture and has recently been evaluated for LPR. We need to rule out Barrett's esophagus. We will proceed with an upper endoscopy and possible esophageal dilation. Depending  on the findings, she may need to continue omeprazole or switch to another acid reducer. We may also consider an abdominal ultrasound depending on the EGD results.  Problem #2 diverticulosis. Patient is currently asymptomatic taking Metamucil daily. She is status post diverticular bleed in 2009. She is up-to-date on her colonoscopy at this time.  01/27/2011 Lina Sar

## 2011-01-29 ENCOUNTER — Encounter: Payer: Self-pay | Admitting: Internal Medicine

## 2011-01-29 ENCOUNTER — Ambulatory Visit (AMBULATORY_SURGERY_CENTER): Payer: Medicare Other | Admitting: Internal Medicine

## 2011-01-29 ENCOUNTER — Telehealth: Payer: Self-pay | Admitting: *Deleted

## 2011-01-29 DIAGNOSIS — R1013 Epigastric pain: Secondary | ICD-10-CM

## 2011-01-29 DIAGNOSIS — K2940 Chronic atrophic gastritis without bleeding: Secondary | ICD-10-CM

## 2011-01-29 DIAGNOSIS — K219 Gastro-esophageal reflux disease without esophagitis: Secondary | ICD-10-CM

## 2011-01-29 DIAGNOSIS — R079 Chest pain, unspecified: Secondary | ICD-10-CM

## 2011-01-29 DIAGNOSIS — R1319 Other dysphagia: Secondary | ICD-10-CM

## 2011-01-29 MED ORDER — SODIUM CHLORIDE 0.9 % IV SOLN: 500.0000 mL | INTRAVENOUS | Status: DC

## 2011-01-29 MED ORDER — ESOMEPRAZOLE MAGNESIUM 20 MG PO CPDR: 40.0000 mg | DELAYED_RELEASE_CAPSULE | Freq: Every day | ORAL | Status: DC

## 2011-01-29 NOTE — Progress Notes (Unsigned)
Subjective:    Patient ID: [name], female    DOB: [birth year], 77 y.o.   MRN: [id number]  HPI    Review of Systems     Objective:   Physical Exam        Assessment & Plan:

## 2011-01-29 NOTE — Telephone Encounter (Signed)
Per procedure report on 01/29/11, scheduled patient to see Dr. 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 Patient's husband given appointment date and time.

## 2011-01-29 NOTE — Progress Notes (Signed)
Patient did not experience any of the following events: a burn prior to discharge; a fall within the facility; wrong site/side/patient/procedure/implant event; or a hospital transfer or hospital admission upon discharge from the facility. (G8907) Patient did not have preoperative order for IV antibiotic SSI prophylaxis. (G8918)  

## 2011-01-29 NOTE — Patient Instructions (Signed)
FOLLOW THE DILATATION DIET.  CONTINUE YOUR MEDICATIONS, PPI EITHER NEXIUM OR PRILOSEC AS PER YOUR INSURANCE COVERAGE.  AWAIT BIOPSY RESULTS.

## 2011-01-30 ENCOUNTER — Telehealth: Payer: Self-pay

## 2011-01-30 NOTE — Telephone Encounter (Signed)

## 2011-02-03 ENCOUNTER — Encounter: Payer: Self-pay | Admitting: Internal Medicine

## 2011-02-09 ENCOUNTER — Ambulatory Visit (INDEPENDENT_AMBULATORY_CARE_PROVIDER_SITE_OTHER): Payer: Medicare Other | Admitting: Internal Medicine

## 2011-02-09 DIAGNOSIS — I10 Essential (primary) hypertension: Secondary | ICD-10-CM

## 2011-02-09 DIAGNOSIS — K219 Gastro-esophageal reflux disease without esophagitis: Secondary | ICD-10-CM

## 2011-02-09 DIAGNOSIS — K222 Esophageal obstruction: Secondary | ICD-10-CM

## 2011-02-09 MED ORDER — OMEPRAZOLE 40 MG PO CPDR: 40.0000 mg | DELAYED_RELEASE_CAPSULE | Freq: Every day | ORAL | Status: DC

## 2011-02-09 MED ORDER — SUCRALFATE 1 GM/10ML PO SUSP: 1.0000 g | Freq: Four times a day (QID) | ORAL | Status: DC

## 2011-02-09 NOTE — Assessment & Plan Note (Signed)
BP Readings from Last 3 Encounters:  02/09/11 136/82  01/27/11 152/70  11/26/10 144/82   Adequate control on present medications.

## 2011-02-09 NOTE — Assessment & Plan Note (Signed)
Patient with SSCP at night that is c/w reflux. She does not have symptoms to suggest cardiac nature of disease: is has no pain with exertion, no radiation and no SOB. She had a normal nuclear med study in Nov '10  Plan - reduce prilosec to 40 mg q AM           Add carafate as suspension ac/hs

## 2011-02-09 NOTE — Progress Notes (Signed)
Subjective:    Patient ID: [name], female    DOB: [birth year], 77 y.o.   MRN: [id number]  HPI Mrs. Oleksak was recently seen by Dr. Juanda Chance for nocturnal substernal chest pain that occurred several times a week. She did not have any daytime discomfort, no exertional chest pain, no SOB or decreased exercise tolerance. She did come to EGD revealing mild gastritis, esophagitis and a mild stricture. Since her procedure she has had less discomfort. She has seen a GI doctor in [place] who has her on prilosec 40 mg bid.  Chart reviewed: patient had a nuclear stress study Nov '10 that was a normal study!  Past Medical History  Diagnosis Date  . Glaucoma   . Allergy, unspecified not elsewhere classified   . Asthma   . HTN (hypertension)   . Hyperlipidemia   . GERD (gastroesophageal reflux disease)   . Diverticulosis of colon (without mention of hemorrhage)   . History of GI diverticular bleed   . DJD (degenerative joint disease)   . Anxiety   . Diverticulitis   . LPRD (laryngopharyngeal reflux disease)   . Cataract    Past Surgical History  Procedure Date  . Total abdominal hysterectomy     fibroid tumor  . Bladder repair     after perforation  . Kidney surgery   . Knee arthroscopy 1998    left  . Cataract surgery 2011    w/ IOL Nile Riggs)   Family History  Problem Relation Age of Onset  . Other Mother     bladder tumor  . Colon cancer Neg Hx   . Breast cancer Neg Hx   . Diabetes Neg Hx   . Heart disease Father     ??  . Arthritis Mother    History   Social History  . Marital Status: Widowed    Spouse Name: N/A    Number of Children: 4  . Years of Education: N/A   Occupational History  . 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 Smoking status: Never Smoker   . Smokeless tobacco: Never Used  . Alcohol Use: No  . Drug Use: No  . Sexually Active: Not on file   Other Topics Concern  . Not on file   Social History Narrative   2 sons > '53,  '622 daughters > '59, '619 granddaughters11 great-grandchildren       Review of Systems System review is negative for any constitutional, cardiac, pulmonary, GI or neuro symptoms or complaints other than as described in the HPI.     Objective:   Physical Exam Vitals noted Gen'l- overweight AA woman in no acute distress Pulm - normal respirations Cor - RRR Abd- non-tender.       Assessment & Plan:

## 2011-02-09 NOTE — Patient Instructions (Signed)
Substernal discomfort mostly at night. Symptoms are not suggestive of heart pain. You had a normal stress test in Nov '10.  Plan - reduce prilosec 40 mg to once in the AM before breakfast           carafate 10 cc before meals and bedtime. As you do better we will try to get this medicine down to twice a day.

## 2011-02-19 ENCOUNTER — Telehealth: Payer: Self-pay | Admitting: *Deleted

## 2011-02-19 NOTE — Telephone Encounter (Signed)
Pt informed she will call if she develops a fever or SOB

## 2011-02-19 NOTE — Telephone Encounter (Signed)
May continue cough syrup. Call for fever, SOB

## 2011-02-19 NOTE — Telephone Encounter (Signed)
Pt called and states that she developed a cough yesterday (productive cough) pt has been using cough syrup with hydrocodone prn.  She states she has not been running a fever just productive cough with weakness and fatigue.  Pt wanted to let MD know

## 2011-02-27 ENCOUNTER — Telehealth: Payer: Self-pay | Admitting: *Deleted

## 2011-02-27 DIAGNOSIS — K222 Esophageal obstruction: Secondary | ICD-10-CM

## 2011-02-27 NOTE — Telephone Encounter (Signed)
Pt states she was in office x2 wks ago and was given medication for stomach issues; c/o still having stomach problems. Please advise.

## 2011-03-02 NOTE — Telephone Encounter (Signed)
Pt informed and is agreeable to GI referral. Pt aware that [facility] will call her with appt information.

## 2011-03-02 NOTE — Telephone Encounter (Signed)
Referral placed with [facility]

## 2011-03-02 NOTE — Telephone Encounter (Signed)
Reviewed last office note: was started on carafate at last visit. If she is still having problems on omeprazole and carafate will need to see GI -

## 2011-03-03 ENCOUNTER — Other Ambulatory Visit: Payer: Self-pay | Admitting: Internal Medicine

## 2011-03-31 ENCOUNTER — Ambulatory Visit: Payer: Medicare Other | Admitting: Internal Medicine

## 2011-04-06 ENCOUNTER — Encounter: Payer: Self-pay | Admitting: *Deleted

## 2011-04-14 ENCOUNTER — Encounter: Payer: Self-pay | Admitting: Internal Medicine

## 2011-04-14 ENCOUNTER — Ambulatory Visit (INDEPENDENT_AMBULATORY_CARE_PROVIDER_SITE_OTHER): Payer: Medicare Other | Admitting: Internal Medicine

## 2011-04-14 VITALS — BP 134/68 | HR 58 | Ht 62.0 in | Wt 173.0 lb

## 2011-04-14 DIAGNOSIS — K219 Gastro-esophageal reflux disease without esophagitis: Secondary | ICD-10-CM

## 2011-04-14 NOTE — Progress Notes (Signed)
[name] 1933-12-30 MRN [id number]    History of Present Illness:  This is a 78 year old African American female with a recent diverticular bleed. She was also recently evaluated for gastroesophageal reflux with an upper endoscopy in November 2012 which showed a mild stricture that was dilated with a 48 [place] Maloney dilator. She has been on Prilosec 20 mg daily with complete control of her symptoms. Her last colonoscopy in October 2009 showed moderately severe diverticulosis. She was having a lot of reflux symptoms around Christmas 2012 and was given Carafate slurry 4 times a day by Dr.Norins. She currently has no complaints. All symptoms have resolved.   Past Medical History  Diagnosis Date  . Glaucoma   . Allergy, unspecified not elsewhere classified   . Asthma   . HTN (hypertension)   . Hyperlipidemia   . GERD (gastroesophageal reflux disease)   . Diverticulosis of colon (without mention of hemorrhage)   . History of GI diverticular bleed   . DJD (degenerative joint disease)   . Anxiety   . Diverticulitis   . LPRD (laryngopharyngeal reflux disease)   . Cataract   . Gastritis    Past Surgical History  Procedure Date  . Total abdominal hysterectomy     fibroid tumor  . Bladder repair     after perforation  . Kidney surgery   . Knee arthroscopy 1998    left  . Cataract surgery 2011    w/ IOL Nile Riggs)    reports that she has never smoked. She has never used smokeless tobacco. She reports that she does not drink alcohol or use illicit drugs. family history includes Arthritis in her mother; Heart disease in her father; and Other in her mother.  There is no history of Colon cancer, and Breast cancer, and Diabetes, . Allergies  Allergen Reactions  . Codeine     REACTION: NAUSEA  . Guaifenesin     REACTION: blacks out, feels wierd  . Symbicort     REACTION: causes her to lose her voice        Review of Systems: Denies hoarseness cough, dysphagia or  odynophagia  The remainder of the 10 point ROS is negative except as outlined in H&P   Physical Exam: General appearance  Well developed, in no distress. Psychological normal mood and affect.  Assessment and Plan:  Problem #2 Her gastroesophageal reflux is currently under good control with Prilosec 20 mg daily. She will continue antireflux measures. We will see her when necessary.  Problem #2 History of diverticular bleed. She is stable taking Metamucil 1 teaspoon daily. She stays on a high fiber diet. There are no plans for a recall colonoscopy unless she develops complications.    04/14/2011 Lina Sar

## 2011-04-14 NOTE — Patient Instructions (Signed)
Follow up with Dr Juanda Chance in 1 year. CC: Dr Debby Bud

## 2011-04-27 ENCOUNTER — Other Ambulatory Visit: Payer: Self-pay | Admitting: Internal Medicine

## 2011-05-04 ENCOUNTER — Other Ambulatory Visit: Payer: Self-pay | Admitting: Pulmonary Disease

## 2011-05-07 DIAGNOSIS — H409 Unspecified glaucoma: Secondary | ICD-10-CM | POA: Diagnosis not present

## 2011-05-07 DIAGNOSIS — H4011X0 Primary open-angle glaucoma, stage unspecified: Secondary | ICD-10-CM | POA: Diagnosis not present

## 2011-05-20 ENCOUNTER — Other Ambulatory Visit: Payer: Self-pay | Admitting: Internal Medicine

## 2011-06-23 ENCOUNTER — Other Ambulatory Visit: Payer: Self-pay | Admitting: Internal Medicine

## 2011-06-30 ENCOUNTER — Other Ambulatory Visit: Payer: Self-pay | Admitting: Internal Medicine

## 2011-07-27 ENCOUNTER — Other Ambulatory Visit: Payer: Self-pay | Admitting: Pulmonary Disease

## 2011-08-20 ENCOUNTER — Encounter: Payer: Self-pay | Admitting: Internal Medicine

## 2011-08-20 ENCOUNTER — Other Ambulatory Visit (INDEPENDENT_AMBULATORY_CARE_PROVIDER_SITE_OTHER): Payer: Medicare Other

## 2011-08-20 ENCOUNTER — Ambulatory Visit (INDEPENDENT_AMBULATORY_CARE_PROVIDER_SITE_OTHER): Payer: Medicare Other | Admitting: Internal Medicine

## 2011-08-20 VITALS — BP 154/74 | HR 78 | Temp 98.2°F | Resp 16 | Ht 62.0 in | Wt 174.0 lb

## 2011-08-20 DIAGNOSIS — M1990 Unspecified osteoarthritis, unspecified site: Secondary | ICD-10-CM | POA: Diagnosis not present

## 2011-08-20 DIAGNOSIS — K219 Gastro-esophageal reflux disease without esophagitis: Secondary | ICD-10-CM

## 2011-08-20 DIAGNOSIS — I10 Essential (primary) hypertension: Secondary | ICD-10-CM

## 2011-08-20 DIAGNOSIS — Z23 Encounter for immunization: Secondary | ICD-10-CM

## 2011-08-20 DIAGNOSIS — T7840XA Allergy, unspecified, initial encounter: Secondary | ICD-10-CM

## 2011-08-20 DIAGNOSIS — E785 Hyperlipidemia, unspecified: Secondary | ICD-10-CM | POA: Diagnosis not present

## 2011-08-20 DIAGNOSIS — Z0000 Encounter for general adult medical examination without abnormal findings: Secondary | ICD-10-CM

## 2011-08-20 DIAGNOSIS — F411 Generalized anxiety disorder: Secondary | ICD-10-CM

## 2011-08-20 LAB — CBC WITH DIFFERENTIAL/PLATELET
Basophils Relative: 0.6 % (ref 0.0–3.0)
Eosinophils Relative: 8.2 % — ABNORMAL HIGH (ref 0.0–5.0)
Hemoglobin: 12.8 g/dL (ref 12.0–15.0)
Lymphocytes Relative: 32.8 % (ref 12.0–46.0)
Monocytes Relative: 7.2 % (ref 3.0–12.0)
Neutro Abs: 3.3 10*3/uL (ref 1.4–7.7)
Neutrophils Relative %: 51.2 % (ref 43.0–77.0)
RBC: 4.66 Mil/uL (ref 3.87–5.11)
WBC: 6.5 10*3/uL (ref 4.5–10.5)

## 2011-08-20 LAB — COMPREHENSIVE METABOLIC PANEL
AST: 17 U/L (ref 0–37)
BUN: 9 mg/dL (ref 6–23)
CO2: 32 mEq/L (ref 19–32)
Calcium: 9.3 mg/dL (ref 8.4–10.5)
Chloride: 110 mEq/L (ref 96–112)
Creatinine, Ser: 0.8 mg/dL (ref 0.4–1.2)
GFR: 95.2 mL/min (ref >60.00)
Glucose, Bld: 87 mg/dL (ref 70–99)

## 2011-08-20 LAB — LIPID PANEL
Cholesterol: 163 mg/dL (ref 0–200)
LDL Cholesterol: 86 mg/dL (ref 0–99)
Triglycerides: 70 mg/dL (ref 0.0–149.0)

## 2011-08-20 MED ORDER — LEVALBUTEROL TARTRATE 45 MCG/ACT IN AERO: 1.0000 | INHALATION_SPRAY | Freq: Every day | RESPIRATORY_TRACT | Status: DC

## 2011-08-20 NOTE — Assessment & Plan Note (Signed)
BP Readings from Last 3 Encounters:  08/20/11 154/74  04/14/11 134/68  02/09/11 136/82   Good control in the main.  Plan - routine lab today  Continue present medications.

## 2011-08-20 NOTE — Patient Instructions (Signed)
Looking good. Please go to a pharmacy that offers shingles vaccine - your medicare part D should cover the cost of the vaccine for you.  Will get routine lab today and a report will follow.   Try to work in a good 30 min walk 3 times a week.  Come see me for any problem at any time.

## 2011-08-20 NOTE — Progress Notes (Signed)
Subjective:    Patient ID: [name], female    DOB: Jan 24, 1934, 78 y.o.   MRN: [id number]  HPI [name]  is here for annual Medicare wellness examination and management of other chronic and acute problems. Feeling well. GERD is well controlled.   The risk factors are reflected in the social history.  The roster of all physicians providing medical care to patient - is listed in the Snapshot section of the chart.  Activities of daily living:  The patient is 100% inedpendent in all ADLs: dressing, toileting, feeding as well as independent mobility  Home safety : The patient has smoke detectors in the home. Falls - has grab bars in the shower and home is free of clutter. They wear seatbelts. No firearms at home   There is no risks for hepatitis, STDs or HIV. There is no   history of blood transfusion. They have no travel history to infectious disease endemic areas of the world.  The patient has seen their dentist in the last 12 months. They have seen their eye doctor in the last year. They deny any hearing difficulty and have not had audiologic testing in the last year.  They do not  have excessive sun exposure. Discussed the need for sun protection: hats, long sleeves and use of sunscreen if there is significant sun exposure.   Diet: the importance of a healthy diet is discussed. They do have a healthy  diet.  The patient has no regular exercise program.  The benefits of regular aerobic exercise were discussed.  Depression screen: there are no signs or vegative symptoms of depression- irritability, change in appetite, anhedonia, sadness/tearfullness.  Cognitive assessment: the patient manages all their financial and personal affairs and is actively engaged.   The following portions of the patient's history were reviewed and updated as appropriate: allergies, current medications, past family history, past medical history,  past surgical history, past social history  and problem  list.  Vision, hearing, body mass index were assessed and reviewed.   During the course of the visit the patient was educated and counseled about appropriate screening and preventive services including : fall prevention , diabetes screening, nutrition counseling, colorectal cancer screening, and recommended immunizations.  Past Medical History  Diagnosis Date  . Glaucoma   . Allergy, unspecified not elsewhere classified   . Asthma   . HTN (hypertension)   . Hyperlipidemia   . GERD (gastroesophageal reflux disease)   . Diverticulosis of colon (without mention of hemorrhage)   . History of GI diverticular bleed   . DJD (degenerative joint disease)   . Anxiety   . Diverticulitis   . LPRD (laryngopharyngeal reflux disease)   . Cataract   . Gastritis    Past Surgical History  Procedure Date  . Total abdominal hysterectomy     fibroid tumor  . Bladder repair     after perforation  . Kidney surgery   . Knee arthroscopy 1998    left  . Cataract surgery 2011    w/ IOL Nile Riggs)   Family History  Problem Relation Age of Onset  . Other Mother     bladder tumor  . Colon cancer Neg Hx   . Breast cancer Neg Hx   . Diabetes Neg Hx   . Heart disease Father     ??  . Arthritis Mother    History   Social History  . Marital Status: Widowed    Spouse Name: N/A    Number  of Children: 4  . Years of Education: N/A   Occupational History  . 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 Smoking status: Never Smoker   . Smokeless tobacco: Never Used  . Alcohol Use: No  . Drug Use: No  . Sexually Active: Not on file   Other Topics Concern  . Not on file   Social History Narrative   2 sons > '53, '622 daughters > '59, '619 granddaughters11 great-grandchildren      Review of Systems Constitutional:  Negative for fever, chills, activity change and unexpected weight change.  HEENT:  Negative for hearing loss, ear pain, congestion, neck stiffness and postnasal  drip. Negative for sore throat or swallowing problems. Negative for dental complaints.   Eyes: Negative for vision loss or change in visual acuity.  Respiratory: Negative for chest tightness and wheezing. Negative for DOE.   Cardiovascular: Negative for chest pain or palpitations. No decreased exercise tolerance Gastrointestinal: No change in bowel habit. No bloating or gas. No reflux or indigestion Genitourinary: Negative for urgency, frequency, flank pain and difficulty urinating.  Musculoskeletal: Negative for myalgias, back pain, arthralgias and gait problem.  Neurological: Negative for dizziness, tremors, weakness and headaches.  Hematological: Negative for adenopathy.  Psychiatric/Behavioral: Negative for behavioral problems and dysphoric mood.       Objective:   Physical Exam Filed Vitals:   08/20/11 0926  BP: 154/74  Pulse: 78  Temp: 98.2 F (36.8 C)  Resp: 16  Weight: 174 lb (78.926 kg)   Gen'l: well nourished, well developed AA woman in no distress, looking younger than her stated age HEENT - [place]/AT, EACs/TMs normal, oropharynx with full dentures, no buccal lesions, posterior pharynx clear, mucous membranes moist. C&S clear, PERRLA Neck - supple, no thyromegaly Nodes- negative submental, cervical, supraclavicular regions Chest - no deformity, no CVAT Lungs - cleat without rales, wheezes. No increased work of breathing Breast - deferred to mammography Cardiovascular - regular rate and rhythm, quiet precordium, no murmurs, rubs or gallops, 2+ radial, DP and PT pulses Abdomen - BS+ x 4, no HSM, no guarding or rebound or tenderness Pelvic - deferred  Rectal - deferred to GI Extremities - no clubbing, cyanosis, edema or deformity.  Neuro - A&O x 3, CN II-XII normal, motor strength normal and equal, DTRs 2+ and symmetrical biceps, radial, and patellar tendons. Cerebellar - no tremor, no rigidity, fluid movement and normal gait. Derm - Head, neck, back, abdomen and extremities  without suspicious lesions  Lab Results  Component Value Date   WBC 6.5 08/20/2011   HGB 12.8 08/20/2011   HCT 40.0 08/20/2011   PLT 273.0 08/20/2011   GLUCOSE 87 08/20/2011   CHOL 163 08/20/2011   TRIG 70.0 08/20/2011   HDL 63.20 08/20/2011   LDLCALC 86 08/20/2011   ALT 12 08/20/2011   AST 17 08/20/2011   NA 145 08/20/2011   K 4.8 08/20/2011   CL 110 08/20/2011   CREATININE 0.8 08/20/2011   BUN 9 08/20/2011   CO2 32 08/20/2011   TSH 1.21 11/12/2010           Assessment & Plan:

## 2011-08-20 NOTE — Assessment & Plan Note (Signed)
She is doing well although she does need extra doses of Carafate prn.   Plan- continue present regimen

## 2011-08-20 NOTE — Assessment & Plan Note (Addendum)
For lab today with recommendations to follow.  Addendum - lipid panel reflects excellent control.  Plan  Continue zetia.

## 2011-08-20 NOTE — Assessment & Plan Note (Signed)
Doing well w/o limitations

## 2011-08-20 NOTE — Assessment & Plan Note (Signed)
Doing well. Needs refill Xopenex. She will be seeing Dr. Trudee Kuster in the future since Dr. Henrene Hawking retirement

## 2011-08-23 NOTE — Assessment & Plan Note (Signed)
Interval medical history is benign. Physical exam, sans breast and pelvic, is normal . Lab results - good lipid control. She is current with colorectal cancer screening, breast cancer screening. Immunizations are up to date.  In summary - a delightful woman who is medically stable. She does remain active. She is encouraged to work on [employer] management - to loose 25lbs. She will return as needed.

## 2011-08-23 NOTE — Assessment & Plan Note (Signed)
Doing very well - no active symptoms

## 2011-08-24 DIAGNOSIS — Z23 Encounter for immunization: Secondary | ICD-10-CM | POA: Insufficient documentation

## 2011-08-24 MED ORDER — TETANUS-DIPHTHERIA TOXOIDS TD 2-2 LF/0.5ML IM SUSP: 0.5000 mL | Freq: Once | INTRAMUSCULAR | Status: DC

## 2011-08-24 MED ORDER — TETANUS-DIPHTHERIA TOXOIDS TD 2-2 LF/0.5ML IM SUSP: 0.5000 mL | Freq: Once | INTRAMUSCULAR | Status: AC

## 2011-08-24 NOTE — Progress Notes (Signed)
Addended by: Elnora Morrison on: 08/24/2011 08:11 AM   Modules accepted: Orders

## 2011-08-27 DIAGNOSIS — H409 Unspecified glaucoma: Secondary | ICD-10-CM | POA: Diagnosis not present

## 2011-08-27 DIAGNOSIS — H4011X0 Primary open-angle glaucoma, stage unspecified: Secondary | ICD-10-CM | POA: Diagnosis not present

## 2011-08-31 ENCOUNTER — Other Ambulatory Visit: Payer: Self-pay | Admitting: Internal Medicine

## 2011-08-31 ENCOUNTER — Encounter: Payer: Self-pay | Admitting: Internal Medicine

## 2011-09-03 ENCOUNTER — Other Ambulatory Visit: Payer: Self-pay | Admitting: *Deleted

## 2011-09-03 ENCOUNTER — Other Ambulatory Visit: Payer: Self-pay | Admitting: Internal Medicine

## 2011-09-03 MED ORDER — EZETIMIBE 10 MG PO TABS: 10.0000 mg | ORAL_TABLET | Freq: Every day | ORAL | Status: DC

## 2011-09-03 NOTE — Telephone Encounter (Signed)
Rx for zetia sent to [facility]

## 2011-09-29 ENCOUNTER — Other Ambulatory Visit: Payer: Self-pay | Admitting: Internal Medicine

## 2011-10-15 ENCOUNTER — Other Ambulatory Visit: Payer: Self-pay | Admitting: Internal Medicine

## 2011-10-15 MED ORDER — BUDESONIDE 180 MCG/ACT IN AEPB: 1.0000 | INHALATION_SPRAY | Freq: Two times a day (BID) | RESPIRATORY_TRACT | Status: DC

## 2011-10-15 NOTE — Telephone Encounter (Signed)
Refill on med to [facility] pharmacy, pulmicort inhaler. Eber Jones to call patient and discuss insurance concerning her son [name].

## 2011-10-15 NOTE — Telephone Encounter (Signed)
The pt is hoping to get a call back to discuss her medicine.   Her call back number is [phone]

## 2011-10-15 NOTE — Telephone Encounter (Signed)
REFILL ON MEDS SENT TO [facility]

## 2011-10-27 ENCOUNTER — Other Ambulatory Visit: Payer: Self-pay | Admitting: Internal Medicine

## 2011-10-30 ENCOUNTER — Other Ambulatory Visit: Payer: Self-pay | Admitting: Internal Medicine

## 2011-11-06 ENCOUNTER — Telehealth: Payer: Self-pay | Admitting: Internal Medicine

## 2011-11-06 NOTE — Telephone Encounter (Signed)
Pt called and stated that insurance is not covering PULMICORT and would like Dr. Debby Bud to give her something else that insurance will cover. Please advise. Please call pt back from the nurse.

## 2011-11-11 NOTE — Telephone Encounter (Signed)
Please ask pateint to check her formulary for which pulmonary medications are covered.

## 2011-11-12 DIAGNOSIS — J45909 Unspecified asthma, uncomplicated: Secondary | ICD-10-CM | POA: Diagnosis not present

## 2011-11-12 DIAGNOSIS — J3089 Other allergic rhinitis: Secondary | ICD-10-CM | POA: Diagnosis not present

## 2011-11-12 DIAGNOSIS — R062 Wheezing: Secondary | ICD-10-CM | POA: Diagnosis not present

## 2011-11-12 DIAGNOSIS — J301 Allergic rhinitis due to pollen: Secondary | ICD-10-CM | POA: Diagnosis not present

## 2011-11-12 NOTE — Telephone Encounter (Signed)
Pt's daughter advised of MD's recommendation and will inform pt who will advise once she knows.

## 2011-11-18 ENCOUNTER — Other Ambulatory Visit: Payer: Self-pay | Admitting: Internal Medicine

## 2011-12-01 ENCOUNTER — Other Ambulatory Visit: Payer: Self-pay | Admitting: Internal Medicine

## 2011-12-24 DIAGNOSIS — H409 Unspecified glaucoma: Secondary | ICD-10-CM | POA: Diagnosis not present

## 2011-12-24 DIAGNOSIS — H2510 Age-related nuclear cataract, unspecified eye: Secondary | ICD-10-CM | POA: Diagnosis not present

## 2011-12-24 DIAGNOSIS — H4011X0 Primary open-angle glaucoma, stage unspecified: Secondary | ICD-10-CM | POA: Diagnosis not present

## 2011-12-24 DIAGNOSIS — Z961 Presence of intraocular lens: Secondary | ICD-10-CM | POA: Diagnosis not present

## 2012-01-07 DIAGNOSIS — J384 Edema of larynx: Secondary | ICD-10-CM | POA: Diagnosis not present

## 2012-01-07 DIAGNOSIS — K219 Gastro-esophageal reflux disease without esophagitis: Secondary | ICD-10-CM | POA: Diagnosis not present

## 2012-01-24 ENCOUNTER — Other Ambulatory Visit: Payer: Self-pay | Admitting: Internal Medicine

## 2012-01-25 ENCOUNTER — Other Ambulatory Visit: Payer: Self-pay | Admitting: Pulmonary Disease

## 2012-01-25 DIAGNOSIS — Z23 Encounter for immunization: Secondary | ICD-10-CM | POA: Diagnosis not present

## 2012-02-01 DIAGNOSIS — R05 Cough: Secondary | ICD-10-CM | POA: Diagnosis not present

## 2012-02-01 DIAGNOSIS — J45909 Unspecified asthma, uncomplicated: Secondary | ICD-10-CM | POA: Diagnosis not present

## 2012-02-01 DIAGNOSIS — J301 Allergic rhinitis due to pollen: Secondary | ICD-10-CM | POA: Diagnosis not present

## 2012-02-01 DIAGNOSIS — J3089 Other allergic rhinitis: Secondary | ICD-10-CM | POA: Diagnosis not present

## 2012-02-16 ENCOUNTER — Other Ambulatory Visit: Payer: Self-pay | Admitting: Internal Medicine

## 2012-02-19 ENCOUNTER — Other Ambulatory Visit: Payer: Self-pay | Admitting: *Deleted

## 2012-02-19 ENCOUNTER — Other Ambulatory Visit: Payer: Self-pay | Admitting: Internal Medicine

## 2012-02-24 ENCOUNTER — Other Ambulatory Visit: Payer: Self-pay | Admitting: *Deleted

## 2012-02-29 DIAGNOSIS — R0602 Shortness of breath: Secondary | ICD-10-CM | POA: Diagnosis not present

## 2012-02-29 DIAGNOSIS — J301 Allergic rhinitis due to pollen: Secondary | ICD-10-CM | POA: Diagnosis not present

## 2012-02-29 DIAGNOSIS — R05 Cough: Secondary | ICD-10-CM | POA: Diagnosis not present

## 2012-02-29 DIAGNOSIS — J3089 Other allergic rhinitis: Secondary | ICD-10-CM | POA: Diagnosis not present

## 2012-02-29 DIAGNOSIS — J45909 Unspecified asthma, uncomplicated: Secondary | ICD-10-CM | POA: Diagnosis not present

## 2012-03-02 ENCOUNTER — Ambulatory Visit (INDEPENDENT_AMBULATORY_CARE_PROVIDER_SITE_OTHER)
Admission: RE | Admit: 2012-03-02 | Discharge: 2012-03-02 | Disposition: A | Discharge status: Home / Self Care | Payer: Medicare Other | Source: Ambulatory Visit | Attending: Adult Health | Admitting: Adult Health

## 2012-03-02 ENCOUNTER — Ambulatory Visit: Payer: Medicare Other | Admitting: Internal Medicine

## 2012-03-02 ENCOUNTER — Encounter: Payer: Self-pay | Admitting: Adult Health

## 2012-03-02 ENCOUNTER — Ambulatory Visit (INDEPENDENT_AMBULATORY_CARE_PROVIDER_SITE_OTHER): Payer: Medicare Other | Admitting: Adult Health

## 2012-03-02 VITALS — BP 122/84 | HR 60 | Temp 98.4°F | Ht 62.0 in | Wt 173.2 lb

## 2012-03-02 DIAGNOSIS — R0989 Other specified symptoms and signs involving the circulatory and respiratory systems: Secondary | ICD-10-CM | POA: Diagnosis not present

## 2012-03-02 DIAGNOSIS — J45909 Unspecified asthma, uncomplicated: Secondary | ICD-10-CM | POA: Diagnosis not present

## 2012-03-02 DIAGNOSIS — R0600 Dyspnea, unspecified: Secondary | ICD-10-CM

## 2012-03-02 DIAGNOSIS — R0609 Other forms of dyspnea: Secondary | ICD-10-CM | POA: Diagnosis not present

## 2012-03-02 DIAGNOSIS — R0602 Shortness of breath: Secondary | ICD-10-CM | POA: Diagnosis not present

## 2012-03-02 NOTE — Progress Notes (Signed)
Subjective:    Patient ID: [name], female    DOB: 27-Jun-1933, 78 y.o.   MRN: [id number]  HPI 78 yo with known hx of asthma, HTN, GERD and DJD.   ~ April 30, 2010:  [name] is currently improved & approaching her baseline but had quite a detour over the last 6+months> started w/ "an asthma attack" 7/11 & seen by MR- rx w/ Avelox/ Pred & changed from Pulmicort/ Proair to Symbicort which she felt was too strong for her; developed laryngitis w/ hoarseness & dysphonia, sent by DrJoe to DrESL & then to DrTeoh for ENT; eventually referred to [facility] DrLintzenich w/ dx of LER & laryngeal edema- rx w/ Omep40mg Bid; pt states "my voice just came back 2 weeks ago".  Coincidentally she called w/ a 1 week hx cough, worse at night, congestion, sm amt beige sputum, denies f/c/s, notes mild dyspnea & chest discomfort; ZPak & Pred taper was called in & some better but still c/o noct cough (Delsym causes diarrhea, Tussionex too $$, we discussed Hydromet Prn); **we discussed finishing the ZPak, Pred, adding Mucinex, Fluids, & Hydromet Prn; she is back on the Pulmicort & Proair.  Other medical problems followed & managed by DrNorins (see below)...  07/18/10 Acute office visit.  Complains of dry cough, chest congestion, tightness in chest, increased SOB x6days . Has some drainage. No fever. Using allegra without much help. Seems her breathing is not as good as pollen has been bad outside. Seems last week she started her symptoms with stuffiness, malaise. Cough is getting worse. Last night she coughed most of night. Using hydromet for cough which helps.   Using pulmicort everyday no missed doses .      03/02/12 Acute OV  Complains of increased asthma symptoms with w/ wheezing, increased SOB, dry cough, chest congestion "off and on" x2 months.  has been seeing Dr Eileen Stanford w/ abx and pred rx couple  Of days ago. Does feel some bettter but still has cough.  No fever, chest pain or edema.    Review of  Systems Constitutional:   No  weight loss, night sweats,  Fevers, chills,  +fatigue, or  lassitude.  HEENT:   No headaches,  Difficulty swallowing,  Tooth/dental problems, or  Sore throat,                No sneezing, itching, ear ache,  +nasal congestion, post nasal drip,   CV:  No chest pain,  Orthopnea, PND, swelling in lower extremities, anasarca, dizziness, palpitations, syncope.   GI  No heartburn, indigestion, abdominal pain, nausea, vomiting, diarrhea, change in bowel habits, loss of appetite, bloody stools.   Resp:    No coughing up of blood.  Marland Kitchen  No chest wall deformity  Skin: no rash or lesions.  GU: no dysuria, change in color of urine, no urgency or frequency.  No flank pain, no hematuria   MS:  No joint pain or swelling.  No decreased range of motion.  No back pain.  Psych:  No change in mood or affect. No depression or anxiety.  No memory loss.         Objective:   Physical Exam GEN: A/Ox3; pleasant , NAD  HEENT:  [place]/AT,  EACs-clear, TMs-wnl, NOSE-clear, THROAT-clear, no lesions, no postnasal drip or exudate noted.   NECK:  Supple w/ fair ROM; no JVD; normal carotid impulses w/o bruits; no thyromegaly or nodules palpated; no lymphadenopathy.  RESP  Decreased BS in bases .no accessory muscle  use, no dullness to percussion  CARD:  RRR, no m/r/g  , no peripheral edema, pulses intact, no cyanosis or clubbing.  GI:   Soft & nt; nml bowel sounds; no organomegaly or masses detected.  Musco: Warm bil, no deformities or joint swelling noted.   Neuro: alert, no focal deficits noted.    Skin: Warm, no lesions or rashes         Assessment & Plan:

## 2012-03-02 NOTE — Patient Instructions (Addendum)
Continue on QVAR 2 puffs Twice daily  , brush , rinse and gargle after use Finish the Medrol dose pack  I will call with xray results.  follow up Dr. Marchelle Gearing in 4 weeks with PFT (lung function test)  Please contact office for sooner follow up if symptoms do not improve or worsen or seek emergency care

## 2012-03-04 ENCOUNTER — Encounter: Payer: Self-pay | Admitting: Adult Health

## 2012-03-04 NOTE — Assessment & Plan Note (Signed)
Flare with URI  Xray pending   Plan  Continue on QVAR 2 puffs Twice daily  , brush , rinse and gargle after use Finish the Medrol dose pack    follow up Dr. Marchelle Gearing in 4 weeks with PFT (lung function test)  Please contact office for sooner follow up if symptoms do not improve or worsen or seek emergency care

## 2012-03-15 ENCOUNTER — Other Ambulatory Visit: Payer: Self-pay | Admitting: Internal Medicine

## 2012-03-20 ENCOUNTER — Other Ambulatory Visit: Payer: Self-pay | Admitting: Internal Medicine

## 2012-03-23 ENCOUNTER — Other Ambulatory Visit: Payer: Self-pay | Admitting: Internal Medicine

## 2012-03-25 ENCOUNTER — Encounter: Payer: Self-pay | Admitting: *Deleted

## 2012-03-28 ENCOUNTER — Encounter: Payer: Self-pay | Admitting: Cardiology

## 2012-03-28 ENCOUNTER — Ambulatory Visit (INDEPENDENT_AMBULATORY_CARE_PROVIDER_SITE_OTHER): Payer: Medicare Other | Admitting: Cardiology

## 2012-03-28 VITALS — BP 142/88 | HR 60 | Ht 62.0 in | Wt 173.0 lb

## 2012-03-28 DIAGNOSIS — R0989 Other specified symptoms and signs involving the circulatory and respiratory systems: Secondary | ICD-10-CM | POA: Diagnosis not present

## 2012-03-28 DIAGNOSIS — R0609 Other forms of dyspnea: Secondary | ICD-10-CM | POA: Diagnosis not present

## 2012-03-28 DIAGNOSIS — R0600 Dyspnea, unspecified: Secondary | ICD-10-CM | POA: Insufficient documentation

## 2012-03-28 DIAGNOSIS — R079 Chest pain, unspecified: Secondary | ICD-10-CM

## 2012-03-28 DIAGNOSIS — R011 Cardiac murmur, unspecified: Secondary | ICD-10-CM

## 2012-03-28 NOTE — Progress Notes (Signed)
Patient ID: [name], female   DOB: [birth year], 79 y.o.   MRN: [id number] PCP: Dr. Debby Bud  79 yo with history of HTN and allergic rhinitis with asthma presents for cardiology evaluation.  She has no history of cardiac problems.  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  She was sent here today by her allergist who wants to make sure that her dyspnea is due to her asthma and not from a cardiac source.    Since September, she has had more dyspnea than typical.  She has seen her allergist a number of times.  The dyspnea will come and go.  When she is having a flare of the dyspnea, she gets short of breath after walking 50 feet.  She does not hear herself wheezing.  She is not short of breath between flares.  No chest pain, no leg edema, no lightheadedness or syncope.  She did, of note, have a stress test in 2010.  I cannot find the result in the chart but she says it was normal.    ECG: NSR, nonspecific lateral and anterolateral T wave flattening   Labs (6/13): LDL 86, HDL 63, K 4.8, creatinine 0.8  PMH: 1. Hyperlipidemia 2. HTN 3. GERD 4. Asthma 5. Allergic rhinitis 6. Glaucoma 7. Diverticulosis with history of diverticular bleed 8. Osteoarthritis 9. Allergic rhinitis 10. TAH (fibroids)  SH: Widow, nonsmoker.  Still working as Advertising copywriter.  4 children.   FH: No premature CAD.  ROS: All systems reviewed and negative except as per HPI.  Current Outpatient Prescriptions  Medication Sig Dispense Refill  . amLODipine (NORVASC) 5 MG tablet take 1 tablet by mouth once daily  30 tablet  5  . ASTEPRO 0.15 % SOLN instill 1 to 2 sprays into each nostril once daily  30 mL  6  . azelastine (ASTELIN) 137 MCG/SPRAY nasal spray Place 1 spray into the nose 2 (two) times daily. Use in each nostril as directed       . budesonide (PULMICORT FLEXHALER) 180 MCG/ACT inhaler Inhale 1 puff into the lungs 2 (two) times daily.  1 Inhaler  6  . BYSTOLIC 10 MG tablet TAKE 1 TABLET BY MOUTH  ONCE DAILY  30 each  2  . CARAFATE 1 GM/10ML suspension take 2 teaspoonfuls by mouth four times a day  420 mL  3  . chlorpheniramine-HYDROcodone (TUSSIONEX PENNKINETIC ER) 10-8 MG/5ML LQCR Take 5 mLs by mouth every 12 (twelve) hours as needed.  120 mL  5  . diptheria-tetanus toxoids (DECAVAC) 2-2 LF/0.5ML injection Inject 0.5 mLs into the muscle once.  0.5 mL  0  . dorzolamide-timolol (COSOPT) 22.3-6.8 MG/ML ophthalmic solution Place 1 drop into both eyes 3 (three) times daily.        . fexofenadine (ALLEGRA) 180 MG tablet Take 180 mg by mouth daily.        . furosemide (LASIX) 40 MG tablet TAKE 1 TABLET BY MOUTH ONCE DAILY  30 tablet  5  . levalbuterol (XOPENEX HFA) 45 MCG/ACT inhaler Inhale 1-2 puffs into the lungs at bedtime.  1 Inhaler  5  . losartan (COZAAR) 100 MG tablet TAKE 1 TABLET BY MOUTH ONCE DAILY  30 tablet  5  . omeprazole (PRILOSEC) 40 MG capsule Take 1 capsule (40 mg total) by mouth daily.  30 capsule  11  . pilocarpine (PILOPINE HS) 4 % ophthalmic gel Place 1 application into the right eye at bedtime.        Marland Kitchen  PROAIR HFA 108 (90 BASE) MCG/ACT inhaler INHALE 2 PUFFS BY MOUTH EVERY 6 HOURS IF NEEDED  18 g  2  . VOLTAREN 1 % GEL APPLY 4 GMS TO SORE AREAS 4 TIMES A DAY AS NEEDED  100 g  4  . ZETIA 10 MG tablet take 1 tablet by mouth once daily  30 each  5   BP 142/88  Pulse 60  Ht 5\' 2"  (1.575 m)  Wt 173 lb (78.472 kg)  BMI 31.64 kg/m2 General: NAD Neck: No JVD, no thyromegaly or thyroid nodule.  Lungs: Clear to auscultation bilaterally with normal respiratory effort.  No wheezing.  CV: Nondisplaced PMI.  Heart regular S1/S2, no S3/S4, 2/6 early SEM.  No peripheral edema.  No carotid bruit.  Normal pedal pulses.  Abdomen: Soft, nontender, no hepatosplenomegaly, no distention.  Skin: Intact without lesions or rashes.  Neurologic: Alert and oriented x 3.  Psych: Normal affect. Extremities: No clubbing or cyanosis.  HEENT: Normal.   Assessment/Plan: 1. Dyspnea:  Patient has  exertional dyspnea that has been waxing and waning since 9/13.  It may be due to a worsening in her pattern of chronic asthma.  She was sent here by her allergist to rule out cardiac causes as a contributor to her dyspnea.  Her exam is relatively normal.  She has nonspecific ECG changes.   - I will arrange a Lexiscan Sestamibi (she does not think she could walk on a treadmill).  - Start ASA 81 mg daily (this has been ok'd by GI - had prior diverticular bleed).   2. Murmur: Patient has an aortic-area murmur, will get echo to evaluate.  3. HTN: BP mildly elevated today.  Will monitor.   Marca Ancona 03/28/2012

## 2012-03-28 NOTE — Patient Instructions (Addendum)
Your physician has requested that you have an echocardiogram. Echocardiography is a painless test that uses sound waves to create images of your heart. It provides your doctor with information about the size and shape of your heart and how well your heart's chambers and valves are working. This procedure takes approximately one hour. There are no restrictions for this procedure.  Your physician has requested that you have a lexiscan myoview. For further information please visit [email or web address] Please follow instruction sheet, as given.  Your physician recommends that you schedule a follow-up appointment in: 3 weeks with Dr Shirlee Latch.

## 2012-03-31 ENCOUNTER — Telehealth: Payer: Self-pay | Admitting: Cardiology

## 2012-04-04 ENCOUNTER — Encounter: Payer: Self-pay | Admitting: Internal Medicine

## 2012-04-06 ENCOUNTER — Encounter (HOSPITAL_COMMUNITY): Payer: Medicare Other

## 2012-04-08 ENCOUNTER — Other Ambulatory Visit (HOSPITAL_COMMUNITY): Payer: Medicare Other

## 2012-04-11 ENCOUNTER — Ambulatory Visit (INDEPENDENT_AMBULATORY_CARE_PROVIDER_SITE_OTHER): Payer: Medicare Other | Admitting: Internal Medicine

## 2012-04-11 ENCOUNTER — Encounter: Payer: Self-pay | Admitting: Internal Medicine

## 2012-04-11 VITALS — BP 122/82 | HR 63 | Temp 97.8°F | Ht 63.0 in | Wt 175.0 lb

## 2012-04-11 DIAGNOSIS — J45909 Unspecified asthma, uncomplicated: Secondary | ICD-10-CM | POA: Diagnosis not present

## 2012-04-11 DIAGNOSIS — R0609 Other forms of dyspnea: Secondary | ICD-10-CM

## 2012-04-11 DIAGNOSIS — R0600 Dyspnea, unspecified: Secondary | ICD-10-CM

## 2012-04-11 LAB — PULMONARY FUNCTION TEST

## 2012-04-11 NOTE — Progress Notes (Signed)
PFT done today. 

## 2012-04-11 NOTE — Progress Notes (Signed)
Subjective:    Patient ID: [name], female    DOB: 01/05/1934, 79 y.o.   MRN: [id number]  HPI 79 yo with known hx of asthma, HTN, GERD and DJD.   OV 10/07/2009. KNown asthmatic. Generally well controlled on pulmicort 2 puff two times a day with reported good compliance. Last seen 08/06/2009 acutely for acute asthma exacerbtion. Did well after that with abx and pred taper. However, now for past 2 weeks reporting increaseing dry cough and dyspnea. Dyspnea is worse than cough. Dyspnea is progressive but cough is not. Associatd wheezing present. Rates symptoms as moderate in severity. Normallynot dyspneic doing household work but today working at [employer] she wsa dyspneic with stair climbing. Resting wheeze present. Symptoms c/w prior asthma attacks but she is unsure what triggered it. She has been upset about a friend's death from a year ago as May-July marks 1  year anniversary. Also weather is very hot and humid.   She has been compliant with pulmicort but I am not sure she understands what the pro-air is meant for. enies associated fever, nocturnal awakening, hemoptysis, chills, runny nose, hemoptysis, edema, chest pain, palpitations, dizziness, syncope. REcollects normal stress test a year ago  REC  Patient Instructions: 1)  you are in an asthma attack 2)  take avelox one table a day for 5 days 3)   - first dose given today 4)   - start 2nd dose tomorrow 10/08/2009 tuesday 5)  take 12 day prednisone taper starting 10/08/2009 tuesday 6)  take pro-air 2 puff as needed only- very important as needed 7)  stop pulmicort 8)  start symbicort 160/4.5 dose at 2 puff two times a day 9)  use symbicort with spacer - learn technique  10)  return in 7-10 days to see Tammy the nurse 11)  at followup we will rechck your medication technique and spiro 12)  if you get worse call us or go to er  ~ April 30, 2010:  [name] is currently improved & approaching her baseline but had quite a  detour over the last 6+months> started w/ "an asthma attack" 7/11 & seen by MR- rx w/ Avelox/ Pred & changed from Pulmicort/ Proair to Symbicort which she felt was too strong for her; developed laryngitis w/ hoarseness & dysphonia, sent by DrJoe to Dr[facility] & then to DrTeoh for ENT; eventually referred to [facility] DrLintzenich w/ dx of LER & laryngeal edema- rx w/ Omep40mg Bid; pt states "my voice just came back 2 weeks ago".  Coincidentally she called w/ a 1 week hx cough, worse at night, congestion, sm amt beige sputum, denies f/c/s, notes mild dyspnea & chest discomfort; ZPak & Pred taper was called in & some better but still c/o noct cough (Delsym causes diarrhea, Tussionex too $$, we discussed Hydromet Prn); **we discussed finishing the ZPak, Pred, adding Mucinex, Fluids, & Hydromet Prn; she is back on the Pulmicort & Proair.  Other medical problems followed & managed by DrNorins (see below)...  07/18/10 Acute office visit.  Complains of dry cough, chest congestion, tightness in chest, increased SOB x6days . Has some drainage. No fever. Using allegra without much help. Seems her breathing is not as good as pollen has been bad outside. Seems last week she started her symptoms with stuffiness, malaise. Cough is getting worse. Last night she coughed most of night. Using hydromet for cough which helps.   Using pulmicort everyday no missed doses .      03/02/12 Acute OV  Complains of increased asthma symptoms with w/ wheezing, increased SOB, dry cough, chest congestion "off and on" x2 months.  has been seeing Dr Eileen Stanford w/ abx and pred rx couple  Of days ago. Does feel some bettter but still has cough.  No fever, chest pain or edema.   REC Continue on QVAR 2 puffs Twice daily , brush , rinse and gargle after use  Finish the Medrol dose pack  I will call with xray results.  follow up Dr. Marchelle Gearing in 4 weeks with PFT (lung function test)  Please contact office for sooner follow up if symptoms do not  improve or worsen or seek emergency care    OV 04/11/2012 [name] returns for followup for asthma. She in acute exacerbation 03/02/2012. She was treated with Medrol Dosepak. Since then she is remarkably better. She continues on Qvar 2 puffs 2 times daily diligently without fail per her history. Of note I have not seen her since July 2011 for her asthma. She is not on allergy shots but she sees allergist Dr. Aris Georgia who was ordered cardiology workup for her. She has seen Dr. Marca Ancona for the same and further workup is pending though delayed due to bad weather. At this point she is denying any nocturnal awakenings, albuterol rescue use, chest tightness or wheezing and she feels her asthma is extremely well controlled. PFTs reviewed below and only show mild abnormalities. She is up-to-date with her flu shot. 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  HENT: Negative for ear pain, nosebleeds, congestion, sore throat, rhinorrhea, sneezing, trouble swallowing, dental problem, postnasal drip and sinus pressure.   Eyes: Negative for redness and itching.  Respiratory: Negative for cough, chest tightness, shortness of breath and wheezing.   Cardiovascular: Negative for palpitations and leg swelling.  Gastrointestinal: Negative for nausea and vomiting.  Genitourinary: Negative for dysuria.  Musculoskeletal: Negative for joint swelling.  Skin: Negative for rash.  Neurological: Negative for headaches.  Hematological: Does not bruise/bleed easily.  Psychiatric/Behavioral: Negative for dysphoric mood. The patient is not nervous/anxious.   Current outpatient prescriptions:amLODipine (NORVASC) 5 MG tablet, take 1 tablet by mouth once daily, Disp: 30 tablet, Rfl: 5;  ASTEPRO 0.15 % SOLN, instill  1 to 2 sprays into each nostril once daily, Disp: 30 mL, Rfl: 6;  azelastine (ASTELIN) 137 MCG/SPRAY nasal spray, Place 1 spray into the nose 2 (two) times daily. Use in each nostril as directed , Disp: , Rfl: ;  BYSTOLIC 10 MG tablet, TAKE 1 TABLET BY MOUTH ONCE DAILY, Disp: 30 each, Rfl: 2 CARAFATE 1 GM/10ML suspension, take 2 teaspoonfuls by mouth four times a day, Disp: 420 mL, Rfl: 3;  chlorpheniramine-HYDROcodone (TUSSIONEX PENNKINETIC ER) 10-8 MG/5ML LQCR, Take 5 mLs by mouth every 12 (twelve) hours as needed., Disp: 120 mL, Rfl: 5;  diptheria-tetanus toxoids (DECAVAC) 2-2 LF/0.5ML injection, Inject 0.5 mLs into the muscle once., Disp: 0.5 mL, Rfl: 0 dorzolamide-timolol (COSOPT) 22.3-6.8 MG/ML ophthalmic solution, Place 1 drop into both eyes 3 (three) times daily.  , Disp: , Rfl: ;  fexofenadine (ALLEGRA) 180 MG tablet, Take 180 mg by mouth daily.  , Disp: , Rfl: ;  furosemide (LASIX) 40 MG tablet, TAKE 1 TABLET BY MOUTH ONCE DAILY, Disp: 30 tablet,  Rfl: 5;  levalbuterol (XOPENEX HFA) 45 MCG/ACT inhaler, Inhale 1-2 puffs into the lungs at bedtime., Disp: 1 Inhaler, Rfl: 5 losartan (COZAAR) 100 MG tablet, TAKE 1 TABLET BY MOUTH ONCE DAILY, Disp: 30 tablet, Rfl: 5;  omeprazole (PRILOSEC) 40 MG capsule, Take 1 capsule (40 mg total) by mouth daily., Disp: 30 capsule, Rfl: 11;  pilocarpine (PILOPINE HS) 4 % ophthalmic gel, Place 1 application into the right eye at bedtime.  , Disp: , Rfl: ;  PROAIR HFA 108 (90 BASE) MCG/ACT inhaler, INHALE 2 PUFFS BY MOUTH EVERY 6 HOURS IF NEEDED, Disp: 18 g, Rfl: 2 QVAR 80 MCG/ACT inhaler, Inhale 2 puffs into the lungs 2 (two) times daily., Disp: , Rfl: ;  VOLTAREN 1 % GEL, APPLY 4 GMS TO SORE AREAS 4 TIMES A DAY AS NEEDED, Disp: 100 g, Rfl: 4;  ZETIA 10 MG tablet, take 1 tablet by mouth once daily, Disp: 30 each, Rfl: 5      Objective:   Physical Exam GEN: A/Ox3; pleasant , NAD  HEENT:  [place]/AT,  EACs-clear, TMs-wnl, NOSE-clear, THROAT-clear, no lesions, no postnasal  drip or exudate noted.   NECK:  Supple w/ fair ROM; no JVD; normal carotid impulses w/o bruits; no thyromegaly or nodules palpated; no lymphadenopathy.  RESP  Decreased BS in bases .no accessory muscle use, no dullness to percussion  CARD:  RRR, no m/r/g  , no peripheral edema, pulses intact, no cyanosis or clubbing.  GI:   Soft & nt; nml bowel sounds; no organomegaly or masses detected.  Musco: Warm bil, no deformities or joint swelling noted.   Neuro: alert, no focal deficits noted.    Skin: Warm, no lesions or rashes           Assessment & Plan:

## 2012-04-11 NOTE — Patient Instructions (Addendum)
Glad you are doing well For sinus congestion do hypertonic saline spray 2 squirts each nostril at night. This is available over-the-counter and this made that company called [employer] Continue your asthma medications as before  Please keep note that Qvar is to be used daily 2 puffs twice a day without fail. This is a maintenance medication. Use albuterol as needed Return in 6 months for routine followup Come sooner if there are problems

## 2012-04-13 NOTE — Assessment & Plan Note (Signed)
Glad you are doing well For sinus congestion do hypertonic saline spray 2 squirts each nostril at night. This is available over-the-counter and this made that company called [employer] Continue your asthma medications as before  Please keep note that Qvar is to be used daily 2 puffs twice a day without fail. This is a maintenance medication. Use albuterol as needed Return in 6 months for routine followup Come sooner if there are problems    

## 2012-04-14 ENCOUNTER — Other Ambulatory Visit (HOSPITAL_COMMUNITY): Payer: Medicare Other

## 2012-04-18 ENCOUNTER — Encounter (HOSPITAL_COMMUNITY): Payer: Medicare Other

## 2012-05-02 ENCOUNTER — Encounter: Payer: Self-pay | Admitting: Internal Medicine

## 2012-06-03 ENCOUNTER — Other Ambulatory Visit: Payer: Self-pay | Admitting: Internal Medicine

## 2012-06-09 ENCOUNTER — Other Ambulatory Visit: Payer: Self-pay | Admitting: Internal Medicine

## 2012-07-11 ENCOUNTER — Other Ambulatory Visit: Payer: Self-pay | Admitting: Internal Medicine

## 2012-07-12 ENCOUNTER — Other Ambulatory Visit: Payer: Self-pay | Admitting: Internal Medicine

## 2012-07-28 DIAGNOSIS — H409 Unspecified glaucoma: Secondary | ICD-10-CM | POA: Diagnosis not present

## 2012-07-28 DIAGNOSIS — H4011X0 Primary open-angle glaucoma, stage unspecified: Secondary | ICD-10-CM | POA: Diagnosis not present

## 2012-08-11 DIAGNOSIS — Z1231 Encounter for screening mammogram for malignant neoplasm of breast: Secondary | ICD-10-CM | POA: Diagnosis not present

## 2012-08-22 ENCOUNTER — Encounter: Payer: Self-pay | Admitting: Internal Medicine

## 2012-08-23 ENCOUNTER — Other Ambulatory Visit: Payer: Self-pay | Admitting: Internal Medicine

## 2012-09-05 ENCOUNTER — Encounter: Payer: Self-pay | Admitting: Internal Medicine

## 2012-09-05 ENCOUNTER — Ambulatory Visit (INDEPENDENT_AMBULATORY_CARE_PROVIDER_SITE_OTHER): Payer: Medicare Other | Admitting: Internal Medicine

## 2012-09-05 ENCOUNTER — Other Ambulatory Visit (INDEPENDENT_AMBULATORY_CARE_PROVIDER_SITE_OTHER): Payer: Medicare Other

## 2012-09-05 VITALS — BP 176/80 | HR 57 | Temp 97.3°F | Ht 60.75 in | Wt 173.8 lb

## 2012-09-05 DIAGNOSIS — E785 Hyperlipidemia, unspecified: Secondary | ICD-10-CM

## 2012-09-05 DIAGNOSIS — I10 Essential (primary) hypertension: Secondary | ICD-10-CM

## 2012-09-05 DIAGNOSIS — Z0000 Encounter for general adult medical examination without abnormal findings: Secondary | ICD-10-CM

## 2012-09-05 DIAGNOSIS — K219 Gastro-esophageal reflux disease without esophagitis: Secondary | ICD-10-CM

## 2012-09-05 LAB — LIPID PANEL: Total CHOL/HDL Ratio: 3

## 2012-09-05 LAB — COMPREHENSIVE METABOLIC PANEL
BUN: 9 mg/dL (ref 6–23)
CO2: 31 mEq/L (ref 19–32)
Calcium: 9.4 mg/dL (ref 8.4–10.5)
Chloride: 105 mEq/L (ref 96–112)
Creatinine, Ser: 0.8 mg/dL (ref 0.4–1.2)
GFR: 92.13 mL/min (ref >60.00)

## 2012-09-05 LAB — HEPATIC FUNCTION PANEL
Alkaline Phosphatase: 71 U/L (ref 39–117)
Bilirubin, Direct: 0.1 mg/dL (ref 0.0–0.3)

## 2012-09-05 LAB — TSH: TSH: 1.35 u[IU]/mL (ref 0.35–5.50)

## 2012-09-05 NOTE — Progress Notes (Signed)
Subjective:    Patient ID: [name], female    DOB: 1933/08/16, 79 y.o.   MRN: [id number]  HPI The patient is here for annual Medicare wellness examination and management of other chronic and acute problems. She feels wonderful.   The risk factors are reflected in the social history.  The roster of all physicians providing medical care to patient - is listed in the Snapshot section of the chart.  Activities of daily living:  The patient is 100% inedpendent in all ADLs: dressing, toileting, feeding as well as independent mobility  Home safety : The patient has smoke detectors in the home. Falls - no falls and home is fall safe.They wear seatbelts. No firearms at home. There is no violence in the home.   There is no risks for hepatitis, STDs or HIV. There is no history of blood transfusion. They have no travel history to infectious disease endemic areas of the world.  The patient has not seen their dentist in the last six month. They have seen their eye doctor in the last year. They deny any hearing difficulty and have not had audiologic testing in the last year.    They do not  have excessive sun exposure. Discussed the need for sun protection: hats, long sleeves and use of sunscreen if there is significant sun exposure.   Diet: the importance of a healthy diet is discussed. They do have a healthy diet.  The patient has a regular exercise program.  The benefits of regular aerobic exercise were discussed.  Depression screen: there are no signs or vegative symptoms of depression- irritability, change in appetite, anhedonia, sadness/tearfullness.  Cognitive assessment: the patient manages all their financial and personal affairs and is actively engaged.   The following portions of the patient's history were reviewed and updated as appropriate: allergies, current medications, past family history, past medical history,  past surgical history, past social history  and problem  list.  Vision, hearing, body mass index were assessed and reviewed.   During the course of the visit the patient was educated and counseled about appropriate screening and preventive services including : fall prevention , diabetes screening, nutrition counseling, colorectal cancer screening, and recommended immunizations.  Past Medical History  Diagnosis Date  . Glaucoma   . Allergy, unspecified not elsewhere classified   . Asthma   . HTN (hypertension)   . Hyperlipidemia   . GERD (gastroesophageal reflux disease)   . Diverticulosis of colon (without mention of hemorrhage)   . History of GI diverticular bleed   . DJD (degenerative joint disease)   . Anxiety   . Diverticulitis   . LPRD (laryngopharyngeal reflux disease)   . Cataract   . Gastritis    Past Surgical History  Procedure Laterality Date  . Total abdominal hysterectomy      fibroid tumor  . Bladder repair      after perforation  . Kidney surgery    . Knee arthroscopy  1998    left  . Cataract surgery  2011    w/ IOL Nile Riggs)   Family History  Problem Relation Age of Onset  . Other Mother     bladder tumor  . Colon cancer Neg Hx   . Breast cancer Neg Hx   . Diabetes Neg Hx   . Heart disease Father     ??  . Arthritis Mother    History   Social History  . Marital Status: Widowed    Spouse Name: N/A  Number of Children: 4  . Years of Education: N/A   Occupational History  . 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 Smoking status: Never Smoker   . Smokeless tobacco: Never Used  . Alcohol Use: No  . Drug Use: No  . Sexually Active: Not on file   Other Topics Concern  . Not on file   Social History Narrative   HSG. Married '56 - '87, widowed. 2 sons > '53, '62. 2 daughters > '59, '61. 9 granddaughters. 12 great-grandchildren. Lives inown home and son lives with her. She works 3 days a week for [employer].   ACP - asked but not answered (June '14). Referred to  [email or web address]      Review of Systems Constitutional:  Negative for fever, chills, activity change and unexpected weight change.  HEENT:  Negative for hearing loss, ear pain, congestion, neck stiffness and postnasal drip. Negative for sore throat or swallowing problems. Negative for dental complaints.   Eyes: Negative for vision loss or change in visual acuity.  Respiratory: Negative for chest tightness and wheezing. Negative for DOE.   Cardiovascular: Negative for chest pain or palpitations. No decreased exercise tolerance Gastrointestinal: No change in bowel habit. No bloating or gas. No reflux or indigestion Genitourinary: Negative for urgency, frequency, flank pain and difficulty urinating.  Musculoskeletal: Negative for myalgias, back pain, arthralgias and gait problem.  Neurological: Negative for dizziness, tremors, weakness and headaches.  Hematological: Negative for adenopathy.  Psychiatric/Behavioral: Negative for behavioral problems and dysphoric mood.       Objective:   Physical Exam        Assessment & Plan:

## 2012-09-05 NOTE — Assessment & Plan Note (Signed)
Interval history is negative. She is feeling well and doing well. Limited physical exam is normal. She is current with colorectal and breast cancer screening. Immunizations are up to date except for shingles  - she is to check again on coverage.  In summary  A lovely woman who seems medically stable. She needs a regular exercise program, water aerobics with her friends and follow up on problems in 6 months.

## 2012-09-05 NOTE — Patient Instructions (Addendum)
Thank you for coming to see me.   You are doing great: history is normal and exam is good. Labs are pendings and will be included in the report I send you.  I want you to consider advanced care planning. See the material I give you and have a family member help you on the computer to go to [email or web address] for more information about patient control of their care.  If all goes well come see me in a year, sooner as needed.

## 2012-09-05 NOTE — Assessment & Plan Note (Signed)
Patients reports only occasional breakthrough reflux  Plan continue PPI and sucralfate

## 2012-09-05 NOTE — Assessment & Plan Note (Signed)
BP Readings from Last 3 Encounters:  09/05/12 176/80  04/11/12 122/82  03/28/12 142/88   Too high.   Plan  Continue 4 drug regimen in light of previously good control.  Monitor BP at home and report back readings

## 2012-09-05 NOTE — Assessment & Plan Note (Signed)
Lab today with LDL 101 - OK  Plan Continue zetia.

## 2012-09-12 DIAGNOSIS — J45909 Unspecified asthma, uncomplicated: Secondary | ICD-10-CM | POA: Diagnosis not present

## 2012-09-12 DIAGNOSIS — J3089 Other allergic rhinitis: Secondary | ICD-10-CM | POA: Diagnosis not present

## 2012-09-12 DIAGNOSIS — J301 Allergic rhinitis due to pollen: Secondary | ICD-10-CM | POA: Diagnosis not present

## 2012-09-14 ENCOUNTER — Other Ambulatory Visit: Payer: Self-pay | Admitting: Internal Medicine

## 2012-09-23 ENCOUNTER — Telehealth: Payer: Self-pay | Admitting: Internal Medicine

## 2012-09-23 NOTE — Telephone Encounter (Signed)
Was seen at [facility] allergy  09/12/12 and noted to be doing well on [facility]. Feno was 15  Dr. Kalman Shan, M.D., [facility].C.P Pulmonary and Critical Care Medicine Staff Physician [facility] Pager: [phone], If no answer or between  15:00h - 7:00h: call [phone]  09/23/2012 5:37 AM

## 2012-09-26 ENCOUNTER — Other Ambulatory Visit: Payer: Self-pay | Admitting: Internal Medicine

## 2012-09-28 ENCOUNTER — Other Ambulatory Visit: Payer: Self-pay | Admitting: Internal Medicine

## 2012-10-11 ENCOUNTER — Other Ambulatory Visit: Payer: Self-pay | Admitting: Internal Medicine

## 2012-10-13 ENCOUNTER — Ambulatory Visit: Payer: Medicare Other | Admitting: Internal Medicine

## 2012-10-21 ENCOUNTER — Other Ambulatory Visit: Payer: Self-pay | Admitting: Internal Medicine

## 2012-11-03 ENCOUNTER — Other Ambulatory Visit: Payer: Self-pay | Admitting: Internal Medicine

## 2012-11-28 DIAGNOSIS — H409 Unspecified glaucoma: Secondary | ICD-10-CM | POA: Diagnosis not present

## 2012-11-28 DIAGNOSIS — H4011X0 Primary open-angle glaucoma, stage unspecified: Secondary | ICD-10-CM | POA: Diagnosis not present

## 2012-12-19 ENCOUNTER — Other Ambulatory Visit: Payer: Self-pay | Admitting: Internal Medicine

## 2012-12-19 DIAGNOSIS — Z23 Encounter for immunization: Secondary | ICD-10-CM | POA: Diagnosis not present

## 2012-12-24 ENCOUNTER — Other Ambulatory Visit: Payer: Self-pay | Admitting: Internal Medicine

## 2012-12-26 ENCOUNTER — Telehealth: Payer: Self-pay

## 2012-12-26 NOTE — Telephone Encounter (Signed)
Phone call to patient needing some information on the prior authorization for Voltaren 1% gel

## 2012-12-28 NOTE — Telephone Encounter (Signed)
Received a fax back from patient insurance stating Voltaren 1% gel does not need prior auth that it is covered under her insurance plan. Insurance suggested to fax pharmacy this info and to reprocess the request. This was done.

## 2013-01-13 ENCOUNTER — Other Ambulatory Visit: Payer: Self-pay | Admitting: Internal Medicine

## 2013-02-11 ENCOUNTER — Other Ambulatory Visit: Payer: Self-pay | Admitting: Internal Medicine

## 2013-02-23 ENCOUNTER — Other Ambulatory Visit: Payer: Self-pay | Admitting: Internal Medicine

## 2013-02-27 DIAGNOSIS — Z961 Presence of intraocular lens: Secondary | ICD-10-CM | POA: Diagnosis not present

## 2013-02-27 DIAGNOSIS — H4011X0 Primary open-angle glaucoma, stage unspecified: Secondary | ICD-10-CM | POA: Diagnosis not present

## 2013-02-27 DIAGNOSIS — H409 Unspecified glaucoma: Secondary | ICD-10-CM | POA: Diagnosis not present

## 2013-03-14 ENCOUNTER — Other Ambulatory Visit: Payer: Self-pay | Admitting: Internal Medicine

## 2013-03-14 DIAGNOSIS — N8111 Cystocele, midline: Secondary | ICD-10-CM | POA: Diagnosis not present

## 2013-03-29 ENCOUNTER — Other Ambulatory Visit: Payer: Self-pay | Admitting: Internal Medicine

## 2013-04-05 ENCOUNTER — Other Ambulatory Visit: Payer: Medicare Other

## 2013-04-05 ENCOUNTER — Ambulatory Visit (INDEPENDENT_AMBULATORY_CARE_PROVIDER_SITE_OTHER): Payer: Medicare Other | Admitting: Internal Medicine

## 2013-04-05 ENCOUNTER — Encounter: Payer: Self-pay | Admitting: Internal Medicine

## 2013-04-05 VITALS — BP 160/88 | HR 53 | Temp 97.3°F | Wt 172.8 lb

## 2013-04-05 DIAGNOSIS — I10 Essential (primary) hypertension: Secondary | ICD-10-CM

## 2013-04-05 DIAGNOSIS — E785 Hyperlipidemia, unspecified: Secondary | ICD-10-CM | POA: Diagnosis not present

## 2013-04-05 LAB — BASIC METABOLIC PANEL
BUN: 10 mg/dL (ref 6–23)
CHLORIDE: 106 meq/L (ref 96–112)
CO2: 30 meq/L (ref 19–32)
CREATININE: 0.8 mg/dL (ref 0.4–1.2)
Calcium: 9.4 mg/dL (ref 8.4–10.5)
GFR: 96.25 mL/min (ref >60.00)
GLUCOSE: 92 mg/dL (ref 70–99)
Potassium: 3.9 mEq/L (ref 3.5–5.1)
Sodium: 141 mEq/L (ref 135–145)

## 2013-04-05 NOTE — Progress Notes (Signed)
Pre visit review using our clinic review tool, if applicable. No additional management support is needed unless otherwise documented below in the visit note. 

## 2013-04-05 NOTE — Progress Notes (Signed)
Subjective:    Patient ID: [name], female    DOB: Sep 03, 1933, 80 y.o.   MRN: [id number]  HPI Mrs Grunert presents for medical follow up. She reports that her blood pressure is usually less than 150 when she checks it at home. She is doing well.   PMH, FamHx and SocHx reviewed for any changes and relevance.  Current Outpatient Prescriptions on File Prior to Visit  Medication Sig Dispense Refill  . amLODipine (NORVASC) 5 MG tablet take 1 tablet by mouth once daily  30 tablet  5  . ASTEPRO 0.15 % SOLN instill 1 to 2 sprays into each nostril once daily  30 mL  6  . azelastine (ASTELIN) 137 MCG/SPRAY nasal spray Place 1 spray into the nose 2 (two) times daily. Use in each nostril as directed       . BYSTOLIC 10 MG tablet take 1 tablet by mouth once daily  30 tablet  5  . CARAFATE 1 GM/10ML suspension take 10 milliliters by mouth four times a day  420 mL  1  . chlorpheniramine-HYDROcodone (TUSSIONEX PENNKINETIC ER) 10-8 MG/5ML LQCR Take 5 mLs by mouth every 12 (twelve) hours as needed.  120 mL  5  . dorzolamide-timolol (COSOPT) 22.3-6.8 MG/ML ophthalmic solution Place 1 drop into both eyes 3 (three) times daily.        . fexofenadine (ALLEGRA) 180 MG tablet Take 180 mg by mouth daily.        . furosemide (LASIX) 40 MG tablet take 1 tablet by mouth once daily  30 tablet  5  . losartan (COZAAR) 100 MG tablet take 1 tablet by mouth once daily  30 tablet  5  . omeprazole (PRILOSEC) 40 MG capsule Take 1 capsule (40 mg total) by mouth daily.  30 capsule  11  . PROAIR HFA 108 (90 BASE) MCG/ACT inhaler INHALE 2 PUFFS BY MOUTH EVERY 6 HOURS IF NEEDED  8.5 g  5  . QVAR 80 MCG/ACT inhaler Inhale 2 puffs into the lungs 2 (two) times daily.      . VOLTAREN 1 % GEL APPLY 4 GMS TO SORE AREAS 4 TIMES A DAY AS NEEDED  100 g  4  . XOPENEX HFA 45 MCG/ACT inhaler inhale 1 to 2 puffs at bedtime  15 g  5  . ZETIA 10 MG tablet take 1 tablet by mouth once daily  30 tablet  5   No current  facility-administered medications on file prior to visit.      Review of Systems System review is negative for any constitutional, cardiac, pulmonary, GI or neuro symptoms or complaints other than as described in the HPI.     Objective:   Physical Exam Filed Vitals:   04/05/13 0937  BP: 160/88  Pulse: 53  Temp: 97.3 F (36.3 C)   Wt Readings from Last 3 Encounters:  04/05/13 172 lb 12.8 oz (78.382 kg)  09/05/12 173 lb 12.8 oz (78.835 kg)  04/11/12 175 lb (79.379 kg)   BP Readings from Last 3 Encounters:  04/05/13 160/88  09/05/12 176/80  04/11/12 122/82   Gen'l - WNWD woman in no distress HEENT- normal Cor - RRR PUlm - RRR Neuro - normal.        Assessment & Plan:

## 2013-04-05 NOTE — Patient Instructions (Signed)
It is good to see you and to see you doing so well. We are all thankful to be blessed with good health and family.  Your blood pressure is a little high today: it should be 150 or less on top and 90 or less on the bottom. If it is running too high please let me know.  You are up to date with immunizations and this information will be put in your chart.  You will have routine lab today and the results will be mailed to you (called if there is anything out of line. )

## 2013-04-06 NOTE — Assessment & Plan Note (Signed)
Last LDL 102, HDL 74. Tolerating treatment. Due for repeat lab in July '15

## 2013-04-06 NOTE — Assessment & Plan Note (Signed)
Your blood pressure is a little high today: it should be 150 or less on top and 90 or less on the bottom. If it is running too high please let me know.

## 2013-04-08 ENCOUNTER — Emergency Department (HOSPITAL_COMMUNITY)
Admission: EM | Admit: 2013-04-08 | Discharge: 2013-04-08 | Disposition: A | Discharge status: Home / Self Care | Payer: Medicare Other | Attending: Emergency Medicine | Admitting: Emergency Medicine

## 2013-04-08 ENCOUNTER — Emergency Department (HOSPITAL_COMMUNITY): Payer: Medicare Other

## 2013-04-08 ENCOUNTER — Encounter (HOSPITAL_COMMUNITY): Payer: Self-pay | Admitting: Emergency Medicine

## 2013-04-08 DIAGNOSIS — J45909 Unspecified asthma, uncomplicated: Secondary | ICD-10-CM | POA: Insufficient documentation

## 2013-04-08 DIAGNOSIS — Z8659 Personal history of other mental and behavioral disorders: Secondary | ICD-10-CM | POA: Insufficient documentation

## 2013-04-08 DIAGNOSIS — Z862 Personal history of diseases of the blood and blood-forming organs and certain disorders involving the immune mechanism: Secondary | ICD-10-CM | POA: Diagnosis not present

## 2013-04-08 DIAGNOSIS — IMO0002 Reserved for concepts with insufficient information to code with codable children: Secondary | ICD-10-CM | POA: Diagnosis not present

## 2013-04-08 DIAGNOSIS — I10 Essential (primary) hypertension: Secondary | ICD-10-CM | POA: Insufficient documentation

## 2013-04-08 DIAGNOSIS — R519 Headache, unspecified: Secondary | ICD-10-CM

## 2013-04-08 DIAGNOSIS — Z8739 Personal history of other diseases of the musculoskeletal system and connective tissue: Secondary | ICD-10-CM | POA: Insufficient documentation

## 2013-04-08 DIAGNOSIS — H409 Unspecified glaucoma: Secondary | ICD-10-CM | POA: Diagnosis not present

## 2013-04-08 DIAGNOSIS — R55 Syncope and collapse: Secondary | ICD-10-CM | POA: Insufficient documentation

## 2013-04-08 DIAGNOSIS — Z8639 Personal history of other endocrine, nutritional and metabolic disease: Secondary | ICD-10-CM | POA: Diagnosis not present

## 2013-04-08 DIAGNOSIS — R51 Headache: Secondary | ICD-10-CM | POA: Insufficient documentation

## 2013-04-08 DIAGNOSIS — K219 Gastro-esophageal reflux disease without esophagitis: Secondary | ICD-10-CM | POA: Diagnosis not present

## 2013-04-08 DIAGNOSIS — Z79899 Other long term (current) drug therapy: Secondary | ICD-10-CM | POA: Diagnosis not present

## 2013-04-08 LAB — CBC
HCT: 40 % (ref 36.0–46.0)
Hemoglobin: 13.1 g/dL (ref 12.0–15.0)
MCH: 27.6 pg (ref 26.0–34.0)
MCHC: 32.8 g/dL (ref 30.0–36.0)
MCV: 84.2 fL (ref 78.0–100.0)
Platelets: 304 10*3/uL (ref 150–400)
RBC: 4.75 MIL/uL (ref 3.87–5.11)
RDW: 14 % (ref 11.5–15.5)
WBC: 6.3 10*3/uL (ref 4.0–10.5)

## 2013-04-08 LAB — BASIC METABOLIC PANEL
BUN: 9 mg/dL (ref 6–23)
CHLORIDE: 107 meq/L (ref 96–112)
CO2: 27 meq/L (ref 19–32)
CREATININE: 0.76 mg/dL (ref 0.50–1.10)
Calcium: 9.5 mg/dL (ref 8.4–10.5)
GFR calc Af Amer: >90 mL/min (ref >90)
GFR calc non Af Amer: 79 mL/min — ABNORMAL LOW (ref >90)
Glucose, Bld: 78 mg/dL (ref 70–99)
Potassium: 4 mEq/L (ref 3.7–5.3)
SODIUM: 145 meq/L (ref 137–147)

## 2013-04-08 LAB — POCT I-STAT TROPONIN I: TROPONIN I, POC: 0.01 ng/mL (ref 0.00–0.08)

## 2013-04-08 MED ORDER — ACETAMINOPHEN 325 MG PO TABS
650.0000 mg | ORAL_TABLET | Freq: Once | ORAL | Status: AC
  Administered 2013-04-08: 650 mg via ORAL
  Filled 2013-04-08: qty 2

## 2013-04-08 MED ORDER — DIPHENHYDRAMINE HCL 50 MG/ML IJ SOLN
12.5000 mg | Freq: Once | INTRAMUSCULAR | Status: AC
  Administered 2013-04-08: 12.5 mg via INTRAVENOUS
  Filled 2013-04-08: qty 1

## 2013-04-08 MED ORDER — METOCLOPRAMIDE HCL 5 MG/ML IJ SOLN
10.0000 mg | Freq: Once | INTRAMUSCULAR | Status: AC
  Administered 2013-04-08: 10 mg via INTRAVENOUS
  Filled 2013-04-08: qty 2

## 2013-04-08 NOTE — ED Notes (Signed)
Pt transported to CT ?

## 2013-04-08 NOTE — ED Provider Notes (Signed)
CSN: [id number]     Arrival date & time 04/08/13  1258 History   First MD Initiated Contact with Patient 04/08/13 1310     Chief Complaint  Patient presents with  . Headache  . Near Syncope   (Consider location/radiation/quality/duration/timing/severity/associated sxs/prior Treatment) Patient is a 80 y.o. female presenting with headaches and near-syncope. The history is provided by the patient.  Headache Pain location:  Occipital Quality:  Dull Radiates to:  Does not radiate Severity at highest:  7/10 Onset quality:  Gradual Timing:  Sporadic Progression:  Improving Chronicity:  Recurrent Similar to prior headaches: yes   Context: not activity and not exposure to bright light   Relieved by:  Nothing Worsened by:  Nothing tried Associated symptoms: near-syncope   Associated symptoms: no abdominal pain, no cough, no fever and no vomiting   Near Syncope Associated symptoms include headaches. Pertinent negatives include no abdominal pain and no shortness of breath.    Past Medical History  Diagnosis Date  . Glaucoma   . Allergy, unspecified not elsewhere classified   . Asthma   . HTN (hypertension)   . Hyperlipidemia   . GERD (gastroesophageal reflux disease)   . Diverticulosis of colon (without mention of hemorrhage)   . History of GI diverticular bleed   . DJD (degenerative joint disease)   . Anxiety   . Diverticulitis   . LPRD (laryngopharyngeal reflux disease)   . Cataract   . Gastritis    Past Surgical History  Procedure Laterality Date  . Total abdominal hysterectomy      fibroid tumor  . Bladder repair      after perforation  . Kidney surgery    . Knee arthroscopy  1998    left  . Cataract surgery  2011    w/ IOL Gershon Crane)   Family History  Problem Relation Age of Onset  . Other Mother     bladder tumor  . Colon cancer Neg Hx   . Breast cancer Neg Hx   . Diabetes Neg Hx   . Heart disease Father     ??  . Arthritis Mother    History  Substance  Use Topics  . Smoking status: Never Smoker   . Smokeless tobacco: Never Used  . Alcohol Use: No   OB History   Grav Para Term Preterm Abortions TAB SAB Ect Mult Living                 Review of Systems  Constitutional: Negative for fever and chills.  Respiratory: Negative for cough and shortness of breath.   Cardiovascular: Positive for near-syncope.  Gastrointestinal: Negative for vomiting and abdominal pain.  Neurological: Positive for headaches.  All other systems reviewed and are negative.    Allergies  Budesonide-formoterol fumarate; Codeine; and Guaifenesin  Home Medications   Current Outpatient Rx  Name  Route  Sig  Dispense  Refill  . amLODipine (NORVASC) 5 MG tablet      take 1 tablet by mouth once daily   30 tablet   5   . ASTEPRO 0.15 % SOLN      instill 1 to 2 sprays into each nostril once daily   30 mL   6   . azelastine (ASTELIN) 137 MCG/SPRAY nasal spray   Nasal   Place 1 spray into the nose 2 (two) times daily. Use in each nostril as directed          . BYSTOLIC 10 MG tablet  take 1 tablet by mouth once daily   30 tablet   5   . CARAFATE 1 GM/10ML suspension      take 10 milliliters by mouth four times a day   420 mL   1   . chlorpheniramine-HYDROcodone (TUSSIONEX PENNKINETIC ER) 10-8 MG/5ML LQCR   Oral   Take 5 mLs by mouth every 12 (twelve) hours as needed.   120 mL   5   . dorzolamide-timolol (COSOPT) 22.3-6.8 MG/ML ophthalmic solution   Both Eyes   Place 1 drop into both eyes 3 (three) times daily.           . fexofenadine (ALLEGRA) 180 MG tablet   Oral   Take 180 mg by mouth daily.           . furosemide (LASIX) 40 MG tablet      take 1 tablet by mouth once daily   30 tablet   5   . losartan (COZAAR) 100 MG tablet      take 1 tablet by mouth once daily   30 tablet   5   . omeprazole (PRILOSEC) 40 MG capsule   Oral   Take 1 capsule (40 mg total) by mouth daily.   30 capsule   11   . pilocarpine  (PILOCAR) 4 % ophthalmic solution   Right Eye   Place 1 drop into the right eye at bedtime.         Marland Kitchen PROAIR HFA 108 (90 BASE) MCG/ACT inhaler      INHALE 2 PUFFS BY MOUTH EVERY 6 HOURS IF NEEDED   8.5 g   5   . QVAR 80 MCG/ACT inhaler   Inhalation   Inhale 2 puffs into the lungs 2 (two) times daily.         . VOLTAREN 1 % GEL      APPLY 4 GMS TO SORE AREAS 4 TIMES A DAY AS NEEDED   100 g   4   . XOPENEX HFA 45 MCG/ACT inhaler      inhale 1 to 2 puffs at bedtime   15 g   5   . ZETIA 10 MG tablet      take 1 tablet by mouth once daily   30 tablet   5    BP 167/88  Pulse 87  Temp(Src) 97.4 F (36.3 C) (Oral)  Resp 16  Ht 5' 0.25" (1.53 m)  Wt 173 lb 3 oz (78.557 kg)  BMI 33.56 kg/m2  SpO2 100% Physical Exam  Nursing note and vitals reviewed. Constitutional: She is oriented to person, place, and time. She appears well-developed and well-nourished. No distress.  HENT:  Head: Normocephalic and atraumatic.  Eyes: EOM are normal. Pupils are equal, round, and reactive to light.  Neck: Normal range of motion. Neck supple.  Cardiovascular: Normal rate and regular rhythm.  Exam reveals no friction rub.   No murmur heard. Pulmonary/Chest: Effort normal and breath sounds normal. No respiratory distress. She has no wheezes. She has no rales.  Abdominal: Soft. She exhibits no distension. There is no tenderness. There is no rebound.  Musculoskeletal: Normal range of motion. She exhibits no edema.  Neurological: She is alert and oriented to person, place, and time. No cranial nerve deficit. She exhibits normal muscle tone. Coordination normal.  R eye pupil non-reactive, chronic, due to glaucoma  Skin: No rash noted. She is not diaphoretic.    ED Course  Procedures (including critical care time) Labs Review Labs  Reviewed  CBC  BASIC METABOLIC PANEL   Imaging Review No results found.  EKG Interpretation    Date/Time:  Saturday April 08 2013 13:07:33  EST Ventricular Rate:  63 PR Interval:  178 QRS Duration: 80 QT Interval:  432 QTC Calculation: 442 R Axis:   56 Text Interpretation:  Sinus rhythm with marked sinus arrhythmia Cannot rule out Anterior infarct , age undetermined No previous EKG Confirmed by Mingo Amber  MD, [place] ([id number]) on 04/08/2013 1:19:46 PM            MDM   1. Headache    80 year old female presents with headache and malaise. She had a headache this morning she woke up which gradually worsened throughout the day. Headache is occipital. She has similar headache for a few hours 2 days ago. She also had another headache like this one month ago. With these headaches she's also had mild near syncope. She denies any dizziness, but when she is at rest she just feels like she's going to pass out. She denies any near-syncope with today's headache, only had it with the prior 2 headaches. She denies any blurry vision, nausea, vomiting, chest pain, shortness of breath, belly pain, dysuria. She denies any weakness or numbness in the extremity. She denies a difficulty walking. She took her normal blood pressure and other medications morning. She did not try anything for her headache. Here her vitals are stable with mild hypertension as she has a history of. No weakness or numbness in any extremity. Cranial nerves are normal. His normal coordination. I will scan her head and check basic labs. She is not consistent with a subarachnoid hemorrhage. She is not febrile has no meningeal signs, I am not concerned about meningitis. CT Head is normal. Patient had relief with migraine cocktail. She states she is feeling much better. She can f/u her PCP in 2-3 days. Stable for discharge.   Osvaldo Shipper, MD 04/08/13 [phone]

## 2013-04-08 NOTE — Discharge Instructions (Signed)
Migraine Headache A migraine headache is an intense, throbbing pain on one or both sides of your head. A migraine can last for 30 minutes to several hours. CAUSES  The exact cause of a migraine headache is not always known. However, a migraine may be caused when nerves in the brain become irritated and release chemicals that cause inflammation. This causes pain. Certain things may also trigger migraines, such as:  Alcohol.  Smoking.  Stress.  Menstruation.  Aged cheeses.  Foods or drinks that contain nitrates, glutamate, aspartame, or tyramine.  Lack of sleep.  Chocolate.  Caffeine.  Hunger.  Physical exertion.  Fatigue.  Medicines used to treat chest pain (nitroglycerine), birth control pills, estrogen, and some blood pressure medicines. SIGNS AND SYMPTOMS  Pain on one or both sides of your head.  Pulsating or throbbing pain.  Severe pain that prevents daily activities.  Pain that is aggravated by any physical activity.  Nausea, vomiting, or both.  Dizziness.  Pain with exposure to bright lights, loud noises, or activity.  General sensitivity to bright lights, loud noises, or smells. Before you get a migraine, you may get warning signs that a migraine is coming (aura). An aura may include:  Seeing flashing lights.  Seeing bright spots, halos, or zig-zag lines.  Having tunnel vision or blurred vision.  Having feelings of numbness or tingling.  Having trouble talking.  Having muscle weakness. DIAGNOSIS  A migraine headache is often diagnosed based on:  Symptoms.  Physical exam.  A CT scan or MRI of your head. These imaging tests cannot diagnose migraines, but they can help rule out other causes of headaches. TREATMENT Medicines may be given for pain and nausea. Medicines can also be given to help prevent recurrent migraines.  HOME CARE INSTRUCTIONS  Only take over-the-counter or prescription medicines for pain or discomfort as directed by your  health care provider. The use of long-term narcotics is not recommended.  Lie down in a dark, quiet room when you have a migraine.  Keep a journal to find out what may trigger your migraine headaches. For example, write down:  What you eat and drink.  How much sleep you get.  Any change to your diet or medicines.  Limit alcohol consumption.  Quit smoking if you smoke.  Get 7 9 hours of sleep, or as recommended by your health care provider.  Limit stress.  Keep lights dim if bright lights bother you and make your migraines worse. SEEK IMMEDIATE MEDICAL CARE IF:   Your migraine becomes severe.  You have a fever.  You have a stiff neck.  You have vision loss.  You have muscular weakness or loss of muscle control.  You start losing your balance or have trouble walking.  You feel faint or pass out.  You have severe symptoms that are different from your first symptoms. MAKE SURE YOU:   Understand these instructions.  Will watch your condition.  Will get help right away if you are not doing well or get worse. Document Released: 03/02/2005 Document Revised: 12/21/2012 Document Reviewed: 11/07/2012 [employer] Patient Information 2014 [employer].  
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
## 2013-04-08 NOTE — ED Notes (Signed)
Pt ambulated in hallway without difficulty; steady gait; no lightheadedness or dizziness reported by pt.

## 2013-04-08 NOTE — ED Notes (Signed)
Pt states that since around Christmas she has had intermittent headaches in the back of her head and feeling like she is going to black out.  Pt states she thought maybe it was her allergies bothering her.  Pt states she saw her PCP Wednesday, but didn't mention it.

## 2013-04-09 ENCOUNTER — Encounter: Payer: Self-pay | Admitting: Internal Medicine

## 2013-04-11 ENCOUNTER — Ambulatory Visit (INDEPENDENT_AMBULATORY_CARE_PROVIDER_SITE_OTHER): Payer: Medicare Other | Admitting: Internal Medicine

## 2013-04-11 ENCOUNTER — Encounter: Payer: Self-pay | Admitting: Internal Medicine

## 2013-04-11 VITALS — BP 164/90 | HR 57 | Temp 98.3°F | Wt 172.4 lb

## 2013-04-11 DIAGNOSIS — G44209 Tension-type headache, unspecified, not intractable: Secondary | ICD-10-CM | POA: Diagnosis not present

## 2013-04-11 DIAGNOSIS — R404 Transient alteration of awareness: Secondary | ICD-10-CM | POA: Diagnosis not present

## 2013-04-11 DIAGNOSIS — R4189 Other symptoms and signs involving cognitive functions and awareness: Secondary | ICD-10-CM

## 2013-04-11 MED ORDER — AMLODIPINE BESYLATE 5 MG PO TABS: ORAL_TABLET | ORAL | Status: DC

## 2013-04-11 MED ORDER — NEBIVOLOL HCL 10 MG PO TABS
ORAL_TABLET | ORAL | Status: DC
Start: 2013-04-11 — End: 2013-06-08

## 2013-04-11 MED ORDER — ASPIRIN EC 81 MG PO TBEC: 81.0000 mg | DELAYED_RELEASE_TABLET | Freq: Every day | ORAL | Status: DC
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
## 2013-04-11 NOTE — Progress Notes (Signed)
Pre visit review using our clinic review tool, if applicable. No additional management support is needed unless otherwise documented below in the visit note. 

## 2013-04-11 NOTE — Patient Instructions (Signed)
1. Transient change in level of consciousness - no major neurologic symptoms. Concern for lacunar infarct or vestibular insufficiency.  Plan MRI brain to rule out tiny (lacunar) stroke. If any abnormality seen will get MRA intracranial to rule out vertebral-basilar insufficiency  If above studies negative - will get carotid dopplers to r/o embolic source  ASA 81 mg daily  2. Occipital headache - this sounds like a possible tension headache - a gripping discomfort at the front or back of the skull. This can be stress related.  Plan For recurrent discomfort - aleve every 12 hours as long as there is discomfort.

## 2013-04-19 ENCOUNTER — Other Ambulatory Visit: Payer: Self-pay | Admitting: Internal Medicine

## 2013-04-20 ENCOUNTER — Telehealth: Payer: Self-pay | Admitting: *Deleted

## 2013-04-20 NOTE — Telephone Encounter (Signed)
Patient was calling regarding recently ordered imaging diagnostics.  Advised patient that she would be contacted either by one of our PCC's or the imaging location.

## 2013-05-10 ENCOUNTER — Other Ambulatory Visit: Payer: Self-pay | Admitting: Internal Medicine

## 2013-05-23 ENCOUNTER — Other Ambulatory Visit: Payer: Self-pay | Admitting: Internal Medicine

## 2013-05-29 DIAGNOSIS — H409 Unspecified glaucoma: Secondary | ICD-10-CM | POA: Diagnosis not present

## 2013-05-29 DIAGNOSIS — H4011X0 Primary open-angle glaucoma, stage unspecified: Secondary | ICD-10-CM | POA: Diagnosis not present

## 2013-06-08 ENCOUNTER — Telehealth: Payer: Self-pay

## 2013-06-08 MED ORDER — NEBIVOLOL HCL 10 MG PO TABS: ORAL_TABLET | ORAL | Status: DC

## 2013-06-08 NOTE — Telephone Encounter (Signed)
Patient advised to checked other pharmacy's and to pick up samples in the interim.

## 2013-06-29 DIAGNOSIS — J45909 Unspecified asthma, uncomplicated: Secondary | ICD-10-CM | POA: Diagnosis not present

## 2013-06-29 DIAGNOSIS — J301 Allergic rhinitis due to pollen: Secondary | ICD-10-CM | POA: Diagnosis not present

## 2013-06-29 DIAGNOSIS — J3089 Other allergic rhinitis: Secondary | ICD-10-CM | POA: Diagnosis not present

## 2013-07-20 ENCOUNTER — Other Ambulatory Visit: Payer: Self-pay | Admitting: *Deleted

## 2013-07-20 MED ORDER — SUCRALFATE 1 GM/10ML PO SUSP: 1.0000 g | Freq: Four times a day (QID) | ORAL | Status: DC

## 2013-08-14 ENCOUNTER — Other Ambulatory Visit: Payer: Self-pay | Admitting: *Deleted

## 2013-08-14 MED ORDER — SUCRALFATE 1 GM/10ML PO SUSP: 1.0000 g | Freq: Four times a day (QID) | ORAL | Status: DC

## 2013-08-17 DIAGNOSIS — Z1231 Encounter for screening mammogram for malignant neoplasm of breast: Secondary | ICD-10-CM | POA: Diagnosis not present

## 2013-08-24 DIAGNOSIS — Z1231 Encounter for screening mammogram for malignant neoplasm of breast: Secondary | ICD-10-CM | POA: Diagnosis not present

## 2013-08-24 DIAGNOSIS — R928 Other abnormal and inconclusive findings on diagnostic imaging of breast: Secondary | ICD-10-CM | POA: Diagnosis not present

## 2013-08-24 DIAGNOSIS — R922 Inconclusive mammogram: Secondary | ICD-10-CM | POA: Diagnosis not present

## 2013-08-29 ENCOUNTER — Encounter: Payer: Self-pay | Admitting: Internal Medicine

## 2013-09-10 ENCOUNTER — Other Ambulatory Visit: Payer: Self-pay | Admitting: Internal Medicine

## 2013-09-13 ENCOUNTER — Other Ambulatory Visit: Payer: Self-pay | Admitting: *Deleted

## 2013-09-13 MED ORDER — FUROSEMIDE 40 MG PO TABS: 40.0000 mg | ORAL_TABLET | Freq: Every day | ORAL | Status: DC

## 2013-09-14 DIAGNOSIS — H409 Unspecified glaucoma: Secondary | ICD-10-CM | POA: Diagnosis not present

## 2013-09-14 DIAGNOSIS — IMO0002 Reserved for concepts with insufficient information to code with codable children: Secondary | ICD-10-CM | POA: Diagnosis not present

## 2013-09-14 DIAGNOSIS — Z961 Presence of intraocular lens: Secondary | ICD-10-CM | POA: Diagnosis not present

## 2013-09-14 DIAGNOSIS — H4011X0 Primary open-angle glaucoma, stage unspecified: Secondary | ICD-10-CM | POA: Diagnosis not present

## 2013-09-18 ENCOUNTER — Encounter: Payer: Self-pay | Admitting: Internal Medicine

## 2013-09-19 ENCOUNTER — Other Ambulatory Visit: Payer: Self-pay | Admitting: *Deleted

## 2013-09-19 MED ORDER — EZETIMIBE 10 MG PO TABS: ORAL_TABLET | ORAL | Status: DC

## 2013-09-19 MED ORDER — LOSARTAN POTASSIUM 100 MG PO TABS: 100.0000 mg | ORAL_TABLET | Freq: Every day | ORAL | Status: DC

## 2013-09-19 NOTE — Telephone Encounter (Signed)
Left msg on triage stating pharmacy has been trying to get her zetia refill x's 2 weeks pls call. Called pt back inform her md hasn't received request but we will send refill to [facility]...Johny Chess

## 2013-09-28 NOTE — Telephone Encounter (Signed)
Close encounter 

## 2013-10-19 ENCOUNTER — Telehealth: Payer: Self-pay

## 2013-10-19 NOTE — Telephone Encounter (Signed)
Rx for Carafate was denied. Has not seen new PCP and does not have an appointment scheduled

## 2013-10-20 ENCOUNTER — Telehealth: Payer: Self-pay | Admitting: Internal Medicine

## 2013-10-20 MED ORDER — SUCRALFATE 1 GM/10ML PO SUSP: 1.0000 g | Freq: Two times a day (BID) | ORAL | Status: DC

## 2013-10-20 NOTE — Telephone Encounter (Signed)
Pt schedule for 10/26/13 with Dr. Camila Li, she was wondering if Dr. Camila Li will give her Carafate until she come for the appt. Please call pt. [facility].

## 2013-10-20 NOTE — Telephone Encounter (Signed)
Done. Pt informed.

## 2013-10-25 ENCOUNTER — Telehealth: Payer: Self-pay | Admitting: Internal Medicine

## 2013-10-25 MED ORDER — SUCRALFATE 1 GM/10ML PO SUSP: 1.0000 g | Freq: Two times a day (BID) | ORAL | Status: DC

## 2013-10-25 NOTE — Telephone Encounter (Signed)
Notified pt rx Resent rx to [facility]...lmb

## 2013-10-25 NOTE — Telephone Encounter (Signed)
Patient states that she called [facility] on [address] but they did not have her sucralfate (CARAFATE) rx that was faxed last week. Patient asks that we re-send to [facility] Please advise.

## 2013-10-26 ENCOUNTER — Other Ambulatory Visit (INDEPENDENT_AMBULATORY_CARE_PROVIDER_SITE_OTHER): Payer: Medicare Other

## 2013-10-26 ENCOUNTER — Ambulatory Visit (INDEPENDENT_AMBULATORY_CARE_PROVIDER_SITE_OTHER): Payer: Medicare Other | Admitting: Internal Medicine

## 2013-10-26 ENCOUNTER — Encounter: Payer: Self-pay | Admitting: Internal Medicine

## 2013-10-26 VITALS — BP 170/80 | HR 76 | Temp 98.5°F | Resp 16 | Wt 174.0 lb

## 2013-10-26 DIAGNOSIS — I10 Essential (primary) hypertension: Secondary | ICD-10-CM | POA: Diagnosis not present

## 2013-10-26 DIAGNOSIS — R1900 Intra-abdominal and pelvic swelling, mass and lump, unspecified site: Secondary | ICD-10-CM

## 2013-10-26 LAB — BASIC METABOLIC PANEL
BUN: 9 mg/dL (ref 6–23)
CHLORIDE: 107 meq/L (ref 96–112)
CO2: 30 meq/L (ref 19–32)
Calcium: 9.4 mg/dL (ref 8.4–10.5)
Creatinine, Ser: 0.8 mg/dL (ref 0.4–1.2)
GFR: 93.23 mL/min (ref >60.00)
GLUCOSE: 86 mg/dL (ref 70–99)
Potassium: 3.9 mEq/L (ref 3.5–5.1)
Sodium: 142 mEq/L (ref 135–145)

## 2013-10-26 LAB — CBC WITH DIFFERENTIAL/PLATELET
BASOS PCT: 0.4 % (ref 0.0–3.0)
Basophils Absolute: 0 10*3/uL (ref 0.0–0.1)
Eosinophils Absolute: 0.3 10*3/uL (ref 0.0–0.7)
Eosinophils Relative: 3.7 % (ref 0.0–5.0)
HCT: 40.3 % (ref 36.0–46.0)
HEMOGLOBIN: 13 g/dL (ref 12.0–15.0)
LYMPHS PCT: 26 % (ref 12.0–46.0)
Lymphs Abs: 1.9 10*3/uL (ref 0.7–4.0)
MCHC: 32.3 g/dL (ref 30.0–36.0)
MCV: 86 fl (ref 78.0–100.0)
Monocytes Absolute: 0.5 10*3/uL (ref 0.1–1.0)
Monocytes Relative: 7.4 % (ref 3.0–12.0)
Neutro Abs: 4.6 10*3/uL (ref 1.4–7.7)
Neutrophils Relative %: 62.5 % (ref 43.0–77.0)
Platelets: 272 10*3/uL (ref 150.0–400.0)
RBC: 4.68 Mil/uL (ref 3.87–5.11)
RDW: 14.5 % (ref 11.5–15.5)
WBC: 7.4 10*3/uL (ref 4.0–10.5)

## 2013-10-26 LAB — HEPATIC FUNCTION PANEL
ALT: 12 U/L (ref 0–35)
AST: 20 U/L (ref 0–37)
Albumin: 3.8 g/dL (ref 3.5–5.2)
Alkaline Phosphatase: 78 U/L (ref 39–117)
BILIRUBIN DIRECT: 0.1 mg/dL (ref 0.0–0.3)
TOTAL PROTEIN: 7.2 g/dL (ref 6.0–8.3)
Total Bilirubin: 0.5 mg/dL (ref 0.2–1.2)

## 2013-10-26 MED ORDER — NEBIVOLOL HCL 20 MG PO TABS: 20.0000 mg | ORAL_TABLET | Freq: Every day | ORAL | Status: DC

## 2013-10-26 NOTE — Assessment & Plan Note (Signed)
BP Readings from Last 3 Encounters:  10/26/13 170/80  04/11/13 164/90  04/08/13 144/68   Will increase Bystolic to 20 mg/d

## 2013-10-26 NOTE — Progress Notes (Signed)
Subjective:    Patient ID: [name], female    DOB: 09/05/1933, 80 y.o.   MRN: [id number]  HPI  C/o HTN C/o her stomach sticking out - upper 1/2 - "it is sticking out"; no pain, no n/v  BP Readings from Last 3 Encounters:  10/26/13 170/80  04/11/13 164/90  04/08/13 144/68   Wt Readings from Last 3 Encounters:  10/26/13 174 lb (78.926 kg)  04/11/13 172 lb 6.4 oz (78.2 kg)  04/08/13 173 lb 3 oz (78.557 kg)       Review of Systems  Constitutional: Negative for chills, activity change, appetite change, fatigue and unexpected weight change.  HENT: Negative for congestion, mouth sores and sinus pressure.   Eyes: Negative for visual disturbance.  Respiratory: Negative for cough, chest tightness and shortness of breath.   Cardiovascular: Negative for leg swelling.  Gastrointestinal: Positive for abdominal distention. Negative for nausea, vomiting, abdominal pain, constipation, blood in stool and anal bleeding.  Genitourinary: Negative for frequency, difficulty urinating and vaginal pain.  Musculoskeletal: Negative for back pain, gait problem and neck pain.  Skin: Negative for pallor and rash.  Neurological: Negative for dizziness, tremors, weakness, numbness and headaches.  Psychiatric/Behavioral: Negative for suicidal ideas, confusion and sleep disturbance. The patient is not nervous/anxious.        Objective:   Physical Exam  Constitutional: She appears well-developed. No distress.  HENT:  Head: Normocephalic.  Right Ear: External ear normal.  Left Ear: External ear normal.  Nose: Nose normal.  Mouth/Throat: Oropharynx is clear and moist.  Eyes: Conjunctivae are normal. Pupils are equal, round, and reactive to light. Right eye exhibits no discharge. Left eye exhibits no discharge.  Neck: Normal range of motion. Neck supple. No JVD present. No tracheal deviation present. No thyromegaly present.  Cardiovascular: Normal rate, regular rhythm and normal heart sounds.     Pulmonary/Chest: No stridor. No respiratory distress. She has no wheezes.  Abdominal: Soft. Bowel sounds are normal. She exhibits no distension and no mass. There is no tenderness. There is no rebound and no guarding.  No palpable mass or hernia  Musculoskeletal: She exhibits no edema and no tenderness.  Lymphadenopathy:    She has no cervical adenopathy.  Neurological: She displays normal reflexes. No cranial nerve deficit. She exhibits normal muscle tone. Coordination normal.  Skin: No rash noted. No erythema.  Psychiatric: She has a normal mood and affect. Her behavior is normal. Judgment and thought content normal.    Lab Results  Component Value Date   WBC 6.3 04/08/2013   HGB 13.1 04/08/2013   HCT 40.0 04/08/2013   PLT 304 04/08/2013   GLUCOSE 78 04/08/2013   CHOL 180 09/05/2012   TRIG 66.0 09/05/2012   HDL 64.70 09/05/2012   LDLCALC 102* 09/05/2012   ALT 13 09/05/2012   ALT 13 09/05/2012   AST 17 09/05/2012   AST 17 09/05/2012   NA 145 04/08/2013   K 4.0 04/08/2013   CL 107 04/08/2013   CREATININE 0.76 04/08/2013   BUN 9 04/08/2013   CO2 27 04/08/2013   TSH 1.35 09/05/2012         Assessment & Plan:

## 2013-10-26 NOTE — Assessment & Plan Note (Signed)
8/15 new - epigastric mass [place] abd CBC, CMET

## 2013-10-26 NOTE — Progress Notes (Signed)
Pre visit review using our clinic review tool, if applicable. No additional management support is needed unless otherwise documented below in the visit note. 

## 2013-10-27 ENCOUNTER — Telehealth: Payer: Self-pay | Admitting: Internal Medicine

## 2013-10-27 NOTE — Telephone Encounter (Signed)
Pt request phone call to clarify direction on Carafate and she stated she used to get big bottle and take more. Please advise.

## 2013-10-30 MED ORDER — SUCRALFATE 1 GM/10ML PO SUSP: 1.0000 g | Freq: Two times a day (BID) | ORAL | Status: DC

## 2013-10-30 NOTE — Telephone Encounter (Signed)
Left mess for patient to call back.  

## 2013-10-30 NOTE — Telephone Encounter (Signed)
Pt calling requesting larger quantity of Carafate. New Rx sent. Pt informed

## 2013-11-02 ENCOUNTER — Ambulatory Visit
Admission: RE | Admit: 2013-11-02 | Discharge: 2013-11-02 | Disposition: A | Discharge status: Home / Self Care | Payer: Medicare Other | Source: Ambulatory Visit | Attending: Internal Medicine | Admitting: Internal Medicine

## 2013-11-02 DIAGNOSIS — R141 Gas pain: Secondary | ICD-10-CM | POA: Diagnosis not present

## 2013-11-02 DIAGNOSIS — R109 Unspecified abdominal pain: Secondary | ICD-10-CM | POA: Diagnosis not present

## 2013-11-09 ENCOUNTER — Telehealth: Payer: Self-pay | Admitting: Geriatric Medicine

## 2013-11-09 NOTE — Telephone Encounter (Signed)
Left message for patient to call me back. Abd ultrasound is normal.

## 2013-11-09 NOTE — Telephone Encounter (Signed)
Message copied by Alger Memos on Thu Nov 09, 2013  3:25 PM ------      Message from: Cassandria Anger      Created: Thu Nov 02, 2013  5:40 PM       Erline Levine, please, inform patient that her [place] was ok - no abdominal mass      Thx       ------

## 2013-12-13 DIAGNOSIS — Z23 Encounter for immunization: Secondary | ICD-10-CM | POA: Diagnosis not present

## 2013-12-25 DIAGNOSIS — H4011X2 Primary open-angle glaucoma, moderate stage: Secondary | ICD-10-CM | POA: Diagnosis not present

## 2014-01-29 ENCOUNTER — Ambulatory Visit (INDEPENDENT_AMBULATORY_CARE_PROVIDER_SITE_OTHER): Payer: Medicare Other | Admitting: Internal Medicine

## 2014-01-29 ENCOUNTER — Encounter: Payer: Self-pay | Admitting: Internal Medicine

## 2014-01-29 VITALS — BP 128/78 | HR 56 | Temp 98.2°F | Ht 60.25 in | Wt 173.0 lb

## 2014-01-29 DIAGNOSIS — R1900 Intra-abdominal and pelvic swelling, mass and lump, unspecified site: Secondary | ICD-10-CM | POA: Diagnosis not present

## 2014-01-29 DIAGNOSIS — I10 Essential (primary) hypertension: Secondary | ICD-10-CM

## 2014-01-29 DIAGNOSIS — J4520 Mild intermittent asthma, uncomplicated: Secondary | ICD-10-CM

## 2014-01-29 MED ORDER — HYDROCODONE-HOMATROPINE 5-1.5 MG/5ML PO SYRP: 5.0000 mL | ORAL_SOLUTION | Freq: Four times a day (QID) | ORAL | Status: DC | PRN

## 2014-01-29 NOTE — Assessment & Plan Note (Signed)
Continue with current prescription therapy as reflected on the Med list. Hycodan prn

## 2014-01-29 NOTE — Assessment & Plan Note (Signed)
Continue with current prescription therapy as reflected on the Med list.  

## 2014-01-29 NOTE — Assessment & Plan Note (Signed)
abd [place] was nl, labs nl discussed

## 2014-01-29 NOTE — Progress Notes (Signed)
   Subjective:    HPI  F/u HTN F/u her stomach sticking out - upper 1/2 - "it is sticking out"; no pain, no n/v - [place] and labs were ok C/o occ cough w/asthma  BP Readings from Last 3 Encounters:  01/29/14 128/78  10/26/13 170/80  04/11/13 164/90   Wt Readings from Last 3 Encounters:  01/29/14 173 lb (78.472 kg)  10/26/13 174 lb (78.926 kg)  04/11/13 172 lb 6.4 oz (78.2 kg)       Review of Systems  Constitutional: Negative for chills, activity change, appetite change, fatigue and unexpected weight change.  HENT: Negative for congestion, mouth sores and sinus pressure.   Eyes: Negative for visual disturbance.  Respiratory: Negative for cough, chest tightness and shortness of breath.   Cardiovascular: Negative for leg swelling.  Gastrointestinal: Positive for abdominal distention. Negative for nausea, vomiting, abdominal pain, constipation, blood in stool and anal bleeding.  Genitourinary: Negative for frequency, difficulty urinating and vaginal pain.  Musculoskeletal: Negative for back pain, gait problem and neck pain.  Skin: Negative for pallor and rash.  Neurological: Negative for dizziness, tremors, weakness, numbness and headaches.  Psychiatric/Behavioral: Negative for suicidal ideas, confusion and sleep disturbance. The patient is not nervous/anxious.        Objective:   Physical Exam  Constitutional: She appears well-developed. No distress.  HENT:  Head: Normocephalic.  Right Ear: External ear normal.  Left Ear: External ear normal.  Nose: Nose normal.  Mouth/Throat: Oropharynx is clear and moist.  Eyes: Conjunctivae are normal. Pupils are equal, round, and reactive to light. Right eye exhibits no discharge. Left eye exhibits no discharge.  Neck: Normal range of motion. Neck supple. No JVD present. No tracheal deviation present. No thyromegaly present.  Cardiovascular: Normal rate, regular rhythm and normal heart sounds.   Pulmonary/Chest: No stridor. No  respiratory distress. She has no wheezes.  Abdominal: Soft. Bowel sounds are normal. She exhibits no distension and no mass. There is no tenderness. There is no rebound and no guarding.  Musculoskeletal: She exhibits no edema or tenderness.  Lymphadenopathy:    She has no cervical adenopathy.  Neurological: She displays normal reflexes. No cranial nerve deficit. She exhibits normal muscle tone. Coordination normal.  Skin: No rash noted. No erythema.  Psychiatric: She has a normal mood and affect. Her behavior is normal. Judgment and thought content normal.    Lab Results  Component Value Date   WBC 7.4 10/26/2013   HGB 13.0 10/26/2013   HCT 40.3 10/26/2013   PLT 272.0 10/26/2013   GLUCOSE 86 10/26/2013   CHOL 180 09/05/2012   TRIG 66.0 09/05/2012   HDL 64.70 09/05/2012   LDLCALC 102* 09/05/2012   ALT 12 10/26/2013   AST 20 10/26/2013   NA 142 10/26/2013   K 3.9 10/26/2013   CL 107 10/26/2013   CREATININE 0.8 10/26/2013   BUN 9 10/26/2013   CO2 30 10/26/2013   TSH 1.35 09/05/2012         Assessment & Plan:

## 2014-01-29 NOTE — Progress Notes (Signed)
Pre visit review using our clinic review tool, if applicable. No additional management support is needed unless otherwise documented below in the visit note. 

## 2014-01-30 ENCOUNTER — Telehealth: Payer: Self-pay | Admitting: Internal Medicine

## 2014-01-30 NOTE — Telephone Encounter (Signed)
emmi mailed  °

## 2014-02-27 ENCOUNTER — Other Ambulatory Visit: Payer: Self-pay | Admitting: Internal Medicine

## 2014-03-02 ENCOUNTER — Ambulatory Visit (INDEPENDENT_AMBULATORY_CARE_PROVIDER_SITE_OTHER): Payer: Medicare Other | Admitting: Internal Medicine

## 2014-03-02 ENCOUNTER — Encounter: Payer: Self-pay | Admitting: Internal Medicine

## 2014-03-02 VITALS — BP 130/85 | HR 60 | Temp 98.5°F | Wt 172.0 lb

## 2014-03-02 DIAGNOSIS — J0100 Acute maxillary sinusitis, unspecified: Secondary | ICD-10-CM

## 2014-03-02 DIAGNOSIS — J019 Acute sinusitis, unspecified: Secondary | ICD-10-CM | POA: Insufficient documentation

## 2014-03-02 MED ORDER — AZITHROMYCIN 250 MG PO TABS: ORAL_TABLET | ORAL | Status: DC

## 2014-03-02 NOTE — Progress Notes (Signed)
Subjective:    Sinusitis This is a new problem. The current episode started in the past 7 days. The problem has been gradually worsening since onset. There has been no fever. The pain is mild. Associated symptoms include congestion, coughing and sinus pressure. Pertinent negatives include no chills, headaches, neck pain or shortness of breath.     C/o occ cough w/asthma  BP Readings from Last 3 Encounters:  03/02/14 130/85  01/29/14 128/78  10/26/13 170/80   Wt Readings from Last 3 Encounters:  03/02/14 172 lb (78.019 kg)  01/29/14 173 lb (78.472 kg)  10/26/13 174 lb (78.926 kg)       Review of Systems  Constitutional: Negative for chills, activity change, appetite change, fatigue and unexpected weight change.  HENT: Positive for congestion and sinus pressure. Negative for mouth sores.   Eyes: Negative for visual disturbance.  Respiratory: Positive for cough. Negative for chest tightness and shortness of breath.   Cardiovascular: Negative for leg swelling.  Gastrointestinal: Positive for abdominal distention. Negative for nausea, vomiting, abdominal pain, constipation, blood in stool and anal bleeding.  Genitourinary: Negative for frequency, difficulty urinating and vaginal pain.  Musculoskeletal: Negative for back pain, gait problem and neck pain.  Skin: Negative for pallor and rash.  Neurological: Negative for dizziness, tremors, weakness, numbness and headaches.  Psychiatric/Behavioral: Negative for suicidal ideas, confusion and sleep disturbance. The patient is not nervous/anxious.        Objective:   Physical Exam  Constitutional: She appears well-developed. No distress.  HENT:  Head: Normocephalic.  Right Ear: External ear normal.  Left Ear: External ear normal.  Nose: Nose normal.  Mouth/Throat: Oropharynx is clear and moist.  Eyes: Conjunctivae are normal. Pupils are equal, round, and reactive to light. Right eye exhibits no discharge. Left eye exhibits no  discharge.  Neck: Normal range of motion. Neck supple. No JVD present. No tracheal deviation present. No thyromegaly present.  Cardiovascular: Normal rate, regular rhythm and normal heart sounds.   Pulmonary/Chest: No stridor. No respiratory distress. She has no wheezes.  Abdominal: Soft. Bowel sounds are normal. She exhibits no distension and no mass. There is no tenderness. There is no rebound and no guarding.  Musculoskeletal: She exhibits no edema or tenderness.  Lymphadenopathy:    She has no cervical adenopathy.  Neurological: She displays normal reflexes. No cranial nerve deficit. She exhibits normal muscle tone. Coordination normal.  Skin: No rash noted. No erythema.  Psychiatric: She has a normal mood and affect. Her behavior is normal. Judgment and thought content normal.  swollen nasal mucosa  Lab Results  Component Value Date   WBC 7.4 10/26/2013   HGB 13.0 10/26/2013   HCT 40.3 10/26/2013   PLT 272.0 10/26/2013   GLUCOSE 86 10/26/2013   CHOL 180 09/05/2012   TRIG 66.0 09/05/2012   HDL 64.70 09/05/2012   LDLCALC 102* 09/05/2012   ALT 12 10/26/2013   AST 20 10/26/2013   NA 142 10/26/2013   K 3.9 10/26/2013   CL 107 10/26/2013   CREATININE 0.8 10/26/2013   BUN 9 10/26/2013   CO2 30 10/26/2013   TSH 1.35 09/05/2012         Assessment & Plan:  Patient ID: [name], female   DOB: January 15, 1934, 80 y.o.   MRN: [id number]

## 2014-03-02 NOTE — Progress Notes (Signed)
Pre visit review using our clinic review tool, if applicable. No additional management support is needed unless otherwise documented below in the visit note. 

## 2014-03-02 NOTE — Assessment & Plan Note (Signed)
Zpac 

## 2014-03-05 DIAGNOSIS — N63 Unspecified lump in breast: Secondary | ICD-10-CM | POA: Diagnosis not present

## 2014-03-05 DIAGNOSIS — Z09 Encounter for follow-up examination after completed treatment for conditions other than malignant neoplasm: Secondary | ICD-10-CM | POA: Diagnosis not present

## 2014-03-19 ENCOUNTER — Other Ambulatory Visit: Payer: Self-pay | Admitting: Internal Medicine

## 2014-03-19 DIAGNOSIS — H4011X2 Primary open-angle glaucoma, moderate stage: Secondary | ICD-10-CM | POA: Diagnosis not present

## 2014-03-24 ENCOUNTER — Other Ambulatory Visit: Payer: Self-pay | Admitting: Internal Medicine

## 2014-03-26 ENCOUNTER — Other Ambulatory Visit: Payer: Self-pay | Admitting: Internal Medicine

## 2014-03-28 ENCOUNTER — Encounter: Payer: Self-pay | Admitting: Internal Medicine

## 2014-04-10 ENCOUNTER — Other Ambulatory Visit: Payer: Self-pay | Admitting: *Deleted

## 2014-04-10 MED ORDER — ALBUTEROL SULFATE HFA 108 (90 BASE) MCG/ACT IN AERS: 2.0000 | INHALATION_SPRAY | Freq: Four times a day (QID) | RESPIRATORY_TRACT | Status: DC | PRN

## 2014-04-11 ENCOUNTER — Other Ambulatory Visit: Payer: Self-pay | Admitting: *Deleted

## 2014-04-11 MED ORDER — AMLODIPINE BESYLATE 5 MG PO TABS: ORAL_TABLET | ORAL | Status: DC

## 2014-05-21 DIAGNOSIS — H4011X2 Primary open-angle glaucoma, moderate stage: Secondary | ICD-10-CM | POA: Diagnosis not present

## 2014-05-30 ENCOUNTER — Ambulatory Visit (INDEPENDENT_AMBULATORY_CARE_PROVIDER_SITE_OTHER): Payer: Medicare Other | Admitting: Internal Medicine

## 2014-05-30 ENCOUNTER — Other Ambulatory Visit (INDEPENDENT_AMBULATORY_CARE_PROVIDER_SITE_OTHER): Payer: Medicare Other

## 2014-05-30 ENCOUNTER — Other Ambulatory Visit: Payer: Self-pay | Admitting: *Deleted

## 2014-05-30 ENCOUNTER — Encounter: Payer: Self-pay | Admitting: Internal Medicine

## 2014-05-30 VITALS — BP 125/85 | HR 54 | Wt 174.0 lb

## 2014-05-30 DIAGNOSIS — J4520 Mild intermittent asthma, uncomplicated: Secondary | ICD-10-CM

## 2014-05-30 DIAGNOSIS — Z23 Encounter for immunization: Secondary | ICD-10-CM

## 2014-05-30 DIAGNOSIS — I10 Essential (primary) hypertension: Secondary | ICD-10-CM | POA: Diagnosis not present

## 2014-05-30 DIAGNOSIS — F411 Generalized anxiety disorder: Secondary | ICD-10-CM

## 2014-05-30 DIAGNOSIS — E785 Hyperlipidemia, unspecified: Secondary | ICD-10-CM | POA: Diagnosis not present

## 2014-05-30 LAB — HEPATIC FUNCTION PANEL
ALT: 11 U/L (ref 0–35)
AST: 17 U/L (ref 0–37)
Albumin: 3.9 g/dL (ref 3.5–5.2)
Alkaline Phosphatase: 84 U/L (ref 39–117)
BILIRUBIN DIRECT: 0.1 mg/dL (ref 0.0–0.3)
Total Bilirubin: 0.5 mg/dL (ref 0.2–1.2)
Total Protein: 6.9 g/dL (ref 6.0–8.3)

## 2014-05-30 LAB — LIPID PANEL
CHOLESTEROL: 190 mg/dL (ref 0–200)
HDL: 57.5 mg/dL (ref >39.00)
LDL CALC: 111 mg/dL — AB (ref 0–99)
NonHDL: 132.5
TRIGLYCERIDES: 108 mg/dL (ref 0.0–149.0)
Total CHOL/HDL Ratio: 3
VLDL: 21.6 mg/dL (ref 0.0–40.0)

## 2014-05-30 LAB — BASIC METABOLIC PANEL
BUN: 9 mg/dL (ref 6–23)
CO2: 33 mEq/L — ABNORMAL HIGH (ref 19–32)
Calcium: 9.4 mg/dL (ref 8.4–10.5)
Chloride: 105 mEq/L (ref 96–112)
Creatinine, Ser: 0.81 mg/dL (ref 0.40–1.20)
GFR: 87.81 mL/min (ref >60.00)
Glucose, Bld: 92 mg/dL (ref 70–99)
POTASSIUM: 3.8 meq/L (ref 3.5–5.1)
SODIUM: 142 meq/L (ref 135–145)

## 2014-05-30 LAB — TSH: TSH: 1.63 u[IU]/mL (ref 0.35–4.50)

## 2014-05-30 MED ORDER — AZELASTINE HCL 0.15 % NA SOLN: 1.0000 | Freq: Every day | NASAL | Status: DC

## 2014-05-30 NOTE — Assessment & Plan Note (Signed)
Xopenex prn, Qvar

## 2014-05-30 NOTE — Progress Notes (Signed)
Pre visit review using our clinic review tool, if applicable. No additional management support is needed unless otherwise documented below in the visit note. 

## 2014-05-30 NOTE — Assessment & Plan Note (Signed)
On Zetia 

## 2014-05-30 NOTE — Assessment & Plan Note (Signed)
Cont medications - Losartan, Bystolic, Furosemide, Amlodipoine

## 2014-05-30 NOTE — Progress Notes (Signed)
   Subjective:    HPI  F/u HTN F/u her stomach sticking out - upper 1/2 - "it is sticking out"; no pain, no n/v - [place] and labs were ok F/u occ cough w/asthma - resolved  BP Readings from Last 3 Encounters:  05/30/14 125/85  03/02/14 130/85  01/29/14 128/78   Wt Readings from Last 3 Encounters:  05/30/14 174 lb (78.926 kg)  03/02/14 172 lb (78.019 kg)  01/29/14 173 lb (78.472 kg)       Review of Systems  Constitutional: Negative for chills, activity change, appetite change, fatigue and unexpected weight change.  HENT: Negative for congestion, mouth sores and sinus pressure.   Eyes: Negative for visual disturbance.  Respiratory: Negative for cough, chest tightness and shortness of breath.   Cardiovascular: Negative for leg swelling.  Gastrointestinal: Positive for abdominal distention. Negative for nausea, vomiting, abdominal pain, constipation, blood in stool and anal bleeding.  Genitourinary: Negative for frequency, difficulty urinating and vaginal pain.  Musculoskeletal: Negative for back pain, gait problem and neck pain.  Skin: Negative for pallor and rash.  Neurological: Negative for dizziness, tremors, weakness, numbness and headaches.  Psychiatric/Behavioral: Negative for suicidal ideas, confusion and sleep disturbance. The patient is not nervous/anxious.        Objective:   Physical Exam  Constitutional: She appears well-developed. No distress.  HENT:  Head: Normocephalic.  Right Ear: External ear normal.  Left Ear: External ear normal.  Nose: Nose normal.  Mouth/Throat: Oropharynx is clear and moist.  Eyes: Conjunctivae are normal. Pupils are equal, round, and reactive to light. Right eye exhibits no discharge. Left eye exhibits no discharge.  Neck: Normal range of motion. Neck supple. No JVD present. No tracheal deviation present. No thyromegaly present.  Cardiovascular: Normal rate, regular rhythm and normal heart sounds.   Pulmonary/Chest: No stridor. No  respiratory distress. She has no wheezes.  Abdominal: Soft. Bowel sounds are normal. She exhibits no distension and no mass. There is no tenderness. There is no rebound and no guarding.  Musculoskeletal: She exhibits no edema or tenderness.  Lymphadenopathy:    She has no cervical adenopathy.  Neurological: She displays normal reflexes. No cranial nerve deficit. She exhibits normal muscle tone. Coordination normal.  Skin: No rash noted. No erythema.  Psychiatric: She has a normal mood and affect. Her behavior is normal. Judgment and thought content normal.    Lab Results  Component Value Date   WBC 7.4 10/26/2013   HGB 13.0 10/26/2013   HCT 40.3 10/26/2013   PLT 272.0 10/26/2013   GLUCOSE 86 10/26/2013   CHOL 180 09/05/2012   TRIG 66.0 09/05/2012   HDL 64.70 09/05/2012   LDLCALC 102* 09/05/2012   ALT 12 10/26/2013   AST 20 10/26/2013   NA 142 10/26/2013   K 3.9 10/26/2013   CL 107 10/26/2013   CREATININE 0.8 10/26/2013   BUN 9 10/26/2013   CO2 30 10/26/2013   TSH 1.35 09/05/2012         Assessment & Plan:

## 2014-05-30 NOTE — Assessment & Plan Note (Signed)
Doing well 

## 2014-06-21 ENCOUNTER — Other Ambulatory Visit: Payer: Self-pay | Admitting: *Deleted

## 2014-06-21 MED ORDER — SUCRALFATE 1 GM/10ML PO SUSP: ORAL | Status: DC

## 2014-06-27 ENCOUNTER — Telehealth: Payer: Self-pay | Admitting: Internal Medicine

## 2014-06-27 DIAGNOSIS — J3089 Other allergic rhinitis: Secondary | ICD-10-CM | POA: Diagnosis not present

## 2014-06-27 DIAGNOSIS — J4530 Mild persistent asthma, uncomplicated: Secondary | ICD-10-CM | POA: Diagnosis not present

## 2014-06-27 DIAGNOSIS — J301 Allergic rhinitis due to pollen: Secondary | ICD-10-CM | POA: Diagnosis not present

## 2014-06-27 NOTE — Telephone Encounter (Signed)
Patient needs prescription for omeprazole (PRILOSEC) 40 MG capsule [[id number]. Pharmacy is [facility] on [address]. She says the pharmacy has faxed this 2 or 3 times last week

## 2014-06-28 MED ORDER — OMEPRAZOLE 40 MG PO CPDR: 40.0000 mg | DELAYED_RELEASE_CAPSULE | Freq: Every day | ORAL | Status: DC

## 2014-06-28 NOTE — Telephone Encounter (Signed)
Rf sent. See meds. Left detailed mess informing pt.

## 2014-08-07 ENCOUNTER — Other Ambulatory Visit: Payer: Self-pay | Admitting: *Deleted

## 2014-08-07 MED ORDER — AMLODIPINE BESYLATE 5 MG PO TABS: ORAL_TABLET | ORAL | Status: DC

## 2014-08-23 DIAGNOSIS — H2513 Age-related nuclear cataract, bilateral: Secondary | ICD-10-CM | POA: Diagnosis not present

## 2014-08-23 DIAGNOSIS — H179 Unspecified corneal scar and opacity: Secondary | ICD-10-CM | POA: Diagnosis not present

## 2014-08-23 DIAGNOSIS — H4011X2 Primary open-angle glaucoma, moderate stage: Secondary | ICD-10-CM | POA: Diagnosis not present

## 2014-08-23 DIAGNOSIS — Z961 Presence of intraocular lens: Secondary | ICD-10-CM | POA: Diagnosis not present

## 2014-09-12 ENCOUNTER — Telehealth: Payer: Self-pay | Admitting: Internal Medicine

## 2014-09-12 MED ORDER — EZETIMIBE 10 MG PO TABS: ORAL_TABLET | ORAL | Status: DC

## 2014-09-12 NOTE — Telephone Encounter (Signed)
Rf sent. See meds.  

## 2014-09-12 NOTE — Telephone Encounter (Signed)
Patient is requesting zetia to be sent to [facility] [address]

## 2014-09-21 ENCOUNTER — Other Ambulatory Visit: Payer: Self-pay | Admitting: Internal Medicine

## 2014-09-26 ENCOUNTER — Other Ambulatory Visit: Payer: Self-pay | Admitting: Internal Medicine

## 2014-10-09 ENCOUNTER — Encounter: Payer: Self-pay | Admitting: Internal Medicine

## 2014-10-09 ENCOUNTER — Encounter: Payer: Self-pay | Admitting: Gastroenterology

## 2014-10-10 ENCOUNTER — Other Ambulatory Visit: Payer: Self-pay | Admitting: *Deleted

## 2014-10-10 MED ORDER — SUCRALFATE 1 GM/10ML PO SUSP: ORAL | Status: DC

## 2014-10-24 ENCOUNTER — Other Ambulatory Visit: Payer: Self-pay | Admitting: Internal Medicine

## 2014-10-31 DIAGNOSIS — Z1231 Encounter for screening mammogram for malignant neoplasm of breast: Secondary | ICD-10-CM | POA: Diagnosis not present

## 2014-10-31 LAB — HM MAMMOGRAPHY

## 2014-11-01 ENCOUNTER — Encounter: Payer: Self-pay | Admitting: Internal Medicine

## 2014-11-12 ENCOUNTER — Telehealth: Payer: Self-pay | Admitting: Internal Medicine

## 2014-11-16 IMAGING — CR DG CHEST 2V
2 series · 2 of 2 positions shown · non-contrast
Comparison: 05/08/2009

CLINICAL DATA: Dyspnea, shortness of breath

CHEST - 2 VIEW

[view not recorded (1 of 2)]
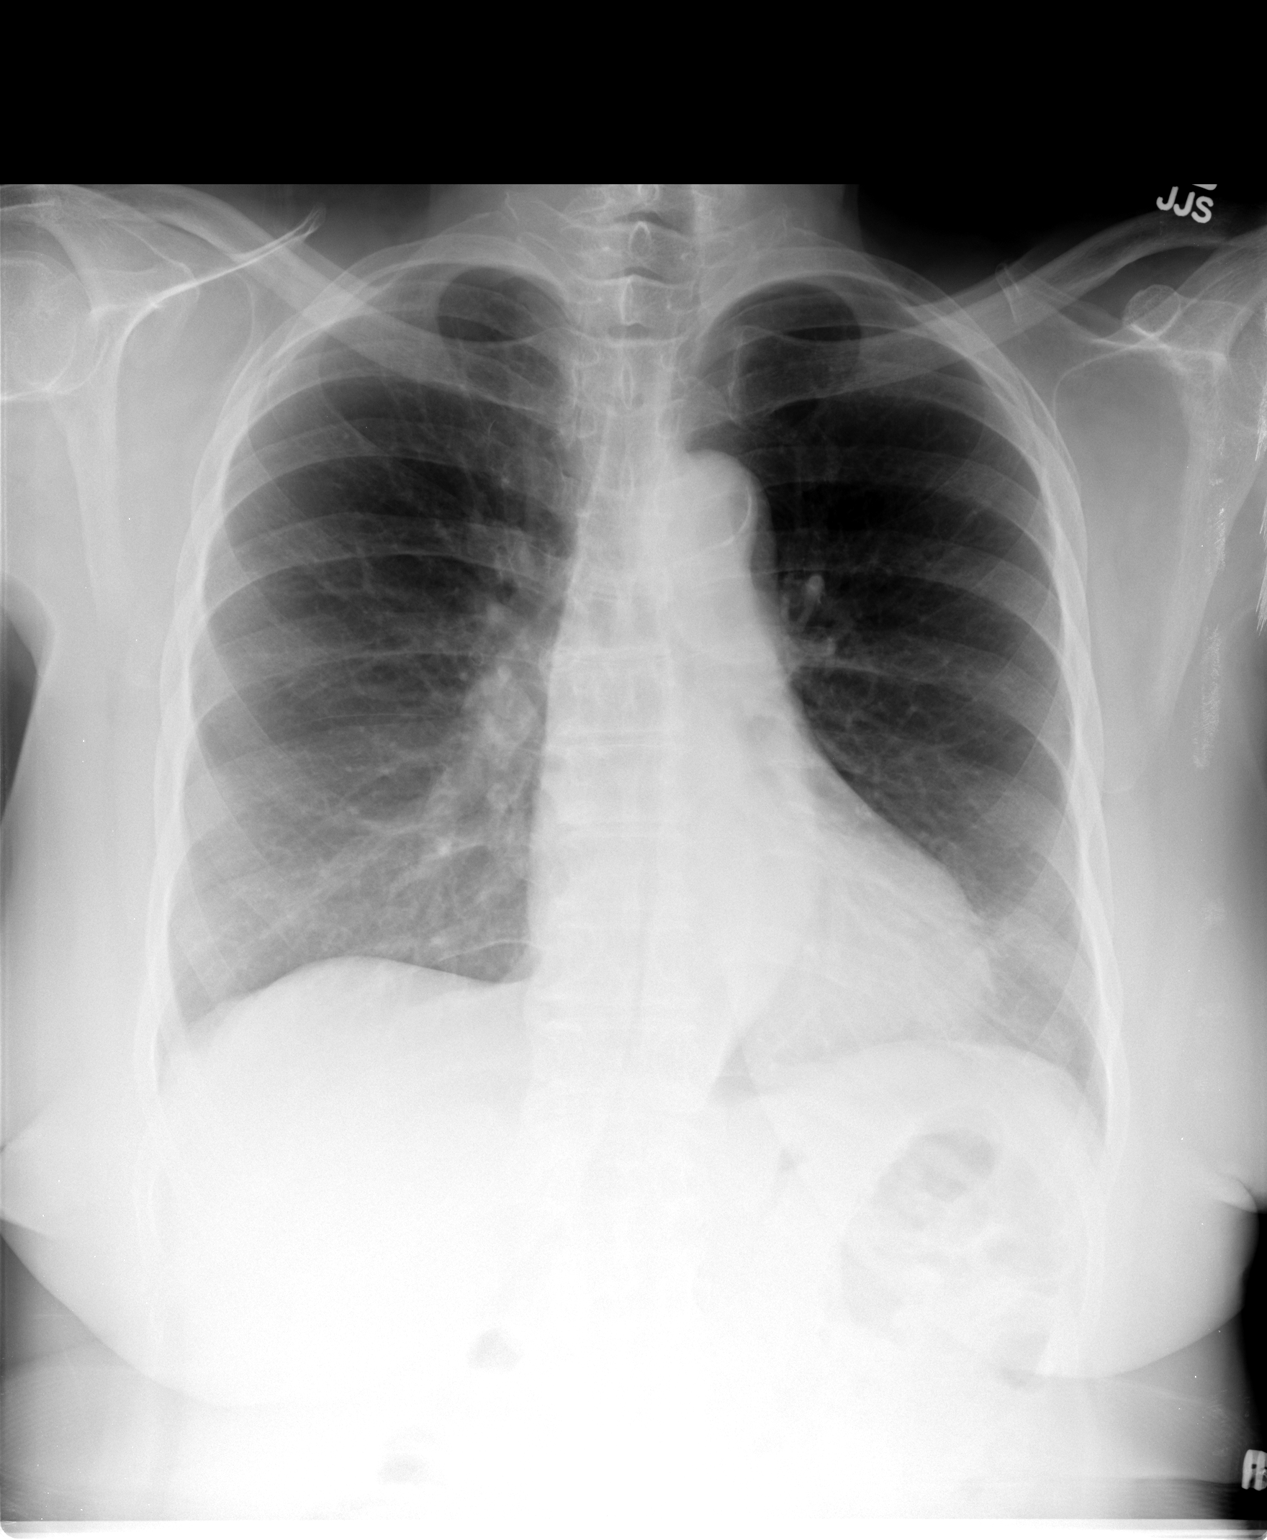

[view not recorded (2 of 2)]
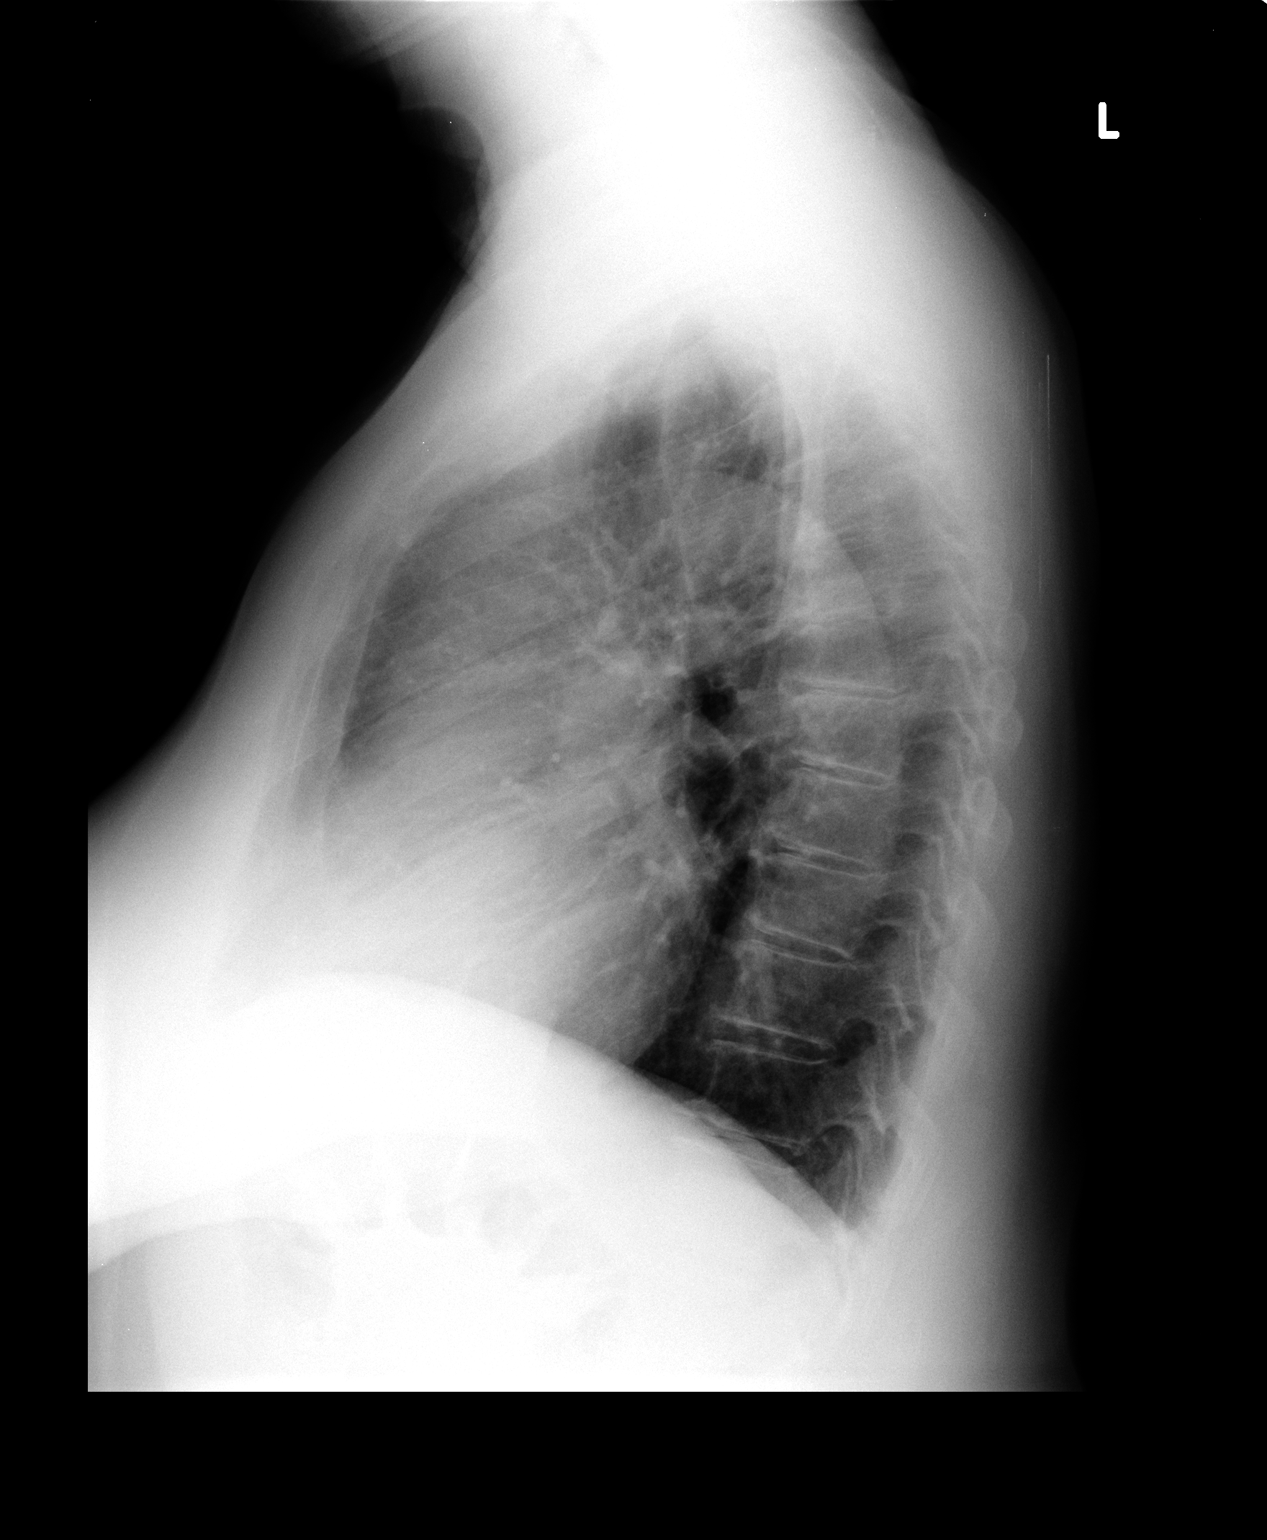

[2 of 2 positions shown; findings below may reference images not displayed]

FINDINGS: Cardiomediastinal silhouette is stable.  Atherosclerotic
calcifications of thoracic aorta again noted.  No acute infiltrate
or pleural effusion.  No pulmonary edema.  Stable mild degenerative
changes thoracic spine.
IMPRESSION: No active disease.  No significant change.

## 2014-12-06 ENCOUNTER — Encounter: Payer: Self-pay | Admitting: Internal Medicine

## 2014-12-06 ENCOUNTER — Ambulatory Visit (INDEPENDENT_AMBULATORY_CARE_PROVIDER_SITE_OTHER): Payer: Medicare Other | Admitting: Internal Medicine

## 2014-12-06 ENCOUNTER — Encounter: Payer: Medicare Other | Admitting: Internal Medicine

## 2014-12-06 ENCOUNTER — Other Ambulatory Visit (INDEPENDENT_AMBULATORY_CARE_PROVIDER_SITE_OTHER): Payer: Medicare Other

## 2014-12-06 VITALS — BP 138/70 | HR 56 | Ht 61.0 in | Wt 173.0 lb

## 2014-12-06 DIAGNOSIS — H409 Unspecified glaucoma: Secondary | ICD-10-CM

## 2014-12-06 DIAGNOSIS — Z23 Encounter for immunization: Secondary | ICD-10-CM | POA: Diagnosis not present

## 2014-12-06 DIAGNOSIS — R0989 Other specified symptoms and signs involving the circulatory and respiratory systems: Secondary | ICD-10-CM

## 2014-12-06 DIAGNOSIS — I10 Essential (primary) hypertension: Secondary | ICD-10-CM | POA: Diagnosis not present

## 2014-12-06 DIAGNOSIS — I6523 Occlusion and stenosis of bilateral carotid arteries: Secondary | ICD-10-CM | POA: Insufficient documentation

## 2014-12-06 DIAGNOSIS — Z0000 Encounter for general adult medical examination without abnormal findings: Secondary | ICD-10-CM

## 2014-12-06 LAB — LIPID PANEL
CHOL/HDL RATIO: 3
Cholesterol: 200 mg/dL (ref 0–200)
HDL: 58.7 mg/dL (ref >39.00)
LDL CALC: 125 mg/dL — AB (ref 0–99)
NONHDL: 141.23
TRIGLYCERIDES: 82 mg/dL (ref 0.0–149.0)
VLDL: 16.4 mg/dL (ref 0.0–40.0)

## 2014-12-06 LAB — CBC WITH DIFFERENTIAL/PLATELET
BASOS ABS: 0 10*3/uL (ref 0.0–0.1)
Basophils Relative: 0.3 % (ref 0.0–3.0)
EOS ABS: 0.7 10*3/uL (ref 0.0–0.7)
Eosinophils Relative: 10.4 % — ABNORMAL HIGH (ref 0.0–5.0)
HEMATOCRIT: 40 % (ref 36.0–46.0)
HEMOGLOBIN: 13 g/dL (ref 12.0–15.0)
LYMPHS PCT: 34.1 % (ref 12.0–46.0)
Lymphs Abs: 2.2 10*3/uL (ref 0.7–4.0)
MCHC: 32.4 g/dL (ref 30.0–36.0)
MCV: 84.1 fl (ref 78.0–100.0)
MONO ABS: 0.4 10*3/uL (ref 0.1–1.0)
Monocytes Relative: 5.8 % (ref 3.0–12.0)
Neutro Abs: 3.2 10*3/uL (ref 1.4–7.7)
Neutrophils Relative %: 49.4 % (ref 43.0–77.0)
Platelets: 278 10*3/uL (ref 150.0–400.0)
RBC: 4.76 Mil/uL (ref 3.87–5.11)
RDW: 14.5 % (ref 11.5–15.5)
WBC: 6.5 10*3/uL (ref 4.0–10.5)

## 2014-12-06 LAB — URINALYSIS, ROUTINE W REFLEX MICROSCOPIC
Bilirubin Urine: NEGATIVE
Hgb urine dipstick: NEGATIVE
Ketones, ur: NEGATIVE
Nitrite: NEGATIVE
PH: 7 (ref 5.0–8.0)
SPECIFIC GRAVITY, URINE: 1.01 (ref 1.000–1.030)
Total Protein, Urine: NEGATIVE
URINE GLUCOSE: NEGATIVE
UROBILINOGEN UA: 0.2 (ref 0.0–1.0)

## 2014-12-06 LAB — BASIC METABOLIC PANEL
BUN: 7 mg/dL (ref 6–23)
CO2: 30 mEq/L (ref 19–32)
Calcium: 9.5 mg/dL (ref 8.4–10.5)
Chloride: 106 mEq/L (ref 96–112)
Creatinine, Ser: 0.78 mg/dL (ref 0.40–1.20)
GFR: 91.59 mL/min (ref >60.00)
Glucose, Bld: 90 mg/dL (ref 70–99)
POTASSIUM: 4 meq/L (ref 3.5–5.1)
SODIUM: 143 meq/L (ref 135–145)

## 2014-12-06 LAB — HEPATIC FUNCTION PANEL
ALK PHOS: 78 U/L (ref 39–117)
ALT: 11 U/L (ref 0–35)
AST: 18 U/L (ref 0–37)
Albumin: 3.9 g/dL (ref 3.5–5.2)
BILIRUBIN DIRECT: 0.1 mg/dL (ref 0.0–0.3)
BILIRUBIN TOTAL: 0.5 mg/dL (ref 0.2–1.2)
Total Protein: 7.5 g/dL (ref 6.0–8.3)

## 2014-12-06 LAB — TSH: TSH: 1.53 u[IU]/mL (ref 0.35–4.50)

## 2014-12-06 MED ORDER — DICLOFENAC SODIUM 1 % TD GEL: 4.0000 g | Freq: Four times a day (QID) | TRANSDERMAL | Status: DC

## 2014-12-06 NOTE — Assessment & Plan Note (Signed)
Doppler US 

## 2014-12-06 NOTE — Assessment & Plan Note (Signed)
Medications - Losartan, Bystolic, Furosemide, Amlodipoine

## 2014-12-06 NOTE — Progress Notes (Signed)
Pre visit review using our clinic review tool, if applicable. No additional management support is needed unless otherwise documented below in the visit note. 

## 2014-12-06 NOTE — Assessment & Plan Note (Addendum)
  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 The patient's medical, surgical and social history  2. Their use of alcohol, tobacco or illicit drugs  3. Their current medications and supplements  4. The patient's functional ability including ADL's, fall risks, home safety risks and hearing or visual impairment.  5. Diet and physical activities  6. Evidence for depression or mood disorders 7. The roster of all physicians providing medical care to patient - is listed in the Snapshot section of the chart and reviewed today.    Today patient counseled on age appropriate routine health concerns for screening and prevention, each reviewed and up to date or declined. Immunizations reviewed and up to date or declined. Labs ordered and reviewed. Risk factors for depression reviewed and negative. Hearing function and visual acuity are intact. ADLs screened and addressed as needed. Functional ability and level of safety reviewed and appropriate. Education, counseling and referrals performed based on assessed risks today. Patient provided with a copy of personalized plan for preventive services.

## 2014-12-06 NOTE — Assessment & Plan Note (Signed)
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

## 2014-12-06 NOTE — Patient Instructions (Signed)
Preventive Care for Adults A healthy lifestyle and preventive care can promote health and wellness. Preventive health guidelines for women include the following key practices.  A routine yearly physical is a good way to check with your health care provider about your health and preventive screening. It is a chance to share any concerns and updates on your health and to receive a thorough exam.  Visit your dentist for a routine exam and preventive care every 6 months. Brush your teeth twice a day and floss once a day. Good oral hygiene prevents tooth decay and gum disease.  The frequency of eye exams is based on your age, health, family medical history, use of contact lenses, and other factors. Follow your health care provider's recommendations for frequency of eye exams.  Eat a healthy diet. Foods like vegetables, fruits, whole grains, low-fat dairy products, and lean protein foods contain the nutrients you need without too many calories. Decrease your intake of foods high in solid fats, added sugars, and salt. Eat the right amount of calories for you.Get information about a proper diet from your health care provider, if necessary.  Regular physical exercise is one of the most important things you can do for your health. Most adults should get at least 150 minutes of moderate-intensity exercise (any activity that increases your heart rate and causes you to sweat) each week. In addition, most adults need muscle-strengthening exercises on 2 or more days a week.  Maintain a healthy weight. The body mass index (BMI) is a screening tool to identify possible weight problems. It provides an estimate of body fat based on height and weight. Your health care provider can find your BMI and can help you achieve or maintain a healthy weight.For adults 20 years and older:  A BMI below 18.5 is considered underweight.  A BMI of 18.5 to 24.9 is normal.  A BMI of 25 to 29.9 is considered overweight.  A BMI of  30 and above is considered obese.  Maintain normal blood lipids and cholesterol levels by exercising and minimizing your intake of saturated fat. Eat a balanced diet with plenty of fruit and vegetables. Blood tests for lipids and cholesterol should begin at age 76 and be repeated every 5 years. If your lipid or cholesterol levels are high, you are over 50, or you are at high risk for heart disease, you may need your cholesterol levels checked more frequently.Ongoing high lipid and cholesterol levels should be treated with medicines if diet and exercise are not working.  If you smoke, find out from your health care provider how to quit. If you do not use tobacco, do not start.  Lung cancer screening is recommended for adults aged 22-80 years who are at high risk for developing lung cancer because of a history of smoking. A yearly low-dose CT scan of the lungs is recommended for people who have at least a 30-pack-year history of smoking and are a current smoker or have quit within the past 15 years. A pack year of smoking is smoking an average of 1 pack of cigarettes a day for 1 year (for example: 1 pack a day for 30 years or 2 packs a day for 15 years). Yearly screening should continue until the smoker has stopped smoking for at least 15 years. Yearly screening should be stopped for people who develop a health problem that would prevent them from having lung cancer treatment.  If you are pregnant, do not drink alcohol. If you are breastfeeding,  be very cautious about drinking alcohol. If you are not pregnant and choose to drink alcohol, do not have more than 1 drink per day. One drink is considered to be 12 ounces (355 mL) of beer, 5 ounces (148 mL) of wine, or 1.5 ounces (44 mL) of liquor.  Avoid use of street drugs. Do not share needles with anyone. Ask for help if you need support or instructions about stopping the use of drugs.  High blood pressure causes heart disease and increases the risk of  stroke. Your blood pressure should be checked at least every 1 to 2 years. Ongoing high blood pressure should be treated with medicines if weight loss and exercise do not work.  If you are 75-52 years old, ask your health care provider if you should take aspirin to prevent strokes.  Diabetes screening involves taking a blood sample to check your fasting blood sugar level. This should be done once every 3 years, after age 15, if you are within normal weight and without risk factors for diabetes. Testing should be considered at a younger age or be carried out more frequently if you are overweight and have at least 1 risk factor for diabetes.  Breast cancer screening is essential preventive care for women. You should practice "breast self-awareness." This means understanding the normal appearance and feel of your breasts and may include breast self-examination. Any changes detected, no matter how small, should be reported to a health care provider. Women in their 58s and 30s should have a clinical breast exam (CBE) by a health care provider as part of a regular health exam every 1 to 3 years. After age 16, women should have a CBE every year. Starting at age 53, women should consider having a mammogram (breast X-ray test) every year. Women who have a family history of breast cancer should talk to their health care provider about genetic screening. Women at a high risk of breast cancer should talk to their health care providers about having an MRI and a mammogram every year.  Breast cancer gene (BRCA)-related cancer risk assessment is recommended for women who have family members with BRCA-related cancers. BRCA-related cancers include breast, ovarian, tubal, and peritoneal cancers. Having family members with these cancers may be associated with an increased risk for harmful changes (mutations) in the breast cancer genes BRCA1 and BRCA2. Results of the assessment will determine the need for genetic counseling and  BRCA1 and BRCA2 testing.  Routine pelvic exams to screen for cancer are no longer recommended for nonpregnant women who are considered low risk for cancer of the pelvic organs (ovaries, uterus, and vagina) and who do not have symptoms. Ask your health care provider if a screening pelvic exam is right for you.  If you have had past treatment for cervical cancer or a condition that could lead to cancer, you need Pap tests and screening for cancer for at least 20 years after your treatment. If Pap tests have been discontinued, your risk factors (such as having a new sexual partner) need to be reassessed to determine if screening should be resumed. Some women have medical problems that increase the chance of getting cervical cancer. In these cases, your health care provider may recommend more frequent screening and Pap tests.  The HPV test is an additional test that may be used for cervical cancer screening. The HPV test looks for the virus that can cause the cell changes on the cervix. The cells collected during the Pap test can be  tested for HPV. The HPV test could be used to screen women aged 30 years and older, and should be used in women of any age who have unclear Pap test results. After the age of 30, women should have HPV testing at the same frequency as a Pap test.  Colorectal cancer can be detected and often prevented. Most routine colorectal cancer screening begins at the age of 50 years and continues through age 75 years. However, your health care provider may recommend screening at an earlier age if you have risk factors for colon cancer. On a yearly basis, your health care provider may provide home test kits to check for hidden blood in the stool. Use of a small camera at the end of a tube, to directly examine the colon (sigmoidoscopy or colonoscopy), can detect the earliest forms of colorectal cancer. Talk to your health care provider about this at age 50, when routine screening begins. Direct  exam of the colon should be repeated every 5-10 years through age 75 years, unless early forms of pre-cancerous polyps or small growths are found.  People who are at an increased risk for hepatitis B should be screened for this virus. You are considered at high risk for hepatitis B if:  You were born in a country where hepatitis B occurs often. Talk with your health care provider about which countries are considered high risk.  Your parents were born in a high-risk country and you have not received a shot to protect against hepatitis B (hepatitis B vaccine).  You have HIV or AIDS.  You use needles to inject street drugs.  You live with, or have sex with, someone who has hepatitis B.  You get hemodialysis treatment.  You take certain medicines for conditions like cancer, organ transplantation, and autoimmune conditions.  Hepatitis C blood testing is recommended for all people born from 1945 through 1965 and any individual with known risks for hepatitis C.  Practice safe sex. Use condoms and avoid high-risk sexual practices to reduce the spread of sexually transmitted infections (STIs). STIs include gonorrhea, chlamydia, syphilis, trichomonas, herpes, HPV, and human immunodeficiency virus (HIV). Herpes, HIV, and HPV are viral illnesses that have no cure. They can result in disability, cancer, and death.  You should be screened for sexually transmitted illnesses (STIs) including gonorrhea and chlamydia if:  You are sexually active and are younger than 24 years.  You are older than 24 years and your health care provider tells you that you are at risk for this type of infection.  Your sexual activity has changed since you were last screened and you are at an increased risk for chlamydia or gonorrhea. Ask your health care provider if you are at risk.  If you are at risk of being infected with HIV, it is recommended that you take a prescription medicine daily to prevent HIV infection. This is  called preexposure prophylaxis (PrEP). You are considered at risk if:  You are a heterosexual woman, are sexually active, and are at increased risk for HIV infection.  You take drugs by injection.  You are sexually active with a partner who has HIV.  Talk with your health care provider about whether you are at high risk of being infected with HIV. If you choose to begin PrEP, you should first be tested for HIV. You should then be tested every 3 months for as long as you are taking PrEP.  Osteoporosis is a disease in which the bones lose minerals and strength   with aging. This can result in serious bone fractures or breaks. The risk of osteoporosis can be identified using a bone density scan. Women ages 65 years and over and women at risk for fractures or osteoporosis should discuss screening with their health care providers. Ask your health care provider whether you should take a calcium supplement or vitamin D to reduce the rate of osteoporosis.  Menopause can be associated with physical symptoms and risks. Hormone replacement therapy is available to decrease symptoms and risks. You should talk to your health care provider about whether hormone replacement therapy is right for you.  Use sunscreen. Apply sunscreen liberally and repeatedly throughout the day. You should seek shade when your shadow is shorter than you. Protect yourself by wearing long sleeves, pants, a wide-brimmed hat, and sunglasses year round, whenever you are outdoors.  Once a month, do a whole body skin exam, using a mirror to look at the skin on your back. Tell your health care provider of new moles, moles that have irregular borders, moles that are larger than a pencil eraser, or moles that have changed in shape or color.  Stay current with required vaccines (immunizations).  Influenza vaccine. All adults should be immunized every year.  Tetanus, diphtheria, and acellular pertussis (Td, Tdap) vaccine. Pregnant women should  receive 1 dose of Tdap vaccine during each pregnancy. The dose should be obtained regardless of the length of time since the last dose. Immunization is preferred during the 27th-36th week of gestation. An adult who has not previously received Tdap or who does not know her vaccine status should receive 1 dose of Tdap. This initial dose should be followed by tetanus and diphtheria toxoids (Td) booster doses every 10 years. Adults with an unknown or incomplete history of completing a 3-dose immunization series with Td-containing vaccines should begin or complete a primary immunization series including a Tdap dose. Adults should receive a Td booster every 10 years.  Varicella vaccine. An adult without evidence of immunity to varicella should receive 2 doses or a second dose if she has previously received 1 dose. Pregnant females who do not have evidence of immunity should receive the first dose after pregnancy. This first dose should be obtained before leaving the health care facility. The second dose should be obtained 4-8 weeks after the first dose.  Human papillomavirus (HPV) vaccine. Females aged 13-26 years who have not received the vaccine previously should obtain the 3-dose series. The vaccine is not recommended for use in pregnant females. However, pregnancy testing is not needed before receiving a dose. If a female is found to be pregnant after receiving a dose, no treatment is needed. In that case, the remaining doses should be delayed until after the pregnancy. Immunization is recommended for any person with an immunocompromised condition through the age of 26 years if she did not get any or all doses earlier. During the 3-dose series, the second dose should be obtained 4-8 weeks after the first dose. The third dose should be obtained 24 weeks after the first dose and 16 weeks after the second dose.  Zoster vaccine. One dose is recommended for adults aged 60 years or older unless certain conditions are  present.  Measles, mumps, and rubella (MMR) vaccine. Adults born before 1957 generally are considered immune to measles and mumps. Adults born in 1957 or later should have 1 or more doses of MMR vaccine unless there is a contraindication to the vaccine or there is laboratory evidence of immunity to   each of the three diseases. A routine second dose of MMR vaccine should be obtained at least 28 days after the first dose for students attending postsecondary schools, health care workers, or international travelers. People who received inactivated measles vaccine or an unknown type of measles vaccine during 1963-1967 should receive 2 doses of MMR vaccine. People who received inactivated mumps vaccine or an unknown type of mumps vaccine before 1979 and are at high risk for mumps infection should consider immunization with 2 doses of MMR vaccine. For females of childbearing age, rubella immunity should be determined. If there is no evidence of immunity, females who are not pregnant should be vaccinated. If there is no evidence of immunity, females who are pregnant should delay immunization until after pregnancy. Unvaccinated health care workers born before 1957 who lack laboratory evidence of measles, mumps, or rubella immunity or laboratory confirmation of disease should consider measles and mumps immunization with 2 doses of MMR vaccine or rubella immunization with 1 dose of MMR vaccine.  Pneumococcal 13-valent conjugate (PCV13) vaccine. When indicated, a person who is uncertain of her immunization history and has no record of immunization should receive the PCV13 vaccine. An adult aged 19 years or older who has certain medical conditions and has not been previously immunized should receive 1 dose of PCV13 vaccine. This PCV13 should be followed with a dose of pneumococcal polysaccharide (PPSV23) vaccine. The PPSV23 vaccine dose should be obtained at least 8 weeks after the dose of PCV13 vaccine. An adult aged 19  years or older who has certain medical conditions and previously received 1 or more doses of PPSV23 vaccine should receive 1 dose of PCV13. The PCV13 vaccine dose should be obtained 1 or more years after the last PPSV23 vaccine dose.  Pneumococcal polysaccharide (PPSV23) vaccine. When PCV13 is also indicated, PCV13 should be obtained first. All adults aged 65 years and older should be immunized. An adult younger than age 65 years who has certain medical conditions should be immunized. Any person who resides in a nursing home or long-term care facility should be immunized. An adult smoker should be immunized. People with an immunocompromised condition and certain other conditions should receive both PCV13 and PPSV23 vaccines. People with human immunodeficiency virus (HIV) infection should be immunized as soon as possible after diagnosis. Immunization during chemotherapy or radiation therapy should be avoided. Routine use of PPSV23 vaccine is not recommended for American Indians, [place] Natives, or people younger than 65 years unless there are medical conditions that require PPSV23 vaccine. When indicated, people who have unknown immunization and have no record of immunization should receive PPSV23 vaccine. One-time revaccination 5 years after the first dose of PPSV23 is recommended for people aged 19-64 years who have chronic kidney failure, nephrotic syndrome, asplenia, or immunocompromised conditions. People who received 1-2 doses of PPSV23 before age 65 years should receive another dose of PPSV23 vaccine at age 65 years or later if at least 5 years have passed since the previous dose. Doses of PPSV23 are not needed for people immunized with PPSV23 at or after age 65 years.  Meningococcal vaccine. Adults with asplenia or persistent complement component deficiencies should receive 2 doses of quadrivalent meningococcal conjugate (MenACWY-D) vaccine. The doses should be obtained at least 2 months apart.  Microbiologists working with certain meningococcal bacteria, military recruits, people at risk during an outbreak, and people who travel to or live in countries with a high rate of meningitis should be immunized. A first-year college student up through age   21 years who is living in a residence hall should receive a dose if she did not receive a dose on or after her 16th birthday. Adults who have certain high-risk conditions should receive one or more doses of vaccine.  Hepatitis A vaccine. Adults who wish to be protected from this disease, have certain high-risk conditions, work with hepatitis A-infected animals, work in hepatitis A research labs, or travel to or work in countries with a high rate of hepatitis A should be immunized. Adults who were previously unvaccinated and who anticipate close contact with an international adoptee during the first 60 days after arrival in the [place] from a country with a high rate of hepatitis A should be immunized.  Hepatitis B vaccine. Adults who wish to be protected from this disease, have certain high-risk conditions, may be exposed to blood or other infectious body fluids, are household contacts or sex partners of hepatitis B positive people, are clients or workers in certain care facilities, or travel to or work in countries with a high rate of hepatitis B should be immunized.  Haemophilus influenzae type b (Hib) vaccine. A previously unvaccinated person with asplenia or sickle cell disease or having a scheduled splenectomy should receive 1 dose of Hib vaccine. Regardless of previous immunization, a recipient of a hematopoietic stem cell transplant should receive a 3-dose series 6-12 months after her successful transplant. Hib vaccine is not recommended for adults with HIV infection. Preventive Services / Frequency Ages 64 to 68 years  Blood pressure check.** / Every 1 to 2 years.  Lipid and cholesterol check.** / Every 5 years beginning at age  22.  Clinical breast exam.** / Every 3 years for women in their 88s and 53s.  BRCA-related cancer risk assessment.** / For women who have family members with a BRCA-related cancer (breast, ovarian, tubal, or peritoneal cancers).  Pap test.** / Every 2 years from ages 90 through 51. Every 3 years starting at age 21 through age 56 or 3 with a history of 3 consecutive normal Pap tests.  HPV screening.** / Every 3 years from ages 24 through ages 1 to 46 with a history of 3 consecutive normal Pap tests.  Hepatitis C blood test.** / For any individual with known risks for hepatitis C.  Skin self-exam. / Monthly.  Influenza vaccine. / Every year.  Tetanus, diphtheria, and acellular pertussis (Tdap, Td) vaccine.** / Consult your health care provider. Pregnant women should receive 1 dose of Tdap vaccine during each pregnancy. 1 dose of Td every 10 years.  Varicella vaccine.** / Consult your health care provider. Pregnant females who do not have evidence of immunity should receive the first dose after pregnancy.  HPV vaccine. / 3 doses over 6 months, if 72 and younger. The vaccine is not recommended for use in pregnant females. However, pregnancy testing is not needed before receiving a dose.  Measles, mumps, rubella (MMR) vaccine.** / You need at least 1 dose of MMR if you were born in 1957 or later. You may also need a 2nd dose. For females of childbearing age, rubella immunity should be determined. If there is no evidence of immunity, females who are not pregnant should be vaccinated. If there is no evidence of immunity, females who are pregnant should delay immunization until after pregnancy.  Pneumococcal 13-valent conjugate (PCV13) vaccine.** / Consult your health care provider.  Pneumococcal polysaccharide (PPSV23) vaccine.** / 1 to 2 doses if you smoke cigarettes or if you have certain conditions.  Meningococcal vaccine.** /  1 dose if you are age 19 to 21 years and a first-year college  student living in a residence hall, or have one of several medical conditions, you need to get vaccinated against meningococcal disease. You may also need additional booster doses.  Hepatitis A vaccine.** / Consult your health care provider.  Hepatitis B vaccine.** / Consult your health care provider.  Haemophilus influenzae type b (Hib) vaccine.** / Consult your health care provider. Ages 40 to 64 years  Blood pressure check.** / Every 1 to 2 years.  Lipid and cholesterol check.** / Every 5 years beginning at age 20 years.  Lung cancer screening. / Every year if you are aged 55-80 years and have a 30-pack-year history of smoking and currently smoke or have quit within the past 15 years. Yearly screening is stopped once you have quit smoking for at least 15 years or develop a health problem that would prevent you from having lung cancer treatment.  Clinical breast exam.** / Every year after age 40 years.  BRCA-related cancer risk assessment.** / For women who have family members with a BRCA-related cancer (breast, ovarian, tubal, or peritoneal cancers).  Mammogram.** / Every year beginning at age 40 years and continuing for as long as you are in good health. Consult with your health care provider.  Pap test.** / Every 3 years starting at age 30 years through age 65 or 70 years with a history of 3 consecutive normal Pap tests.  HPV screening.** / Every 3 years from ages 30 years through ages 65 to 70 years with a history of 3 consecutive normal Pap tests.  Fecal occult blood test (FOBT) of stool. / Every year beginning at age 50 years and continuing until age 75 years. You may not need to do this test if you get a colonoscopy every 10 years.  Flexible sigmoidoscopy or colonoscopy.** / Every 5 years for a flexible sigmoidoscopy or every 10 years for a colonoscopy beginning at age 50 years and continuing until age 75 years.  Hepatitis C blood test.** / For all people born from 1945 through  1965 and any individual with known risks for hepatitis C.  Skin self-exam. / Monthly.  Influenza vaccine. / Every year.  Tetanus, diphtheria, and acellular pertussis (Tdap/Td) vaccine.** / Consult your health care provider. Pregnant women should receive 1 dose of Tdap vaccine during each pregnancy. 1 dose of Td every 10 years.  Varicella vaccine.** / Consult your health care provider. Pregnant females who do not have evidence of immunity should receive the first dose after pregnancy.  Zoster vaccine.** / 1 dose for adults aged 60 years or older.  Measles, mumps, rubella (MMR) vaccine.** / You need at least 1 dose of MMR if you were born in 1957 or later. You may also need a 2nd dose. For females of childbearing age, rubella immunity should be determined. If there is no evidence of immunity, females who are not pregnant should be vaccinated. If there is no evidence of immunity, females who are pregnant should delay immunization until after pregnancy.  Pneumococcal 13-valent conjugate (PCV13) vaccine.** / Consult your health care provider.  Pneumococcal polysaccharide (PPSV23) vaccine.** / 1 to 2 doses if you smoke cigarettes or if you have certain conditions.  Meningococcal vaccine.** / Consult your health care provider.  Hepatitis A vaccine.** / Consult your health care provider.  Hepatitis B vaccine.** / Consult your health care provider.  Haemophilus influenzae type b (Hib) vaccine.** / Consult your health care provider. Ages 65   years and over  Blood pressure check.** / Every 1 to 2 years.  Lipid and cholesterol check.** / Every 5 years beginning at age 22 years.  Lung cancer screening. / Every year if you are aged 73-80 years and have a 30-pack-year history of smoking and currently smoke or have quit within the past 15 years. Yearly screening is stopped once you have quit smoking for at least 15 years or develop a health problem that would prevent you from having lung cancer  treatment.  Clinical breast exam.** / Every year after age 4 years.  BRCA-related cancer risk assessment.** / For women who have family members with a BRCA-related cancer (breast, ovarian, tubal, or peritoneal cancers).  Mammogram.** / Every year beginning at age 40 years and continuing for as long as you are in good health. Consult with your health care provider.  Pap test.** / Every 3 years starting at age 9 years through age 34 or 91 years with 3 consecutive normal Pap tests. Testing can be stopped between 65 and 70 years with 3 consecutive normal Pap tests and no abnormal Pap or HPV tests in the past 10 years.  HPV screening.** / Every 3 years from ages 57 years through ages 64 or 45 years with a history of 3 consecutive normal Pap tests. Testing can be stopped between 65 and 70 years with 3 consecutive normal Pap tests and no abnormal Pap or HPV tests in the past 10 years.  Fecal occult blood test (FOBT) of stool. / Every year beginning at age 15 years and continuing until age 17 years. You may not need to do this test if you get a colonoscopy every 10 years.  Flexible sigmoidoscopy or colonoscopy.** / Every 5 years for a flexible sigmoidoscopy or every 10 years for a colonoscopy beginning at age 86 years and continuing until age 71 years.  Hepatitis C blood test.** / For all people born from [birth year] through 1963 and any individual with known risks for hepatitis C.  Osteoporosis screening.** / A one-time screening for women ages 83 years and over and women at risk for fractures or osteoporosis.  Skin self-exam. / Monthly.  Influenza vaccine. / Every year.  Tetanus, diphtheria, and acellular pertussis (Tdap/Td) vaccine.** / 1 dose of Td every 10 years.  Varicella vaccine.** / Consult your health care provider.  Zoster vaccine.** / 1 dose for adults aged 61 years or older.  Pneumococcal 13-valent conjugate (PCV13) vaccine.** / Consult your health care provider.  Pneumococcal  polysaccharide (PPSV23) vaccine.** / 1 dose for all adults aged 28 years and older.  Meningococcal vaccine.** / Consult your health care provider.  Hepatitis A vaccine.** / Consult your health care provider.  Hepatitis B vaccine.** / Consult your health care provider.  Haemophilus influenzae type b (Hib) vaccine.** / Consult your health care provider. ** Family history and personal history of risk and conditions may change your health care provider's recommendations. Document Released: 04/28/2001 Document Revised: 07/17/2013 Document Reviewed: 07/28/2010 [facility] Patient Information 2015 [place], [place]. This information is not intended to replace advice given to you by your health care provider. Make sure you discuss any questions you have with your health care provider.

## 2014-12-06 NOTE — Progress Notes (Signed)
Subjective:  Patient ID: [name], female    DOB: [birth year]  Age: 81 y.o. MRN: [id number]  CC: No chief complaint on file.   HPI [name] presents for a  Well exam. F/u HTN, asthma, dyslipidemia f/u  Outpatient Prescriptions Prior to Visit  Medication Sig Dispense Refill  . albuterol (PROAIR HFA) 108 (90 BASE) MCG/ACT inhaler Inhale 2 puffs into the lungs every 6 (six) hours as needed for wheezing or shortness of breath. 8.5 g 5  . amLODipine (NORVASC) 5 MG tablet take 1 tablet by mouth once daily 30 tablet 11  . aspirin EC 81 MG tablet Take 1 tablet (81 mg total) by mouth daily.    . Azelastine HCl 0.15 % SOLN Place 1-2 sprays into the nose daily. 30 mL 5  . BYSTOLIC 10 MG tablet take 2 tablets by mouth once daily 60 tablet 11  . dorzolamide-timolol (COSOPT) 22.3-6.8 MG/ML ophthalmic solution Place 1 drop into both eyes 3 (three) times daily.      Marland Kitchen ezetimibe (ZETIA) 10 MG tablet take 1 tablet by mouth once daily 30 tablet 11  . fexofenadine (ALLEGRA) 180 MG tablet Take 180 mg by mouth daily.      . furosemide (LASIX) 40 MG tablet take 1 tablet by mouth once daily 30 tablet 5  . losartan (COZAAR) 100 MG tablet take 1 tablet by mouth once daily 30 tablet 5  . Multiple Vitamins-Minerals (MULTIVITAMIN WITH MINERALS) tablet Take 1 tablet by mouth daily.    Marland Kitchen omeprazole (PRILOSEC) 40 MG capsule Take 1 capsule (40 mg total) by mouth daily. 30 capsule 11  . pilocarpine (PILOCAR) 4 % ophthalmic solution Place 1 drop into the right eye at bedtime.    Marland Kitchen QVAR 80 MCG/ACT inhaler Inhale 2 puffs into the lungs 2 (two) times daily.    . sucralfate (CARAFATE) 1 GM/10ML suspension take 10 milliliters by mouth twice a day 420 mL 2  . XOPENEX HFA 45 MCG/ACT inhaler take 1 to 2 puffs at bedtime 15 g 5  . losartan (COZAAR) 100 MG tablet take 1 tablet by mouth once daily 30 tablet 5  . Nebivolol HCl (BYSTOLIC) 20 MG TABS Take 1 tablet (20 mg total) by mouth daily. 30 tablet 11   No  facility-administered medications prior to visit.    ROS Review of Systems  Constitutional: Negative for chills, activity change, appetite change, fatigue and unexpected weight change.  HENT: Negative for congestion, mouth sores and sinus pressure.   Eyes: Negative for visual disturbance.  Respiratory: Negative for cough and chest tightness.   Gastrointestinal: Negative for nausea and abdominal pain.  Genitourinary: Negative for frequency, difficulty urinating and vaginal pain.  Musculoskeletal: Negative for back pain and gait problem.  Skin: Negative for pallor and rash.  Neurological: Negative for dizziness, tremors, weakness, numbness and headaches.  Psychiatric/Behavioral: Negative for suicidal ideas, confusion and sleep disturbance.    Objective:  BP 138/70 mmHg  Pulse 56  Ht 5\' 1"  (1.549 m)  Wt 173 lb (78.472 kg)  BMI 32.70 kg/m2  SpO2 98%  BP Readings from Last 3 Encounters:  12/06/14 138/70  05/30/14 125/85  03/02/14 130/85    Wt Readings from Last 3 Encounters:  12/06/14 173 lb (78.472 kg)  05/30/14 174 lb (78.926 kg)  03/02/14 172 lb (78.019 kg)    Physical Exam  Constitutional: She appears well-developed. No distress.  HENT:  Head: Normocephalic.  Right Ear: External ear normal.  Left Ear: External ear normal.  Nose: Nose normal.  Mouth/Throat: Oropharynx is clear and moist.  Eyes: Conjunctivae are normal. Pupils are equal, round, and reactive to light. Right eye exhibits no discharge. Left eye exhibits no discharge.  Neck: Normal range of motion. Neck supple. No JVD present. No tracheal deviation present. No thyromegaly present.  Cardiovascular: Normal rate, regular rhythm and normal heart sounds.   Pulmonary/Chest: No stridor. No respiratory distress. She has no wheezes.  Abdominal: Soft. Bowel sounds are normal. She exhibits no distension and no mass. There is no tenderness. There is no rebound and no guarding.  Musculoskeletal: She exhibits no edema or  tenderness.  Lymphadenopathy:    She has no cervical adenopathy.  Neurological: She displays normal reflexes. No cranial nerve deficit. She exhibits normal muscle tone. Coordination abnormal.  Skin: No rash noted. No erythema.  Psychiatric: She has a normal mood and affect. Her behavior is normal. Judgment and thought content normal.  R bruit Obese  Lab Results  Component Value Date   WBC 6.5 12/06/2014   HGB 13.0 12/06/2014   HCT 40.0 12/06/2014   PLT 278.0 12/06/2014   GLUCOSE 90 12/06/2014   CHOL 200 12/06/2014   TRIG 82.0 12/06/2014   HDL 58.70 12/06/2014   LDLCALC 125* 12/06/2014   ALT 11 12/06/2014   AST 18 12/06/2014   NA 143 12/06/2014   K 4.0 12/06/2014   CL 106 12/06/2014   CREATININE 0.78 12/06/2014   BUN 7 12/06/2014   CO2 30 12/06/2014   TSH 1.53 12/06/2014    US Abdomen Complete  11/02/2013   CLINICAL DATA:  Upper abdominal mass, indigestion, bloating  EXAM: ULTRASOUND ABDOMEN COMPLETE  COMPARISON:  None.  FINDINGS: Gallbladder:  The gallbladder is visualized and no gallstones are noted. There is no pain over the gallbladder with compression.  Common bile duct:  Diameter: The common bile duct is normal measuring 5 mm in diameter.  Liver:  The liver has a normal echogenic pattern. No focal abnormality is seen.  IVC:  No abnormality visualized.  Pancreas:  Visualized portion unremarkable.  Spleen:  The spleen is normal measuring 8.5 cm sagittally.  Right Kidney:  Length: 9.9 cm.  No hydronephrosis is seen.  Left Kidney:  Length: 9.7 cm.  No hydronephrosis is noted.  Abdominal aorta:  The abdominal aorta is normal in caliber.  Other findings:  None.  IMPRESSION: Negative abdominal ultrasound. No abdominal mass is detected. No gallstones are noted.   Electronically Signed   By: Dwyane Dee M.D.   On: 11/02/2013 10:41    Assessment & Plan:   Diagnoses and all orders for this visit:  Well adult exam -     Urinalysis; Future -     TSH; Future -     Basic metabolic  panel; Future -     CBC with Differential/Platelet; Future -     Hepatic function panel; Future -     Lipid panel; Future  Essential hypertension -     Urinalysis; Future -     TSH; Future -     Basic metabolic panel; Future -     CBC with Differential/Platelet; Future -     Hepatic function panel; Future -     Lipid panel; Future  Glaucoma -     Urinalysis; Future -     TSH; Future -     Basic metabolic panel; Future -     CBC with Differential/Platelet; Future -     Hepatic function panel; Future -  Lipid panel; Future  Right carotid bruit -     Urinalysis; Future -     TSH; Future -     Basic metabolic panel; Future -     CBC with Differential/Platelet; Future -     Hepatic function panel; Future -     Lipid panel; Future -     US Carotid Bilateral  Need for influenza vaccination -     Flu Vaccine QUAD 36+ mos IM  Other orders -     diclofenac sodium (VOLTAREN) 1 % GEL; Apply 4 g topically 4 (four) times daily.  I have discontinued Ms. Wassel's Nebivolol HCl. I have also changed her VOLTAREN to diclofenac sodium. Additionally, I am having her maintain her fexofenadine, dorzolamide-timolol, QVAR, pilocarpine, multivitamin with minerals, aspirin EC, XOPENEX HFA, albuterol, Azelastine HCl, omeprazole, amLODipine, ezetimibe, furosemide, losartan, sucralfate, and BYSTOLIC.  Meds ordered this encounter  Medications  . diclofenac sodium (VOLTAREN) 1 % GEL    Sig: Apply 4 g topically 4 (four) times daily.    Dispense:  200 g    Refill:  4     Follow-up: Return in about 6 months (around 06/05/2015) for a follow-up visit.  Sonda Primes, MD

## 2014-12-10 DIAGNOSIS — H4011X1 Primary open-angle glaucoma, mild stage: Secondary | ICD-10-CM | POA: Diagnosis not present

## 2015-01-29 ENCOUNTER — Other Ambulatory Visit: Payer: Self-pay | Admitting: Internal Medicine

## 2015-02-12 ENCOUNTER — Telehealth: Payer: Self-pay | Admitting: Internal Medicine

## 2015-02-12 NOTE — Telephone Encounter (Signed)
Patient is calling to asdvise that she will be using silverscripts for her RX plan with the 2017 year. She states that she is telling us this because PA will be needed on CARAFATE 1 GM/10ML suspension [id number]  And albuterol (PROAIR HFA) 108 (90 BASE) MCG/ACT inhaler [id number] . She wanted to make sure that we were aware./   Ph # 1 [phone]

## 2015-03-03 ENCOUNTER — Other Ambulatory Visit: Payer: Self-pay | Admitting: Internal Medicine

## 2015-04-01 DIAGNOSIS — H401132 Primary open-angle glaucoma, bilateral, moderate stage: Secondary | ICD-10-CM | POA: Diagnosis not present

## 2015-04-17 ENCOUNTER — Other Ambulatory Visit: Payer: Self-pay | Admitting: *Deleted

## 2015-04-17 DIAGNOSIS — R0989 Other specified symptoms and signs involving the circulatory and respiratory systems: Secondary | ICD-10-CM

## 2015-04-26 ENCOUNTER — Inpatient Hospital Stay (HOSPITAL_COMMUNITY): Admission: RE | Admit: 2015-04-26 | Payer: Medicare Other | Source: Ambulatory Visit

## 2015-05-03 ENCOUNTER — Ambulatory Visit (HOSPITAL_COMMUNITY)
Admission: RE | Admit: 2015-05-03 | Discharge: 2015-05-03 | Disposition: A | Discharge status: Home / Self Care | Payer: Medicare Other | Source: Ambulatory Visit | Attending: Cardiology | Admitting: Cardiology

## 2015-05-03 DIAGNOSIS — E785 Hyperlipidemia, unspecified: Secondary | ICD-10-CM | POA: Diagnosis not present

## 2015-05-03 DIAGNOSIS — I6523 Occlusion and stenosis of bilateral carotid arteries: Secondary | ICD-10-CM | POA: Diagnosis not present

## 2015-05-03 DIAGNOSIS — R0989 Other specified symptoms and signs involving the circulatory and respiratory systems: Secondary | ICD-10-CM | POA: Diagnosis not present

## 2015-05-03 DIAGNOSIS — I10 Essential (primary) hypertension: Secondary | ICD-10-CM | POA: Insufficient documentation

## 2015-05-06 ENCOUNTER — Telehealth: Payer: Self-pay | Admitting: Internal Medicine

## 2015-05-06 NOTE — Telephone Encounter (Signed)
[facility] [facility] - Client [place] [facility]  Patient Name: [name]  DOB: July 14, 1933    Initial Comment Caller states her blood pressure is 188/90.    Nurse Assessment  Nurse: Wayne Sever, RN, Tillie Rung Date/Time (Eastern Time): 05/06/2015 10:10:43 AM  Confirm and document reason for call. If symptomatic, describe symptoms. You must click the next button to save text entered. ---Caller states she took at pharmacy and it was 188/90 and then she took it this morning and it's 1676/86. Caller is feeling good.  Has the patient traveled out of the country within the last 30 days? ---Not Applicable  Does the patient have any new or worsening symptoms? ---Yes  Will a triage be completed? ---Yes  Related visit to physician within the last 2 weeks? ---No  Does the PT have any chronic conditions? (i.e. diabetes, asthma, etc.) ---Yes  List chronic conditions. ---HTN,  Is this a behavioral health or substance abuse call? ---No     Guidelines    Guideline Title Affirmed Question Affirmed Notes  High Blood Pressure [1] Taking BP medications AND [2] feels is having side effects (e.g., impotence, cough, dizzy upon standing)    Final Disposition User   See PCP When Office is Open (within 3 days) Wayne Sever, RN, Kendra    Comments  02/21 at 1115 is when appointment was scheduled with her primary MD   Referrals  REFERRED TO PCP OFFICE   Disagree/Comply: Comply

## 2015-05-07 ENCOUNTER — Ambulatory Visit (INDEPENDENT_AMBULATORY_CARE_PROVIDER_SITE_OTHER): Payer: Medicare Other | Admitting: Internal Medicine

## 2015-05-07 ENCOUNTER — Other Ambulatory Visit (INDEPENDENT_AMBULATORY_CARE_PROVIDER_SITE_OTHER): Payer: Medicare Other

## 2015-05-07 ENCOUNTER — Encounter: Payer: Self-pay | Admitting: Internal Medicine

## 2015-05-07 VITALS — BP 168/80 | HR 56 | Wt 171.0 lb

## 2015-05-07 DIAGNOSIS — I10 Essential (primary) hypertension: Secondary | ICD-10-CM

## 2015-05-07 LAB — URINALYSIS, ROUTINE W REFLEX MICROSCOPIC
Bilirubin Urine: NEGATIVE
HGB URINE DIPSTICK: NEGATIVE
Ketones, ur: NEGATIVE
Nitrite: NEGATIVE
Specific Gravity, Urine: 1.01 (ref 1.000–1.030)
Total Protein, Urine: NEGATIVE
Urine Glucose: NEGATIVE
Urobilinogen, UA: 0.2 (ref 0.0–1.0)
pH: 6.5 (ref 5.0–8.0)

## 2015-05-07 LAB — BASIC METABOLIC PANEL
BUN: 10 mg/dL (ref 6–23)
CHLORIDE: 106 meq/L (ref 96–112)
CO2: 31 meq/L (ref 19–32)
CREATININE: 0.78 mg/dL (ref 0.40–1.20)
Calcium: 9.6 mg/dL (ref 8.4–10.5)
GFR: 91.49 mL/min (ref >60.00)
Glucose, Bld: 91 mg/dL (ref 70–99)
Potassium: 3.5 mEq/L (ref 3.5–5.1)
Sodium: 143 mEq/L (ref 135–145)

## 2015-05-07 MED ORDER — CARVEDILOL 12.5 MG PO TABS: 12.5000 mg | ORAL_TABLET | Freq: Two times a day (BID) | ORAL | Status: DC

## 2015-05-07 MED ORDER — CARVEDILOL 25 MG PO TABS: 25.0000 mg | ORAL_TABLET | Freq: Two times a day (BID) | ORAL | Status: DC

## 2015-05-07 MED ORDER — AMLODIPINE BESYLATE 10 MG PO TABS: 10.0000 mg | ORAL_TABLET | Freq: Every day | ORAL | Status: DC
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
## 2015-05-07 NOTE — Patient Instructions (Signed)
Increase Amlodipine to 10 mg/day

## 2015-05-07 NOTE — Assessment & Plan Note (Signed)
Uncontrolled Increase Amlodipine to 10 mg/d

## 2015-05-07 NOTE — Progress Notes (Signed)
Pre visit review using our clinic review tool, if applicable. No additional management support is needed unless otherwise documented below in the visit note. 

## 2015-05-08 ENCOUNTER — Encounter: Payer: Self-pay | Admitting: Internal Medicine

## 2015-05-08 ENCOUNTER — Ambulatory Visit: Payer: Self-pay | Admitting: Internal Medicine

## 2015-05-09 ENCOUNTER — Telehealth: Payer: Self-pay

## 2015-05-09 NOTE — Telephone Encounter (Signed)
PA initiated via CoverMyMeds Key [id number]

## 2015-05-10 NOTE — Telephone Encounter (Signed)
PA Approved 03/17/2015 - 05/08/2016. Pharmacy advise

## 2015-05-16 ENCOUNTER — Telehealth: Payer: Self-pay | Admitting: *Deleted

## 2015-05-16 NOTE — Telephone Encounter (Signed)
Receive call from pt she stated that she wanted to let MD know that she had to stop taking the Carvedilol. She was having some bad side effects to med blurred vision, she felt like she was going to just faint, high, she just felt terrible. Haven't taken since Tues BP been running fine on 05/14/15 am 138 79 hr (51), pm 133/67 hr (49), 05/15/15 138/70 hr (51), today 05/16/15 am 146/77 hr (51).... Inform pt MD out of office but will send him msg. Pt states she has a f/u appt on 06/03/15 will bring BP reading ...Johny Chess

## 2015-05-20 NOTE — Telephone Encounter (Signed)
Noted. thx 

## 2015-06-04 ENCOUNTER — Ambulatory Visit (INDEPENDENT_AMBULATORY_CARE_PROVIDER_SITE_OTHER): Payer: Medicare Other | Admitting: Internal Medicine

## 2015-06-04 ENCOUNTER — Encounter: Payer: Self-pay | Admitting: Internal Medicine

## 2015-06-04 VITALS — BP 140/74 | HR 53 | Wt 173.0 lb

## 2015-06-04 DIAGNOSIS — H6121 Impacted cerumen, right ear: Secondary | ICD-10-CM | POA: Diagnosis not present

## 2015-06-04 DIAGNOSIS — I10 Essential (primary) hypertension: Secondary | ICD-10-CM

## 2015-06-04 DIAGNOSIS — E785 Hyperlipidemia, unspecified: Secondary | ICD-10-CM

## 2015-06-04 DIAGNOSIS — F411 Generalized anxiety disorder: Secondary | ICD-10-CM | POA: Diagnosis not present

## 2015-06-04 NOTE — Progress Notes (Signed)
Pre visit review using our clinic review tool, if applicable. No additional management support is needed unless otherwise documented below in the visit note. 

## 2015-06-04 NOTE — Assessment & Plan Note (Signed)
Pt will irrigate at home 

## 2015-06-04 NOTE — Assessment & Plan Note (Signed)
On Zetia 

## 2015-06-04 NOTE — Assessment & Plan Note (Signed)
Losartan, Bystolic, Furosemide, Amlodipine BP Readings from Last 3 Encounters:  06/04/15 140/74  05/07/15 168/80  12/06/14 138/70

## 2015-06-04 NOTE — Progress Notes (Signed)
Subjective:  Patient ID: [name], female    DOB: 1933-05-15  Age: 82 y.o. MRN: [id number]  CC: No chief complaint on file.   HPI [name] presents for HTN f/u. C/o R ear discomfort  Outpatient Prescriptions Prior to Visit  Medication Sig Dispense Refill  . albuterol (PROAIR HFA) 108 (90 BASE) MCG/ACT inhaler Inhale 2 puffs into the lungs every 6 (six) hours as needed for wheezing or shortness of breath. 8.5 g 5  . amLODipine (NORVASC) 10 MG tablet Take 1 tablet (10 mg total) by mouth daily. 30 tablet 11  . Azelastine HCl 0.15 % SOLN Place 1-2 sprays into the nose daily. 30 mL 5  . BYSTOLIC 10 MG tablet take 2 tablets by mouth once daily 60 tablet 11  . CARAFATE 1 GM/10ML suspension take 10 milliliters (2 teaspoonfuls) by mouth twice a day 420 mL 2  . diclofenac sodium (VOLTAREN) 1 % GEL Apply 4 g topically 4 (four) times daily. 200 g 4  . dorzolamide-timolol (COSOPT) 22.3-6.8 MG/ML ophthalmic solution Place 1 drop into both eyes 3 (three) times daily.      Marland Kitchen ezetimibe (ZETIA) 10 MG tablet take 1 tablet by mouth once daily 30 tablet 11  . fexofenadine (ALLEGRA) 180 MG tablet Take 180 mg by mouth daily.      . furosemide (LASIX) 40 MG tablet take 1 tablet by mouth once daily 30 tablet 5  . losartan (COZAAR) 100 MG tablet take 1 tablet by mouth once daily 30 tablet 5  . Multiple Vitamins-Minerals (MULTIVITAMIN WITH MINERALS) tablet Take 1 tablet by mouth daily.    Marland Kitchen omeprazole (PRILOSEC) 40 MG capsule Take 1 capsule (40 mg total) by mouth daily. 30 capsule 11  . pilocarpine (PILOCAR) 4 % ophthalmic solution Place 1 drop into the right eye at bedtime.    Marland Kitchen QVAR 80 MCG/ACT inhaler Inhale 2 puffs into the lungs 2 (two) times daily.    Pauline Aus HFA 45 MCG/ACT inhaler take 1 to 2 puffs at bedtime 15 g 5  . aspirin EC 81 MG tablet Take 1 tablet (81 mg total) by mouth daily. (Patient not taking: Reported on 06/04/2015)    . carvedilol (COREG) 25 MG tablet Take 1 tablet (25 mg  total) by mouth 2 (two) times daily with a meal. (Patient not taking: Reported on 06/04/2015) 60 tablet 11   No facility-administered medications prior to visit.    ROS Review of Systems  Objective:  BP 140/74 mmHg  Pulse 53  Wt 173 lb (78.472 kg)  SpO2 97%  BP Readings from Last 3 Encounters:  06/04/15 140/74  05/07/15 168/80  12/06/14 138/70    Wt Readings from Last 3 Encounters:  06/04/15 173 lb (78.472 kg)  05/07/15 171 lb (77.565 kg)  12/06/14 173 lb (78.472 kg)    Physical Exam  Lab Results  Component Value Date   WBC 6.5 12/06/2014   HGB 13.0 12/06/2014   HCT 40.0 12/06/2014   PLT 278.0 12/06/2014   GLUCOSE 91 05/07/2015   CHOL 200 12/06/2014   TRIG 82.0 12/06/2014   HDL 58.70 12/06/2014   LDLCALC 125* 12/06/2014   ALT 11 12/06/2014   AST 18 12/06/2014   NA 143 05/07/2015   K 3.5 05/07/2015   CL 106 05/07/2015   CREATININE 0.78 05/07/2015   BUN 10 05/07/2015   CO2 31 05/07/2015   TSH 1.53 12/06/2014    No results found.  Assessment & Plan:   There are  no diagnoses linked to this encounter. I am having Ms. Arata maintain her fexofenadine, dorzolamide-timolol, QVAR, pilocarpine, multivitamin with minerals, aspirin EC, XOPENEX HFA, albuterol, Azelastine HCl, omeprazole, ezetimibe, BYSTOLIC, diclofenac sodium, CARAFATE, furosemide, losartan, carvedilol, and amLODipine.  No orders of the defined types were placed in this encounter.     Follow-up: No Follow-up on file.  Sonda Primes, MD

## 2015-06-04 NOTE — Assessment & Plan Note (Signed)
Doing better Discussed

## 2015-06-05 ENCOUNTER — Telehealth: Payer: Self-pay | Admitting: Internal Medicine

## 2015-06-05 NOTE — Telephone Encounter (Signed)
Pt would like for you to call her.  She said that her ear is still not right.

## 2015-06-07 ENCOUNTER — Ambulatory Visit (INDEPENDENT_AMBULATORY_CARE_PROVIDER_SITE_OTHER): Payer: Medicare Other | Admitting: Nurse Practitioner

## 2015-06-07 ENCOUNTER — Encounter: Payer: Self-pay | Admitting: Nurse Practitioner

## 2015-06-07 VITALS — BP 134/70 | HR 55 | Temp 97.8°F | Ht 61.0 in | Wt 172.8 lb

## 2015-06-07 DIAGNOSIS — H6121 Impacted cerumen, right ear: Secondary | ICD-10-CM | POA: Diagnosis not present

## 2015-06-07 NOTE — Assessment & Plan Note (Addendum)
Pt tried and failed irrigation at home Will perform in office  Pt tolerated well and felt improved  Sent home with AVS

## 2015-06-07 NOTE — Patient Instructions (Signed)
Lay with that ear down to the bed and use your nasal spray.   It should feel better over the next few days- if not call us Monday.

## 2015-06-07 NOTE — Progress Notes (Signed)
Patient ID: [name], female    DOB: 1933-03-18  Age: 82 y.o. MRN: [id number]  CC: Cerumen Impaction   HPI [name] presents for CC of ear fullness of left ear x 1 month.  1) Right ear- feels full and sounds are muffled she reports Feels some balance concerns  Tried to clean ear at home with OTC measures  Denies falling or other URI symptoms Feels "off"  History [name] has a past medical history of Glaucoma; Allergy, unspecified not elsewhere classified; Asthma; HTN (hypertension); Hyperlipidemia; GERD (gastroesophageal reflux disease); Diverticulosis of colon (without mention of hemorrhage); History of GI diverticular bleed; DJD (degenerative joint disease); Anxiety; Diverticulitis; LPRD (laryngopharyngeal reflux disease); Cataract; and Gastritis.   She has past surgical history that includes Total abdominal hysterectomy; Bladder repair; Kidney surgery; Knee arthroscopy (1998); and cataract surgery (2011).   Her family history includes Arthritis in her mother; Heart disease in her father; Other in her mother. There is no history of Colon cancer, Breast cancer, or Diabetes.She reports that she has never smoked. She has never used smokeless tobacco. She reports that she does not drink alcohol or use illicit drugs.  Outpatient Prescriptions Prior to Visit  Medication Sig Dispense Refill  . albuterol (PROAIR HFA) 108 (90 BASE) MCG/ACT inhaler Inhale 2 puffs into the lungs every 6 (six) hours as needed for wheezing or shortness of breath. 8.5 g 5  . amLODipine (NORVASC) 10 MG tablet Take 1 tablet (10 mg total) by mouth daily. 30 tablet 11  . aspirin EC 81 MG tablet Take 1 tablet (81 mg total) by mouth daily.    . Azelastine HCl 0.15 % SOLN Place 1-2 sprays into the nose daily. 30 mL 5  . BYSTOLIC 10 MG tablet take 2 tablets by mouth once daily 60 tablet 11  . CARAFATE 1 GM/10ML suspension take 10 milliliters (2 teaspoonfuls) by mouth twice a day 420 mL 2  . diclofenac sodium  (VOLTAREN) 1 % GEL Apply 4 g topically 4 (four) times daily. 200 g 4  . dorzolamide-timolol (COSOPT) 22.3-6.8 MG/ML ophthalmic solution Place 1 drop into both eyes 3 (three) times daily.      Marland Kitchen ezetimibe (ZETIA) 10 MG tablet take 1 tablet by mouth once daily 30 tablet 11  . fexofenadine (ALLEGRA) 180 MG tablet Take 180 mg by mouth daily.      . furosemide (LASIX) 40 MG tablet take 1 tablet by mouth once daily 30 tablet 5  . losartan (COZAAR) 100 MG tablet take 1 tablet by mouth once daily 30 tablet 5  . Multiple Vitamins-Minerals (MULTIVITAMIN WITH MINERALS) tablet Take 1 tablet by mouth daily.    Marland Kitchen omeprazole (PRILOSEC) 40 MG capsule Take 1 capsule (40 mg total) by mouth daily. 30 capsule 11  . pilocarpine (PILOCAR) 4 % ophthalmic solution Place 1 drop into the right eye at bedtime.    Marland Kitchen QVAR 80 MCG/ACT inhaler Inhale 2 puffs into the lungs 2 (two) times daily.    Pauline Aus HFA 45 MCG/ACT inhaler take 1 to 2 puffs at bedtime 15 g 5   No facility-administered medications prior to visit.    ROS Review of Systems  Constitutional: Negative for fever, chills, diaphoresis and fatigue.  HENT: Positive for hearing loss. Negative for congestion, ear discharge and ear pain.        Right ear  Respiratory: Negative for chest tightness, shortness of breath and wheezing.   Cardiovascular: Negative for chest pain, palpitations and leg swelling.  Gastrointestinal:  Negative for nausea, vomiting and diarrhea.  Skin: Negative for rash.  Neurological: Positive for light-headedness. Negative for dizziness and headaches.   Objective:  BP 134/70 mmHg  Pulse 55  Temp(Src) 97.8 F (36.6 C) (Oral)  Ht 5\' 1"  (1.549 m)  Wt 172 lb 12 oz (78.359 kg)  BMI 32.66 kg/m2  SpO2 93%  Physical Exam  Constitutional: She is oriented to person, place, and time. She appears well-developed and well-nourished. No distress.  HENT:  Head: Normocephalic and atraumatic.  Right Ear: External ear normal.  Left Ear: External  ear normal.  Mouth/Throat: Oropharynx is clear and moist. No oropharyngeal exudate.  Right TM blocked by wet cerumen  TM clear after removal of cerumen  Eyes: EOM are normal. Pupils are equal, round, and reactive to light. Right eye exhibits no discharge. Left eye exhibits no discharge. No scleral icterus.  Neck: Normal range of motion. Neck supple.  Cardiovascular: Normal rate and regular rhythm.   Pulmonary/Chest: Effort normal and breath sounds normal. No respiratory distress. She has no wheezes. She has no rales. She exhibits no tenderness.  Lymphadenopathy:    She has no cervical adenopathy.  Neurological: She is alert and oriented to person, place, and time.  Skin: Skin is warm and dry. No rash noted. She is not diaphoretic.  Psychiatric: She has a normal mood and affect. Her behavior is normal. Judgment and thought content normal.   Assessment & Plan:   [name] was seen today for cerumen impaction.  Diagnoses and all orders for this visit:  Impacted cerumen of right ear   I am having [name] maintain her fexofenadine, dorzolamide-timolol, QVAR, pilocarpine, multivitamin with minerals, aspirin EC, XOPENEX HFA, albuterol, Azelastine HCl, omeprazole, ezetimibe, BYSTOLIC, diclofenac sodium, CARAFATE, furosemide, losartan, and amLODipine.  No orders of the defined types were placed in this encounter.     Follow-up: Return if symptoms worsen or fail to improve.

## 2015-06-07 NOTE — Progress Notes (Signed)
Pre visit review using our clinic review tool, if applicable. No additional management support is needed unless otherwise documented below in the visit note. 

## 2015-06-07 NOTE — Telephone Encounter (Signed)
Left mess for patient to call back.  

## 2015-06-18 NOTE — Telephone Encounter (Signed)
I called pt's daughter. She states pt is fine and nothing further is needed from [place] at this time. Closing phone note.

## 2015-06-20 ENCOUNTER — Other Ambulatory Visit: Payer: Self-pay

## 2015-06-20 MED ORDER — SUCRALFATE 1 GM/10ML PO SUSP: ORAL | Status: DC

## 2015-06-26 DIAGNOSIS — J301 Allergic rhinitis due to pollen: Secondary | ICD-10-CM | POA: Diagnosis not present

## 2015-06-26 DIAGNOSIS — J3089 Other allergic rhinitis: Secondary | ICD-10-CM | POA: Diagnosis not present

## 2015-06-26 DIAGNOSIS — J4530 Mild persistent asthma, uncomplicated: Secondary | ICD-10-CM | POA: Diagnosis not present

## 2015-07-25 ENCOUNTER — Other Ambulatory Visit: Payer: Self-pay | Admitting: *Deleted

## 2015-07-25 MED ORDER — OMEPRAZOLE 40 MG PO CPDR: 40.0000 mg | DELAYED_RELEASE_CAPSULE | Freq: Every day | ORAL | Status: DC

## 2015-08-01 DIAGNOSIS — H401132 Primary open-angle glaucoma, bilateral, moderate stage: Secondary | ICD-10-CM | POA: Diagnosis not present

## 2015-08-26 ENCOUNTER — Other Ambulatory Visit: Payer: Self-pay | Admitting: *Deleted

## 2015-08-26 MED ORDER — AMLODIPINE BESYLATE 10 MG PO TABS: 10.0000 mg | ORAL_TABLET | Freq: Every day | ORAL | Status: DC

## 2015-08-29 ENCOUNTER — Other Ambulatory Visit: Payer: Self-pay | Admitting: *Deleted

## 2015-08-29 MED ORDER — EZETIMIBE 10 MG PO TABS: ORAL_TABLET | ORAL | Status: DC

## 2015-09-05 ENCOUNTER — Encounter: Payer: Self-pay | Admitting: Internal Medicine

## 2015-09-05 ENCOUNTER — Ambulatory Visit (INDEPENDENT_AMBULATORY_CARE_PROVIDER_SITE_OTHER): Payer: Medicare Other | Admitting: Internal Medicine

## 2015-09-05 VITALS — BP 148/66 | HR 54 | Wt 173.0 lb

## 2015-09-05 DIAGNOSIS — Z23 Encounter for immunization: Secondary | ICD-10-CM

## 2015-09-05 DIAGNOSIS — K222 Esophageal obstruction: Secondary | ICD-10-CM

## 2015-09-05 DIAGNOSIS — J4520 Mild intermittent asthma, uncomplicated: Secondary | ICD-10-CM | POA: Diagnosis not present

## 2015-09-05 DIAGNOSIS — F411 Generalized anxiety disorder: Secondary | ICD-10-CM | POA: Diagnosis not present

## 2015-09-05 DIAGNOSIS — E785 Hyperlipidemia, unspecified: Secondary | ICD-10-CM

## 2015-09-05 DIAGNOSIS — K219 Gastro-esophageal reflux disease without esophagitis: Secondary | ICD-10-CM

## 2015-09-05 DIAGNOSIS — I10 Essential (primary) hypertension: Secondary | ICD-10-CM

## 2015-09-05 DIAGNOSIS — Z0000 Encounter for general adult medical examination without abnormal findings: Secondary | ICD-10-CM

## 2015-09-05 NOTE — Progress Notes (Signed)
Pre visit review using our clinic review tool, if applicable. No additional management support is needed unless otherwise documented below in the visit note. 

## 2015-09-05 NOTE — Assessment & Plan Note (Signed)
Doing well 

## 2015-09-05 NOTE — Assessment & Plan Note (Signed)
On Omeprazole 

## 2015-09-05 NOTE — Assessment & Plan Note (Signed)
Labs

## 2015-09-05 NOTE — Progress Notes (Signed)
Subjective:  Patient ID: [name], female    DOB: 1933-09-14  Age: 82 y.o. MRN: [id number]  CC: No chief complaint on file.   HPI [name] presents for HTN, asthma, GERD, OA f/u.  BP is nl at home  Outpatient Prescriptions Prior to Visit  Medication Sig Dispense Refill  . albuterol (PROAIR HFA) 108 (90 BASE) MCG/ACT inhaler Inhale 2 puffs into the lungs every 6 (six) hours as needed for wheezing or shortness of breath. 8.5 g 5  . amLODipine (NORVASC) 10 MG tablet Take 1 tablet (10 mg total) by mouth daily. 90 tablet 2  . aspirin EC 81 MG tablet Take 1 tablet (81 mg total) by mouth daily.    . Azelastine HCl 0.15 % SOLN Place 1-2 sprays into the nose daily. 30 mL 5  . BYSTOLIC 10 MG tablet take 2 tablets by mouth once daily 60 tablet 11  . diclofenac sodium (VOLTAREN) 1 % GEL Apply 4 g topically 4 (four) times daily. 200 g 4  . dorzolamide-timolol (COSOPT) 22.3-6.8 MG/ML ophthalmic solution Place 1 drop into both eyes 3 (three) times daily.      Marland Kitchen ezetimibe (ZETIA) 10 MG tablet take 1 tablet by mouth once daily 30 tablet 11  . fexofenadine (ALLEGRA) 180 MG tablet Take 180 mg by mouth daily.      . furosemide (LASIX) 40 MG tablet take 1 tablet by mouth once daily 30 tablet 5  . losartan (COZAAR) 100 MG tablet take 1 tablet by mouth once daily 30 tablet 5  . Multiple Vitamins-Minerals (MULTIVITAMIN WITH MINERALS) tablet Take 1 tablet by mouth daily.    Marland Kitchen omeprazole (PRILOSEC) 40 MG capsule Take 1 capsule (40 mg total) by mouth daily. 30 capsule 11  . QVAR 80 MCG/ACT inhaler Inhale 2 puffs into the lungs 2 (two) times daily.    . sucralfate (CARAFATE) 1 GM/10ML suspension take 10 milliliters (2 teaspoonfuls) by mouth twice a day 420 mL 2  . XOPENEX HFA 45 MCG/ACT inhaler take 1 to 2 puffs at bedtime 15 g 5  . pilocarpine (PILOCAR) 4 % ophthalmic solution Place 1 drop into the right eye at bedtime. Reported on 09/05/2015     No facility-administered medications prior to  visit.    ROS Review of Systems  Constitutional: Negative for chills, activity change, appetite change, fatigue and unexpected weight change.  HENT: Negative for congestion, mouth sores and sinus pressure.   Eyes: Negative for visual disturbance.  Respiratory: Negative for cough and chest tightness.   Gastrointestinal: Negative for nausea and abdominal pain.  Genitourinary: Negative for frequency, difficulty urinating and vaginal pain.  Musculoskeletal: Positive for arthralgias. Negative for back pain and gait problem.  Skin: Negative for pallor and rash.  Neurological: Negative for dizziness, tremors, weakness, numbness and headaches.  Psychiatric/Behavioral: Negative for confusion and sleep disturbance.    Objective:  BP 148/66 mmHg  Pulse 54  Wt 173 lb (78.472 kg)  SpO2 97%  BP Readings from Last 3 Encounters:  09/05/15 148/66  06/07/15 134/70  06/04/15 140/74    Wt Readings from Last 3 Encounters:  09/05/15 173 lb (78.472 kg)  06/07/15 172 lb 12 oz (78.359 kg)  06/04/15 173 lb (78.472 kg)    Physical Exam  Constitutional: She appears well-developed. No distress.  HENT:  Head: Normocephalic.  Right Ear: External ear normal.  Left Ear: External ear normal.  Nose: Nose normal.  Mouth/Throat: Oropharynx is clear and moist.  Eyes: Conjunctivae are normal.  Pupils are equal, round, and reactive to light. Right eye exhibits no discharge. Left eye exhibits no discharge.  Neck: Normal range of motion. Neck supple. No JVD present. No tracheal deviation present. No thyromegaly present.  Cardiovascular: Normal rate, regular rhythm and normal heart sounds.   Pulmonary/Chest: No stridor. No respiratory distress. She has no wheezes.  Abdominal: Soft. Bowel sounds are normal. She exhibits no distension and no mass. There is no tenderness. There is no rebound and no guarding.  Musculoskeletal: She exhibits no edema or tenderness.  Lymphadenopathy:    She has no cervical  adenopathy.  Neurological: She displays normal reflexes. No cranial nerve deficit. She exhibits normal muscle tone. Coordination normal.  Skin: No rash noted. No erythema.  Psychiatric: She has a normal mood and affect. Her behavior is normal. Judgment and thought content normal.  no wax  Lab Results  Component Value Date   WBC 6.5 12/06/2014   HGB 13.0 12/06/2014   HCT 40.0 12/06/2014   PLT 278.0 12/06/2014   GLUCOSE 91 05/07/2015   CHOL 200 12/06/2014   TRIG 82.0 12/06/2014   HDL 58.70 12/06/2014   LDLCALC 125* 12/06/2014   ALT 11 12/06/2014   AST 18 12/06/2014   NA 143 05/07/2015   K 3.5 05/07/2015   CL 106 05/07/2015   CREATININE 0.78 05/07/2015   BUN 10 05/07/2015   CO2 31 05/07/2015   TSH 1.53 12/06/2014    No results found.  Assessment & Plan:   There are no diagnoses linked to this encounter. I am having Ms. Feasel maintain her fexofenadine, dorzolamide-timolol, QVAR, pilocarpine, multivitamin with minerals, aspirin EC, XOPENEX HFA, albuterol, Azelastine HCl, BYSTOLIC, diclofenac sodium, furosemide, losartan, sucralfate, omeprazole, amLODipine, and ezetimibe.  No orders of the defined types were placed in this encounter.     Follow-up: No Follow-up on file.  Sonda Primes, MD

## 2015-09-05 NOTE — Assessment & Plan Note (Signed)
Nl BP at home Losartan, Bystolic, Furosemide, Amlodipine

## 2015-09-05 NOTE — Assessment & Plan Note (Signed)
Chronic  Xopenex prn, Qvar

## 2015-09-24 ENCOUNTER — Other Ambulatory Visit: Payer: Self-pay | Admitting: *Deleted

## 2015-09-24 MED ORDER — AMLODIPINE BESYLATE 10 MG PO TABS: 10.0000 mg | ORAL_TABLET | Freq: Every day | ORAL | Status: DC

## 2015-10-10 ENCOUNTER — Other Ambulatory Visit: Payer: Self-pay | Admitting: Internal Medicine

## 2015-10-23 ENCOUNTER — Other Ambulatory Visit: Payer: Self-pay | Admitting: Internal Medicine

## 2015-11-07 DIAGNOSIS — Z23 Encounter for immunization: Secondary | ICD-10-CM | POA: Diagnosis not present

## 2015-11-14 DIAGNOSIS — H401132 Primary open-angle glaucoma, bilateral, moderate stage: Secondary | ICD-10-CM | POA: Diagnosis not present

## 2015-11-14 DIAGNOSIS — H52203 Unspecified astigmatism, bilateral: Secondary | ICD-10-CM | POA: Diagnosis not present

## 2015-11-14 DIAGNOSIS — Z961 Presence of intraocular lens: Secondary | ICD-10-CM | POA: Diagnosis not present

## 2015-11-14 DIAGNOSIS — H524 Presbyopia: Secondary | ICD-10-CM | POA: Diagnosis not present

## 2015-11-22 ENCOUNTER — Other Ambulatory Visit: Payer: Self-pay | Admitting: Internal Medicine

## 2015-12-06 DIAGNOSIS — Z1231 Encounter for screening mammogram for malignant neoplasm of breast: Secondary | ICD-10-CM | POA: Diagnosis not present

## 2015-12-23 DIAGNOSIS — H401132 Primary open-angle glaucoma, bilateral, moderate stage: Secondary | ICD-10-CM | POA: Diagnosis not present

## 2015-12-23 IMAGING — CT CT HEAD W/O CM
1 series · 16 of 27 positions shown, 20 images · non-contrast
Comparison: None.

CLINICAL DATA: Headache, near syncope.

EXAM:
CT HEAD WITHOUT CONTRAST
TECHNIQUE: Contiguous axial images were obtained from the base of the skull
through the vertex without intravenous contrast.

[Series 2: head 5.0 h30s · axial · 0.42mm/px · z∈[+561,+681]mm · 16 of 27 slices shown, 20 images]
[im 2/27  brain]
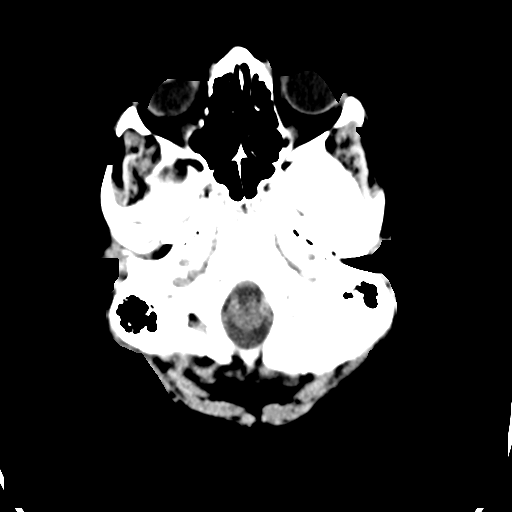
[im 2/27  bone]
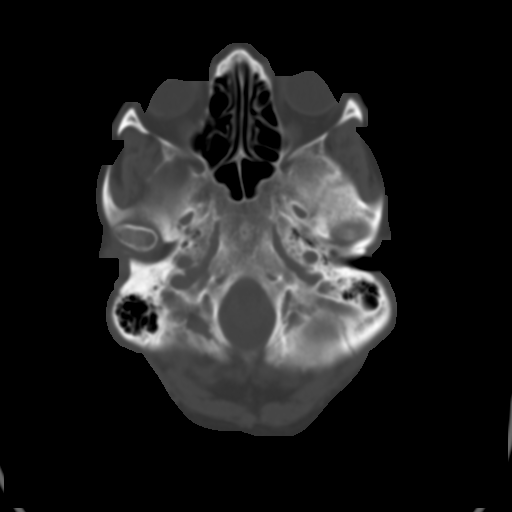
[im 4/27  brain]
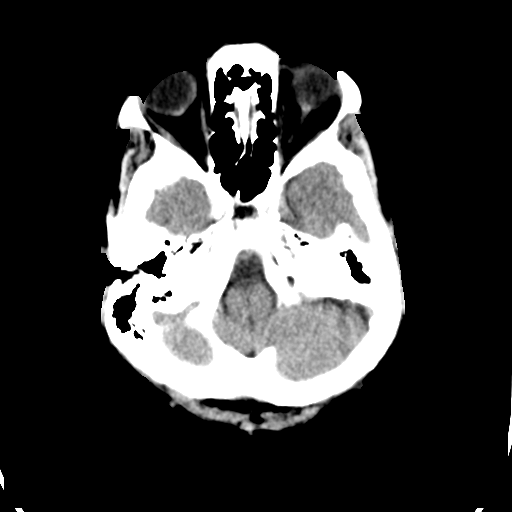
[im 5/27  brain]
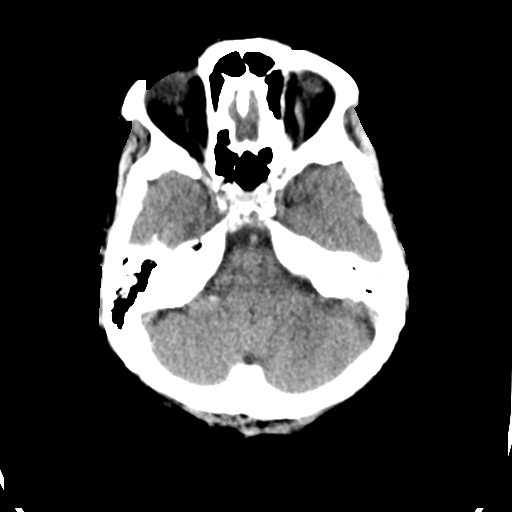
[im 7/27  brain]
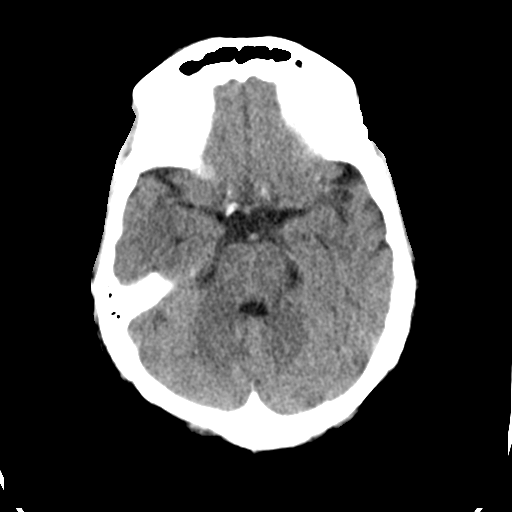
[im 9/27  brain]
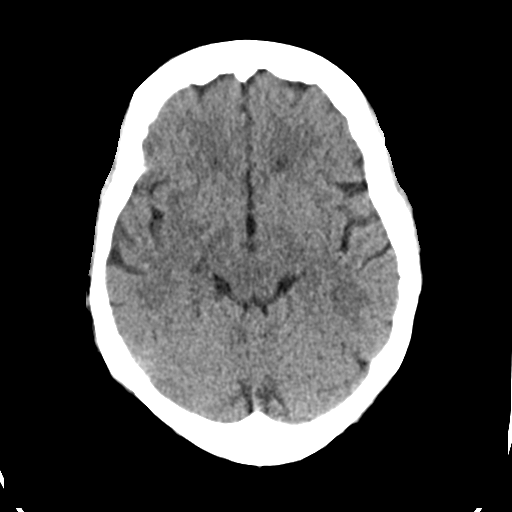
[im 9/27  bone]
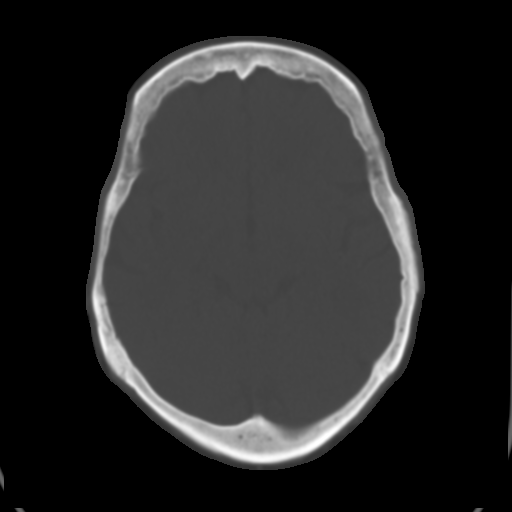
[im 10/27  brain]
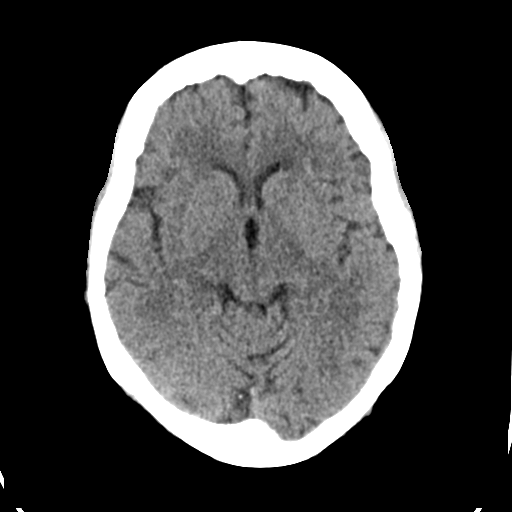
[im 12/27  brain]
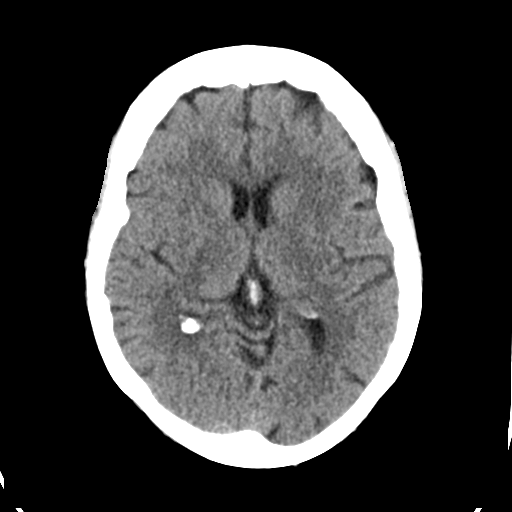
[im 13/27  brain]
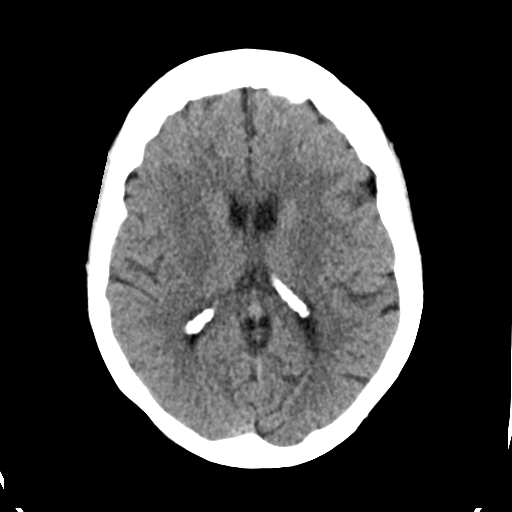
[im 15/27  brain]
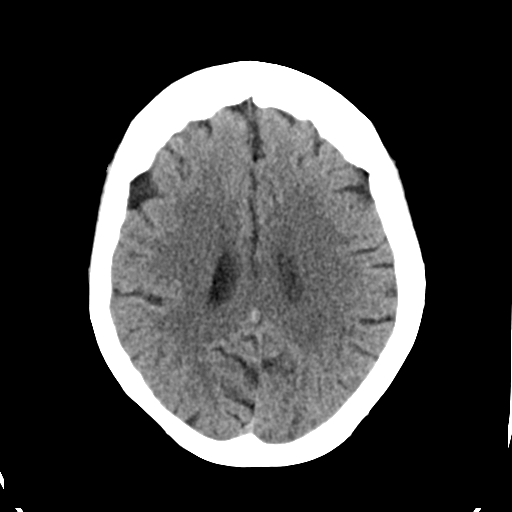
[im 15/27  bone]
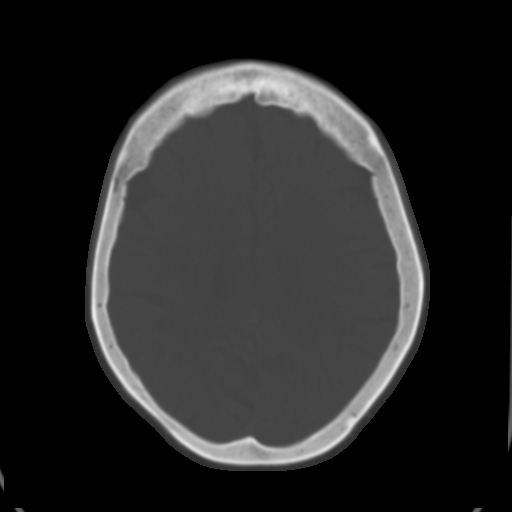
[im 16/27  brain]
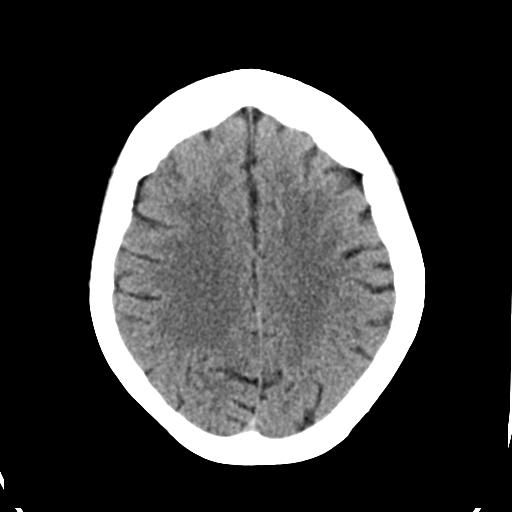
[im 18/27  brain]
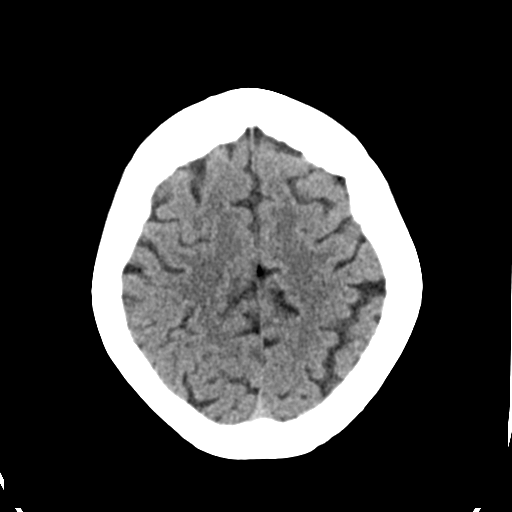
[im 19/27  brain]
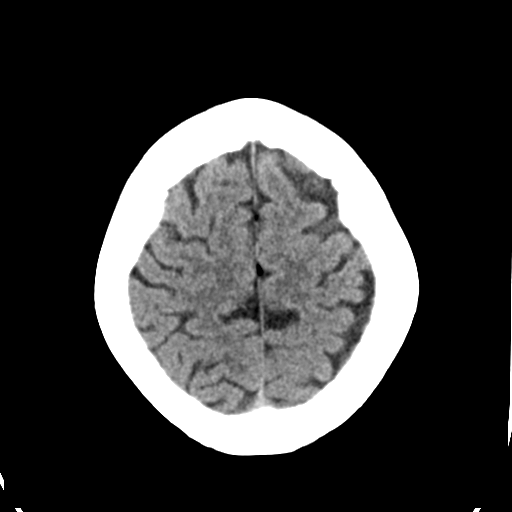
[im 21/27  brain]
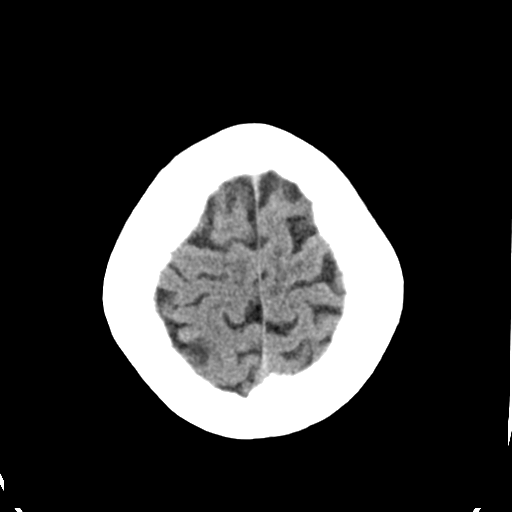
[im 21/27  bone]
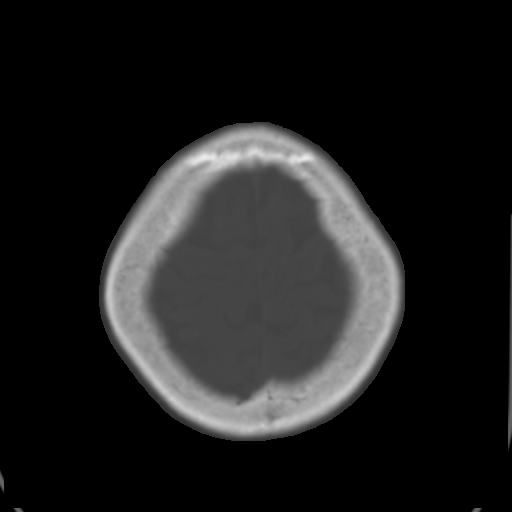
[im 23/27  brain]
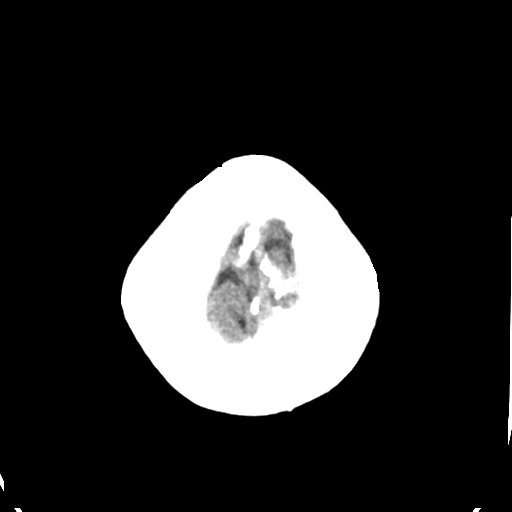
[im 24/27  brain]
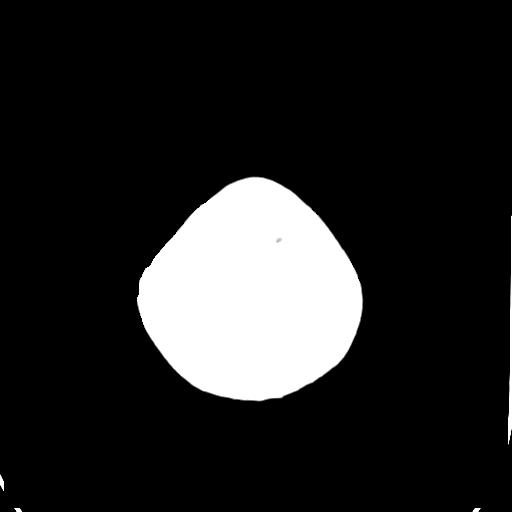
[im 26/27  brain]
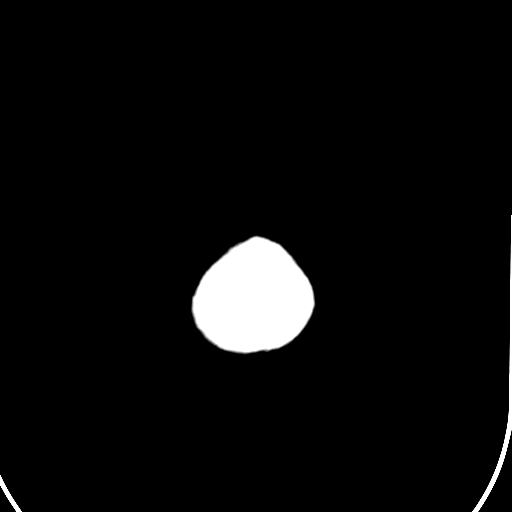

[16 of 27 positions shown; findings below may reference images not displayed]

FINDINGS: Ventricles, cisterns and other CSF spaces are within normal. There
is no focal mass, mass effect, shift of midline structures or acute
hemorrhage. There is no evidence to suggest acute infarction.
Remaining bones and soft tissues are unremarkable.
IMPRESSION: No acute intracranial findings.

## 2016-01-03 ENCOUNTER — Other Ambulatory Visit (INDEPENDENT_AMBULATORY_CARE_PROVIDER_SITE_OTHER): Payer: Medicare Other

## 2016-01-03 DIAGNOSIS — J4520 Mild intermittent asthma, uncomplicated: Secondary | ICD-10-CM | POA: Diagnosis not present

## 2016-01-03 DIAGNOSIS — I10 Essential (primary) hypertension: Secondary | ICD-10-CM | POA: Diagnosis not present

## 2016-01-03 DIAGNOSIS — Z0000 Encounter for general adult medical examination without abnormal findings: Secondary | ICD-10-CM

## 2016-01-03 DIAGNOSIS — F411 Generalized anxiety disorder: Secondary | ICD-10-CM

## 2016-01-03 DIAGNOSIS — K222 Esophageal obstruction: Secondary | ICD-10-CM

## 2016-01-03 DIAGNOSIS — E785 Hyperlipidemia, unspecified: Secondary | ICD-10-CM

## 2016-01-03 DIAGNOSIS — K219 Gastro-esophageal reflux disease without esophagitis: Secondary | ICD-10-CM | POA: Diagnosis not present

## 2016-01-03 LAB — URINALYSIS, ROUTINE W REFLEX MICROSCOPIC
Bilirubin Urine: NEGATIVE
HGB URINE DIPSTICK: NEGATIVE
Ketones, ur: NEGATIVE
Nitrite: NEGATIVE
PH: 6 (ref 5.0–8.0)
Specific Gravity, Urine: <=1.005 — AB (ref 1.000–1.030)
TOTAL PROTEIN, URINE-UPE24: NEGATIVE
Urine Glucose: NEGATIVE
Urobilinogen, UA: 0.2 (ref 0.0–1.0)

## 2016-01-03 LAB — LIPID PANEL
Cholesterol: 178 mg/dL (ref 0–200)
HDL: 52.8 mg/dL (ref >39.00)
LDL CALC: 109 mg/dL — AB (ref 0–99)
NONHDL: 125.29
Total CHOL/HDL Ratio: 3
Triglycerides: 82 mg/dL (ref 0.0–149.0)
VLDL: 16.4 mg/dL (ref 0.0–40.0)

## 2016-01-03 LAB — BASIC METABOLIC PANEL
BUN: 10 mg/dL (ref 6–23)
CALCIUM: 9.6 mg/dL (ref 8.4–10.5)
CO2: 30 mEq/L (ref 19–32)
Chloride: 109 mEq/L (ref 96–112)
Creatinine, Ser: 0.83 mg/dL (ref 0.40–1.20)
GFR: 85.02 mL/min (ref >60.00)
GLUCOSE: 87 mg/dL (ref 70–99)
Potassium: 4.2 mEq/L (ref 3.5–5.1)
SODIUM: 144 meq/L (ref 135–145)

## 2016-01-03 LAB — CBC WITH DIFFERENTIAL/PLATELET
BASOS ABS: 0 10*3/uL (ref 0.0–0.1)
Basophils Relative: 0.5 % (ref 0.0–3.0)
EOS ABS: 0.5 10*3/uL (ref 0.0–0.7)
Eosinophils Relative: 8.2 % — ABNORMAL HIGH (ref 0.0–5.0)
HCT: 37 % (ref 36.0–46.0)
Hemoglobin: 12.1 g/dL (ref 12.0–15.0)
LYMPHS ABS: 2.3 10*3/uL (ref 0.7–4.0)
Lymphocytes Relative: 34.7 % (ref 12.0–46.0)
MCHC: 32.7 g/dL (ref 30.0–36.0)
MCV: 83.3 fl (ref 78.0–100.0)
MONO ABS: 0.5 10*3/uL (ref 0.1–1.0)
MONOS PCT: 6.9 % (ref 3.0–12.0)
NEUTROS PCT: 49.7 % (ref 43.0–77.0)
Neutro Abs: 3.2 10*3/uL (ref 1.4–7.7)
Platelets: 294 10*3/uL (ref 150.0–400.0)
RBC: 4.44 Mil/uL (ref 3.87–5.11)
RDW: 14.3 % (ref 11.5–15.5)
WBC: 6.5 10*3/uL (ref 4.0–10.5)

## 2016-01-03 LAB — HEPATIC FUNCTION PANEL
ALK PHOS: 83 U/L (ref 39–117)
ALT: 8 U/L (ref 0–35)
AST: 14 U/L (ref 0–37)
Albumin: 3.9 g/dL (ref 3.5–5.2)
BILIRUBIN DIRECT: 0.1 mg/dL (ref 0.0–0.3)
BILIRUBIN TOTAL: 0.4 mg/dL (ref 0.2–1.2)
Total Protein: 7.1 g/dL (ref 6.0–8.3)

## 2016-01-03 LAB — TSH: TSH: 1.47 u[IU]/mL (ref 0.35–4.50)

## 2016-01-09 ENCOUNTER — Ambulatory Visit (INDEPENDENT_AMBULATORY_CARE_PROVIDER_SITE_OTHER): Payer: Medicare Other | Admitting: Internal Medicine

## 2016-01-09 ENCOUNTER — Encounter: Payer: Self-pay | Admitting: Internal Medicine

## 2016-01-09 DIAGNOSIS — J45909 Unspecified asthma, uncomplicated: Secondary | ICD-10-CM

## 2016-01-09 DIAGNOSIS — I10 Essential (primary) hypertension: Secondary | ICD-10-CM | POA: Diagnosis not present

## 2016-01-09 DIAGNOSIS — Z23 Encounter for immunization: Secondary | ICD-10-CM | POA: Diagnosis not present

## 2016-01-09 DIAGNOSIS — E785 Hyperlipidemia, unspecified: Secondary | ICD-10-CM

## 2016-01-09 NOTE — Progress Notes (Signed)
Pre visit review using our clinic review tool, if applicable. No additional management support is needed unless otherwise documented below in the visit note. 

## 2016-01-09 NOTE — Progress Notes (Signed)
Subjective:  Patient ID: [name], female    DOB: 02-03-1934  Age: 82 y.o. MRN: [id number]  CC: No chief complaint on file.   HPI MICHAELINA TAMBURO presents for HTN, asthma, dyslipidemia f/u  Outpatient Medications Prior to Visit  Medication Sig Dispense Refill  . albuterol (PROAIR HFA) 108 (90 BASE) MCG/ACT inhaler Inhale 2 puffs into the lungs every 6 (six) hours as needed for wheezing or shortness of breath. 8.5 g 5  . amLODipine (NORVASC) 10 MG tablet Take 1 tablet (10 mg total) by mouth daily. 90 tablet 2  . amLODipine (NORVASC) 5 MG tablet take 1 tablet by mouth once daily 30 tablet 5  . aspirin EC 81 MG tablet Take 1 tablet (81 mg total) by mouth daily.    . Azelastine HCl 0.15 % SOLN Place 1-2 sprays into the nose daily. 30 mL 5  . BYSTOLIC 10 MG tablet take 2 tablets by mouth once daily 60 tablet 11  . dorzolamide-timolol (COSOPT) 22.3-6.8 MG/ML ophthalmic solution Place 1 drop into both eyes 3 (three) times daily.      Marland Kitchen ezetimibe (ZETIA) 10 MG tablet take 1 tablet by mouth once daily 30 tablet 11  . fexofenadine (ALLEGRA) 180 MG tablet Take 180 mg by mouth daily.      . furosemide (LASIX) 40 MG tablet take 1 tablet by mouth once daily 30 tablet 5  . losartan (COZAAR) 100 MG tablet take 1 tablet by mouth once daily 30 tablet 5  . Multiple Vitamins-Minerals (MULTIVITAMIN WITH MINERALS) tablet Take 1 tablet by mouth daily.    Marland Kitchen omeprazole (PRILOSEC) 40 MG capsule Take 1 capsule (40 mg total) by mouth daily. 30 capsule 11  . pilocarpine (PILOCAR) 4 % ophthalmic solution Place 1 drop into the right eye at bedtime. Reported on 09/05/2015    . QVAR 80 MCG/ACT inhaler Inhale 2 puffs into the lungs 2 (two) times daily.    . sucralfate (CARAFATE) 1 GM/10ML suspension take 10 milliliters (2 teaspoonfuls) by mouth twice a day 420 mL 2  . XOPENEX HFA 45 MCG/ACT inhaler take 1 to 2 puffs at bedtime 15 g 5  . diclofenac sodium (VOLTAREN) 1 % GEL Apply 4 g topically 4 (four) times  daily. (Patient not taking: Reported on 01/09/2016) 200 g 4   No facility-administered medications prior to visit.     ROS Review of Systems  Constitutional: Negative for activity change, appetite change, chills, fatigue and unexpected weight change.  HENT: Negative for congestion, mouth sores and sinus pressure.   Eyes: Negative for visual disturbance.  Respiratory: Negative for cough and chest tightness.   Gastrointestinal: Negative for abdominal pain and nausea.  Genitourinary: Negative for difficulty urinating, frequency and vaginal pain.  Musculoskeletal: Negative for back pain and gait problem.  Skin: Negative for pallor and rash.  Neurological: Negative for dizziness, tremors, weakness, numbness and headaches.  Psychiatric/Behavioral: Negative for confusion and sleep disturbance.    Objective:  BP (!) 150/80   Pulse (!) 59   Temp 98 F (36.7 C) (Oral)   Wt 172 lb (78 kg)   SpO2 97%   BMI 32.50 kg/m   BP Readings from Last 3 Encounters:  01/09/16 (!) 150/80  09/05/15 (!) 148/66  06/07/15 134/70    Wt Readings from Last 3 Encounters:  01/09/16 172 lb (78 kg)  09/05/15 173 lb (78.5 kg)  06/07/15 172 lb 12 oz (78.4 kg)    Physical Exam  Constitutional: She appears well-developed. No  distress.  HENT:  Head: Normocephalic.  Right Ear: External ear normal.  Left Ear: External ear normal.  Nose: Nose normal.  Mouth/Throat: Oropharynx is clear and moist.  Eyes: Conjunctivae are normal. Pupils are equal, round, and reactive to light. Right eye exhibits no discharge. Left eye exhibits no discharge.  Neck: Normal range of motion. Neck supple. No JVD present. No tracheal deviation present. No thyromegaly present.  Cardiovascular: Normal rate, regular rhythm and normal heart sounds.   Pulmonary/Chest: No stridor. No respiratory distress. She has no wheezes.  Abdominal: Soft. Bowel sounds are normal. She exhibits no distension and no mass. There is no tenderness. There is  no rebound and no guarding.  Musculoskeletal: She exhibits no edema or tenderness.  Lymphadenopathy:    She has no cervical adenopathy.  Neurological: She displays normal reflexes. No cranial nerve deficit. She exhibits normal muscle tone. Coordination normal.  Skin: No rash noted. No erythema.  Psychiatric: She has a normal mood and affect. Her behavior is normal. Judgment and thought content normal.    Lab Results  Component Value Date   WBC 6.5 01/03/2016   HGB 12.1 01/03/2016   HCT 37.0 01/03/2016   PLT 294.0 01/03/2016   GLUCOSE 87 01/03/2016   CHOL 178 01/03/2016   TRIG 82.0 01/03/2016   HDL 52.80 01/03/2016   LDLCALC 109 (H) 01/03/2016   ALT 8 01/03/2016   AST 14 01/03/2016   NA 144 01/03/2016   K 4.2 01/03/2016   CL 109 01/03/2016   CREATININE 0.83 01/03/2016   BUN 10 01/03/2016   CO2 30 01/03/2016   TSH 1.47 01/03/2016    No results found.  Assessment & Plan:   There are no diagnoses linked to this encounter. I am having Ms. Lindsley maintain her fexofenadine, dorzolamide-timolol, QVAR, pilocarpine, multivitamin with minerals, aspirin EC, XOPENEX HFA, albuterol, Azelastine HCl, diclofenac sodium, furosemide, sucralfate, omeprazole, ezetimibe, amLODipine, losartan, BYSTOLIC, amLODipine, dorzolamide-timolol, and pilocarpine.  Meds ordered this encounter  Medications  . dorzolamide-timolol (COSOPT) 22.3-6.8 MG/ML ophthalmic solution    Sig: Place 1 drop into both eyes 3 (three) times daily.  . pilocarpine (PILOCAR) 4 % ophthalmic solution    Sig: Place 1 drop into the right eye at bedtime.     Follow-up: No Follow-up on file.  Sonda Primes, MD

## 2016-01-09 NOTE — Assessment & Plan Note (Signed)
On Zetia 

## 2016-01-09 NOTE — Assessment & Plan Note (Signed)
Xopenex prn, Qvar 

## 2016-01-09 NOTE — Assessment & Plan Note (Signed)
Nl BP at home and when re-checked here Losartan, Bystolic, Furosemide, Amlodipine 5 mg

## 2016-01-09 NOTE — Addendum Note (Signed)
Addended by: Cresenciano Lick on: 01/09/2016 11:52 AM   Modules accepted: Orders

## 2016-02-17 DIAGNOSIS — H401134 Primary open-angle glaucoma, bilateral, indeterminate stage: Secondary | ICD-10-CM | POA: Diagnosis not present

## 2016-03-21 ENCOUNTER — Other Ambulatory Visit: Payer: Self-pay | Admitting: Internal Medicine

## 2016-03-24 ENCOUNTER — Other Ambulatory Visit: Payer: Self-pay | Admitting: Internal Medicine

## 2016-04-03 ENCOUNTER — Telehealth: Payer: Self-pay | Admitting: *Deleted

## 2016-04-03 MED ORDER — NEBIVOLOL HCL 20 MG PO TABS: 1.0000 | ORAL_TABLET | Freq: Every day | ORAL | 5 refills | Status: DC

## 2016-04-03 NOTE — Telephone Encounter (Signed)
Rec'd call pt states she received letter from insurance stating she can not received more than 30 tablets on the Bystolic 10 mg. Pt states she takes 2 tablet a day. Gave me # [phone]. Called # its actually [facility]. Pt will start getting med through mail service. Will change to a 20 mg tablet and to take 1 pill day. Called pt back to let her know status on medication...Johny Chess

## 2016-04-28 ENCOUNTER — Other Ambulatory Visit: Payer: Self-pay | Admitting: Internal Medicine

## 2016-05-01 ENCOUNTER — Other Ambulatory Visit: Payer: Self-pay | Admitting: Internal Medicine

## 2016-05-05 ENCOUNTER — Telehealth: Payer: Self-pay

## 2016-05-05 MED ORDER — HYDROCODONE-HOMATROPINE 5-1.5 MG/5ML PO SYRP: ORAL_SOLUTION | ORAL | 0 refills | Status: DC

## 2016-05-05 NOTE — Telephone Encounter (Signed)
MD printed script from home & it did not print. Reprinted and called pt inform rx is ready for pick-up...[name]

## 2016-05-05 NOTE — Addendum Note (Signed)
Addended by: Earnstine Regal on: 05/05/2016 11:56 AM   Modules accepted: Orders

## 2016-05-05 NOTE — Telephone Encounter (Signed)
Spoke with patient and notified her of Rx ready to be picked up in the office

## 2016-05-21 ENCOUNTER — Telehealth: Payer: Self-pay

## 2016-05-21 DIAGNOSIS — H401132 Primary open-angle glaucoma, bilateral, moderate stage: Secondary | ICD-10-CM | POA: Diagnosis not present

## 2016-05-21 NOTE — Telephone Encounter (Signed)
She is on 20 mg/d - needs to be on 20 mg/d Thx

## 2016-05-21 NOTE — Telephone Encounter (Signed)
PA for Bystolic started KEY: [id number]

## 2016-05-21 NOTE — Telephone Encounter (Signed)
PA for bystolic approved for two 10mg  tablets daily, please advise if there is a different recommendation or discrepancy  Patient and pharmacy contacted

## 2016-06-25 DIAGNOSIS — J4530 Mild persistent asthma, uncomplicated: Secondary | ICD-10-CM | POA: Diagnosis not present

## 2016-06-25 DIAGNOSIS — J301 Allergic rhinitis due to pollen: Secondary | ICD-10-CM | POA: Diagnosis not present

## 2016-06-25 DIAGNOSIS — J3089 Other allergic rhinitis: Secondary | ICD-10-CM | POA: Diagnosis not present

## 2016-07-07 ENCOUNTER — Telehealth: Payer: Self-pay | Admitting: Internal Medicine

## 2016-07-07 NOTE — Telephone Encounter (Signed)
Pt called regarding her CARAFATE 1 GM/10ML suspension  [facility] faxed over paperwork stating this medication will no longer be covered by her insurance.   Have you received any paperwork concerning this Pts CARAFATE 1 GM/10ML suspension

## 2016-07-08 NOTE — Telephone Encounter (Signed)
LMTCB, have not received any paperwork.  Will have to start PA and will guarantee to be covered or I can see if Dr. Alain Marion recommends anything else.

## 2016-07-09 ENCOUNTER — Ambulatory Visit (INDEPENDENT_AMBULATORY_CARE_PROVIDER_SITE_OTHER): Payer: Medicare Other | Admitting: Internal Medicine

## 2016-07-09 ENCOUNTER — Encounter: Payer: Self-pay | Admitting: Internal Medicine

## 2016-07-09 ENCOUNTER — Other Ambulatory Visit (INDEPENDENT_AMBULATORY_CARE_PROVIDER_SITE_OTHER): Payer: Medicare Other

## 2016-07-09 DIAGNOSIS — E785 Hyperlipidemia, unspecified: Secondary | ICD-10-CM | POA: Diagnosis not present

## 2016-07-09 DIAGNOSIS — I6523 Occlusion and stenosis of bilateral carotid arteries: Secondary | ICD-10-CM

## 2016-07-09 DIAGNOSIS — K219 Gastro-esophageal reflux disease without esophagitis: Secondary | ICD-10-CM | POA: Diagnosis not present

## 2016-07-09 DIAGNOSIS — I10 Essential (primary) hypertension: Secondary | ICD-10-CM

## 2016-07-09 DIAGNOSIS — K222 Esophageal obstruction: Secondary | ICD-10-CM | POA: Diagnosis not present

## 2016-07-09 LAB — BASIC METABOLIC PANEL
BUN: 10 mg/dL (ref 6–23)
CHLORIDE: 107 meq/L (ref 96–112)
CO2: 31 mEq/L (ref 19–32)
Calcium: 9.7 mg/dL (ref 8.4–10.5)
Creatinine, Ser: 0.79 mg/dL (ref 0.40–1.20)
GFR: 89.89 mL/min (ref >60.00)
Glucose, Bld: 98 mg/dL (ref 70–99)
POTASSIUM: 4.5 meq/L (ref 3.5–5.1)
Sodium: 143 mEq/L (ref 135–145)

## 2016-07-09 MED ORDER — ASPIRIN EC 81 MG PO TBEC: 81.0000 mg | DELAYED_RELEASE_TABLET | Freq: Every day | ORAL | 3 refills | Status: AC

## 2016-07-09 NOTE — Assessment & Plan Note (Addendum)
Repeat carotid doppler Pt declined statins in the past Declined ASA - start EC baby asa On zetia 10 mg

## 2016-07-09 NOTE — Assessment & Plan Note (Signed)
Cont w/Losartan, Bystolic, Furosemide, Amlodipine 5 mg

## 2016-07-09 NOTE — Progress Notes (Signed)
Pre visit review using our clinic review tool, if applicable. No additional management support is needed unless otherwise documented below in the visit note. 

## 2016-07-09 NOTE — Progress Notes (Signed)
Subjective:  Patient ID: [name], female    DOB: [birth year]  Age: 83 y.o. MRN: [id number]  CC: Follow-up (6 MO FU)   HPI AUBRIANAH WADHWANI presents for HTN, asthma, dyslipidemia, PVD f/u  Outpatient Medications Prior to Visit  Medication Sig Dispense Refill  . albuterol (PROAIR HFA) 108 (90 BASE) MCG/ACT inhaler Inhale 2 puffs into the lungs every 6 (six) hours as needed for wheezing or shortness of breath. 8.5 g 5  . amLODipine (NORVASC) 5 MG tablet take 1 tablet by mouth once daily 30 tablet 5  . aspirin EC 81 MG tablet Take 1 tablet (81 mg total) by mouth daily.    . Azelastine HCl 0.15 % SOLN Place 1-2 sprays into the nose daily. 30 mL 5  . CARAFATE 1 GM/10ML suspension take 2 teaspoonful by mouth twice a day 420 mL 1  . diclofenac sodium (VOLTAREN) 1 % GEL Apply 4 g topically 4 (four) times daily. 200 g 4  . dorzolamide-timolol (COSOPT) 22.3-6.8 MG/ML ophthalmic solution Place 1 drop into both eyes 3 (three) times daily.      . dorzolamide-timolol (COSOPT) 22.3-6.8 MG/ML ophthalmic solution Place 1 drop into both eyes 3 (three) times daily.    Marland Kitchen ezetimibe (ZETIA) 10 MG tablet take 1 tablet by mouth once daily 30 tablet 11  . fexofenadine (ALLEGRA) 180 MG tablet Take 180 mg by mouth daily.      . furosemide (LASIX) 40 MG tablet take 1 tablet by mouth once daily 30 tablet 5  . HYDROcodone-homatropine (HYCODAN) 5-1.5 MG/5ML syrup take 1 teaspoonful by mouth every 6 hours if needed for cough 118 mL 0  . losartan (COZAAR) 100 MG tablet take 1 tablet by mouth once daily 30 tablet 5  . Multiple Vitamins-Minerals (MULTIVITAMIN WITH MINERALS) tablet Take 1 tablet by mouth daily.    . Nebivolol HCl (BYSTOLIC) 20 MG TABS Take 1 tablet (20 mg total) by mouth daily. 30 tablet 5  . omeprazole (PRILOSEC) 40 MG capsule Take 1 capsule (40 mg total) by mouth daily. 30 capsule 11  . pilocarpine (PILOCAR) 4 % ophthalmic solution Place 1 drop into the right eye at bedtime. Reported on 09/05/2015     . pilocarpine (PILOCAR) 4 % ophthalmic solution Place 1 drop into the right eye at bedtime.    Marland Kitchen QVAR 80 MCG/ACT inhaler Inhale 2 puffs into the lungs 2 (two) times daily.    Pauline Aus HFA 45 MCG/ACT inhaler take 1 to 2 puffs at bedtime 15 g 5   No facility-administered medications prior to visit.     ROS Review of Systems  Constitutional: Negative for activity change, appetite change, chills, fatigue and unexpected weight change.  HENT: Negative for congestion, mouth sores and sinus pressure.   Eyes: Negative for visual disturbance.  Respiratory: Negative for cough and chest tightness.   Gastrointestinal: Negative for abdominal pain and nausea.  Genitourinary: Negative for difficulty urinating, frequency and vaginal pain.  Musculoskeletal: Positive for arthralgias. Negative for back pain and gait problem.  Skin: Negative for pallor and rash.  Neurological: Negative for dizziness, tremors, weakness, numbness and headaches.  Psychiatric/Behavioral: Negative for confusion, sleep disturbance and suicidal ideas. The patient is not nervous/anxious.     Objective:  BP 134/78   Pulse (!) 52   Temp 97.8 F (36.6 C)   Ht 5\' 3"  (1.6 m)   Wt 172 lb (78 kg)   SpO2 99%   BMI 30.47 kg/m   BP Readings from Last  3 Encounters:  07/09/16 134/78  01/09/16 139/82  09/05/15 (!) 148/66    Wt Readings from Last 3 Encounters:  07/09/16 172 lb (78 kg)  01/09/16 172 lb (78 kg)  09/05/15 173 lb (78.5 kg)    Physical Exam  Constitutional: She appears well-developed. No distress.  HENT:  Head: Normocephalic.  Right Ear: External ear normal.  Left Ear: External ear normal.  Nose: Nose normal.  Mouth/Throat: Oropharynx is clear and moist.  Eyes: Conjunctivae are normal. Pupils are equal, round, and reactive to light. Right eye exhibits no discharge. Left eye exhibits no discharge.  Neck: Normal range of motion. Neck supple. No JVD present. No tracheal deviation present. No thyromegaly  present.  Cardiovascular: Normal rate, regular rhythm and normal heart sounds.   Pulmonary/Chest: No stridor. No respiratory distress. She has no wheezes.  Abdominal: Soft. Bowel sounds are normal. She exhibits no distension and no mass. There is no tenderness. There is no rebound and no guarding.  Musculoskeletal: She exhibits no edema or tenderness.  Lymphadenopathy:    She has no cervical adenopathy.  Neurological: She displays normal reflexes. No cranial nerve deficit. She exhibits normal muscle tone. Coordination normal.  Skin: No rash noted. No erythema.  Psychiatric: She has a normal mood and affect. Her behavior is normal. Judgment and thought content normal.  cane  Lab Results  Component Value Date   WBC 6.5 01/03/2016   HGB 12.1 01/03/2016   HCT 37.0 01/03/2016   PLT 294.0 01/03/2016   GLUCOSE 87 01/03/2016   CHOL 178 01/03/2016   TRIG 82.0 01/03/2016   HDL 52.80 01/03/2016   LDLCALC 109 (H) 01/03/2016   ALT 8 01/03/2016   AST 14 01/03/2016   NA 144 01/03/2016   K 4.2 01/03/2016   CL 109 01/03/2016   CREATININE 0.83 01/03/2016   BUN 10 01/03/2016   CO2 30 01/03/2016   TSH 1.47 01/03/2016    No results found.  Assessment & Plan:   There are no diagnoses linked to this encounter. I am having Ms. Amberg maintain her fexofenadine, dorzolamide-timolol, QVAR, pilocarpine, multivitamin with minerals, aspirin EC, XOPENEX HFA, albuterol, Azelastine HCl, diclofenac sodium, omeprazole, ezetimibe, dorzolamide-timolol, pilocarpine, furosemide, losartan, CARAFATE, Nebivolol HCl, amLODipine, HYDROcodone-homatropine, and BYSTOLIC.  Meds ordered this encounter  Medications  . BYSTOLIC 10 MG tablet    Sig: Take 10 mg by mouth 1 day or 1 dose.    Refill:  1     Follow-up: No Follow-up on file.  Sonda Primes, MD

## 2016-07-09 NOTE — Patient Instructions (Signed)
MC well w/Jill 

## 2016-07-09 NOTE — Assessment & Plan Note (Signed)
[facility] 

## 2016-07-09 NOTE — Assessment & Plan Note (Addendum)
F/u w/Dr Carlean Purl Omeprazole, Carafate

## 2016-07-10 ENCOUNTER — Other Ambulatory Visit: Payer: Self-pay | Admitting: Internal Medicine

## 2016-07-10 NOTE — Telephone Encounter (Signed)
Completed PA on cover-my-meds. Gave all clinical information received msg back stating med has been "  Approved today". Called [facility] spoke w/Terry gave him approval status. Notified pt has been approved...[name]

## 2016-07-14 ENCOUNTER — Telehealth: Payer: Self-pay

## 2016-07-14 DIAGNOSIS — I6523 Occlusion and stenosis of bilateral carotid arteries: Secondary | ICD-10-CM

## 2016-07-14 NOTE — Telephone Encounter (Signed)
-----   Message from Octavio Manns sent at 07/14/2016  2:20 PM EDT ----- Can you please change Carotid US to [id number] Thanks Cecille Rubin

## 2016-07-20 ENCOUNTER — Telehealth: Payer: Self-pay | Admitting: Internal Medicine

## 2016-07-20 DIAGNOSIS — K219 Gastro-esophageal reflux disease without esophagitis: Secondary | ICD-10-CM

## 2016-07-20 DIAGNOSIS — R1310 Dysphagia, unspecified: Secondary | ICD-10-CM

## 2016-07-20 DIAGNOSIS — K222 Esophageal obstruction: Secondary | ICD-10-CM

## 2016-07-20 NOTE — Telephone Encounter (Signed)
Pt called and said that she would like to go ahead and have an endoscopy done. Can Dr Alain Marion put in these orders for her?

## 2016-07-20 NOTE — Telephone Encounter (Signed)
OK. Thx

## 2016-07-20 NOTE — Telephone Encounter (Signed)
Okay to put in referral. 

## 2016-07-22 ENCOUNTER — Other Ambulatory Visit: Payer: Self-pay | Admitting: Internal Medicine

## 2016-07-29 ENCOUNTER — Encounter: Payer: Self-pay | Admitting: Nurse Practitioner

## 2016-08-05 ENCOUNTER — Encounter: Payer: Self-pay | Admitting: Nurse Practitioner

## 2016-08-05 ENCOUNTER — Ambulatory Visit (INDEPENDENT_AMBULATORY_CARE_PROVIDER_SITE_OTHER): Payer: Medicare Other | Admitting: Nurse Practitioner

## 2016-08-05 VITALS — BP 132/78 | HR 74 | Ht 60.0 in | Wt 170.4 lb

## 2016-08-05 DIAGNOSIS — I6523 Occlusion and stenosis of bilateral carotid arteries: Secondary | ICD-10-CM

## 2016-08-05 DIAGNOSIS — R1310 Dysphagia, unspecified: Secondary | ICD-10-CM

## 2016-08-05 NOTE — Progress Notes (Addendum)
HPI:  Patient is a pleasant 83 year old female, previously known to Dr. Olevia Perches. 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  She is referred by PCP, Dr. Walker Kehr for evaluation of dysphagia. She had an EGD in 2012 for evaluation of dysphagia. A mild, nonobstructing esophageal stricture was seen and Maloney dilation performed. Patient did well up until just about a month ago. She has slowly developed recurrent problems swallowing solids.  Chicken is problematic but really a lot of things feel like they get stuck. No odynophagia. No chest pain. GERD asymptomatic on daily PPI. No odynophagia. No weight loss. She has no other GI complaints. No general medical complaints. Patient states he actually feels quite well and is having a hard time believing that she is actually 72   Past Medical History:  Diagnosis Date  . Allergy, unspecified not elsewhere classified   . Anxiety   . Asthma   . Cataract   . Diverticulitis   . Diverticulosis of colon (without mention of hemorrhage)   . DJD (degenerative joint disease)   . Gastritis   . GERD (gastroesophageal reflux disease)   . Glaucoma   . History of GI diverticular bleed   . HTN (hypertension)   . Hyperlipidemia   . LPRD (laryngopharyngeal reflux disease)      Past Surgical History:  Procedure Laterality Date  . BLADDER REPAIR     after perforation  . cataract surgery  2011   w/ IOL Gershon Crane)  . KIDNEY SURGERY    . KNEE ARTHROSCOPY  1998   left  . TOTAL ABDOMINAL HYSTERECTOMY     fibroid tumor   Family History  Problem Relation Age of Onset  . Heart disease Father        ??  . Other Mother        bladder tumor  . Arthritis Mother   . Colon cancer Neg Hx   . Breast cancer Neg Hx   . Diabetes Neg Hx    Social History  Substance Use Topics  . Smoking status: Never Smoker  . Smokeless tobacco: Never Used  . Alcohol use No   Current Outpatient Prescriptions  Medication Sig  Dispense Refill  . albuterol (PROAIR HFA) 108 (90 BASE) MCG/ACT inhaler Inhale 2 puffs into the lungs every 6 (six) hours as needed for wheezing or shortness of breath. 8.5 g 5  . amLODipine (NORVASC) 5 MG tablet take 1 tablet by mouth once daily 30 tablet 5  . aspirin EC 81 MG tablet Take 1 tablet (81 mg total) by mouth daily. 100 tablet 3  . Azelastine HCl 0.15 % SOLN Place 1-2 sprays into the nose daily. 30 mL 5  . BYSTOLIC 10 MG tablet Take 10 mg by mouth 1 day or 1 dose.  1  . CARAFATE 1 GM/10ML suspension take 2 teaspoonful by mouth twice a day 420 mL 1  . diclofenac sodium (VOLTAREN) 1 % GEL apply 4 grams to affected area four times a day 200 g 4  . dorzolamide-timolol (COSOPT) 22.3-6.8 MG/ML ophthalmic solution Place 1 drop into both eyes 3 (three) times daily.      . dorzolamide-timolol (COSOPT) 22.3-6.8 MG/ML ophthalmic solution Place 1 drop into both eyes 3 (three) times daily.    Marland Kitchen ezetimibe (ZETIA) 10 MG tablet take 1 tablet by mouth once daily 30 tablet 5  . fexofenadine (ALLEGRA) 180 MG tablet Take 180 mg by mouth daily.      Marland Kitchen  furosemide (LASIX) 40 MG tablet take 1 tablet by mouth once daily 30 tablet 5  . HYDROcodone-homatropine (HYCODAN) 5-1.5 MG/5ML syrup take 1 teaspoonful by mouth every 6 hours if needed for cough 118 mL 0  . ipratropium (ATROVENT) 0.06 % nasal spray Place 2 sprays into both nostrils as needed for rhinitis.    Marland Kitchen losartan (COZAAR) 100 MG tablet take 1 tablet by mouth once daily 30 tablet 5  . Multiple Vitamins-Minerals (MULTIVITAMIN WITH MINERALS) tablet Take 1 tablet by mouth daily.    . Nebivolol HCl (BYSTOLIC) 20 MG TABS Take 1 tablet (20 mg total) by mouth daily. 30 tablet 5  . omeprazole (PRILOSEC) 40 MG capsule take 1 capsule by mouth once daily 30 capsule 5  . pilocarpine (PILOCAR) 4 % ophthalmic solution Place 1 drop into the right eye at bedtime. Reported on 09/05/2015    . pilocarpine (PILOCAR) 4 % ophthalmic solution Place 1 drop into the right eye  at bedtime.    Marland Kitchen QVAR 80 MCG/ACT inhaler Inhale 2 puffs into the lungs 2 (two) times daily.    Penne Lash HFA 45 MCG/ACT inhaler take 1 to 2 puffs at bedtime 15 g 5   No current facility-administered medications for this visit.    Allergies  Allergen Reactions  . Budesonide-Formoterol Fumarate     REACTION: causes her to lose her voice  . Codeine     REACTION: NAUSEA  . Coreg [Carvedilol]     Blurred vision, weak  . Guaifenesin     REACTION: blacks out, feels wierd    Review of Systems: Positive for allergies  All other systems reviewed and negative except where noted in HPI.    Physical Exam: BP 132/78   Pulse 74   Ht 5' (1.524 m)   Wt 170 lb 6 oz (77.3 kg)   BMI 33.27 kg/m  Constitutional:  Well-developed, black female in no acute distress. Psychiatric: Normal mood and affect. Behavior is normal. EENT:. Conjunctivae are normal. No scleral icterus. Neck supple.  Cardiovascular: Normal rate, regular rhythm.  Pulmonary/chest: Effort normal and breath sounds normal. No wheezing, rales or rhonchi. Abdominal: Soft, nondistended, nontender. Bowel sounds active throughout. There are no masses palpable. No hepatomegaly. Extremities: no edema Lymphadenopathy: No cervical adenopathy noted. Neurological: Alert and oriented to person place and time. Skin: Skin is warm and dry. No rashes noted.   ASSESSMENT AND PLAN:  Pleasant 83 year old female with recurrent solid food dysphagia. History of mild esophageal stricture in 2012, s/p Maloney dilation. She did well up until about a month ago. Now having recurrent solid dysphagia least 3 times a week. -Will schedule for EGD with probable dilation. The risks and benefits of EGD were discussed and the patient agrees to proceed. She requests Dr. Carlean Purl assume her care since Dr. Olevia Perches has retired.  -Advised patient to eat small bites, chew well with liquids in between bites to avoid food impaction.   Tye Savoy, NP  08/05/2016, 10:21  AM  Cc:  Plotnikov, Evie Lacks, MD  Agree with Ms. Guenther's assessment and plan. Gatha Mayer, MD, [facility]

## 2016-08-05 NOTE — Patient Instructions (Addendum)
If you are age 81 or older, your body mass index should be between 23-30. Your Body mass index is 33.27 kg/m. If this is out of the aforementioned range listed, please consider follow up with your Primary Care Provider.  If you are age 70 or younger, your body mass index should be between 19-25. Your Body mass index is 33.27 kg/m. If this is out of the aformentioned range listed, please consider follow up with your Primary Care Provider.   You have been scheduled for an endoscopy. Please follow written instructions given to you at your visit today. If you use inhalers (even only as needed), please bring them with you on the day of your procedure. Your physician has requested that you go to www.startemmi.com and enter the access code given to you at your visit today. This web site gives a general overview about your procedure. However, you should still follow specific instructions given to you by our office regarding your preparation for the procedure.  Thank you for choosing me and [facility].  Tye Savoy, NP

## 2016-08-07 ENCOUNTER — Ambulatory Visit (HOSPITAL_COMMUNITY)
Admission: RE | Admit: 2016-08-07 | Discharge: 2016-08-07 | Disposition: A | Discharge status: Home / Self Care | Payer: Medicare Other | Source: Ambulatory Visit | Attending: Cardiovascular Disease | Admitting: Cardiovascular Disease

## 2016-08-07 DIAGNOSIS — I6523 Occlusion and stenosis of bilateral carotid arteries: Secondary | ICD-10-CM | POA: Diagnosis not present

## 2016-08-10 ENCOUNTER — Telehealth: Payer: Self-pay | Admitting: Internal Medicine

## 2016-08-10 NOTE — Telephone Encounter (Signed)
[name], Please inform the patient that her carot [place] is stable, need to repeat in 12 months  Thanks, AP

## 2016-08-11 NOTE — Telephone Encounter (Signed)
Pt.notified

## 2016-08-21 ENCOUNTER — Other Ambulatory Visit: Payer: Self-pay | Admitting: Internal Medicine

## 2016-08-29 ENCOUNTER — Other Ambulatory Visit: Payer: Self-pay | Admitting: Internal Medicine

## 2016-08-31 NOTE — Progress Notes (Signed)
Pre visit review using our clinic review tool, if applicable. No additional management support is needed unless otherwise documented below in the visit note. 

## 2016-08-31 NOTE — Progress Notes (Addendum)
Subjective:   [name] is a 83 y.o. female who presents for Medicare Annual (Subsequent) preventive examination.  Review of Systems:  No ROS.  Medicare Wellness Visit. Additional risk factors are reflected in the social history.  Cardiac Risk Factors include: advanced age (>41men, >40 women);dyslipidemia;hypertension Sleep patterns: feels rested on waking, gets up 1 times nightly to void and sleeps 6-8 hours nightly.    Home Safety/Smoke Alarms: Feels safe in home. Smoke alarms in place.  Living environment; residence and Firearm Safety: 1-story house/ trailer, equipment: Hydrologist, Type: Tub Surveyor, quantity, no firearms. Lives son, no needs for DME Seat Belt Safety/Bike Helmet: Wears seat belt.   Counseling:   Eye Exam- appointment every 6 months Dental- appointment every 6 months   Female:   Pap- N/A      Mammo- Last 10/31/14, BI-RADS category 1: negative        Dexa scan- N/D, referral placed today  CCS- N/A    Objective:     Vitals: BP (!) 150/85   Pulse (!) 54   Resp 20   Ht 5' (1.524 m)   Wt 169 lb (76.7 kg)   SpO2 98%   BMI 33.01 kg/m   Body mass index is 33.01 kg/m.   Tobacco History  Smoking Status  . Never Smoker  Smokeless Tobacco  . Never Used     Counseling given: Not Answered   Past Medical History:  Diagnosis Date  . Allergy, unspecified not elsewhere classified   . Anxiety   . Asthma   . Cataract   . Diverticulitis   . Diverticulosis of colon (without mention of hemorrhage)   . DJD (degenerative joint disease)   . Gastritis   . GERD (gastroesophageal reflux disease)   . Glaucoma   . History of GI diverticular bleed   . HTN (hypertension)   . Hyperlipidemia   . LPRD (laryngopharyngeal reflux disease)    Past Surgical History:  Procedure Laterality Date  . BLADDER REPAIR     after perforation  . cataract surgery  2011   w/ IOL Gershon Crane)  . KIDNEY SURGERY    . KNEE ARTHROSCOPY  1998   left  . TOTAL ABDOMINAL  HYSTERECTOMY     fibroid tumor   Family History  Problem Relation Age of Onset  . Heart disease Father        ??  . Other Mother        bladder tumor  . Arthritis Mother   . Colon cancer Neg Hx   . Breast cancer Neg Hx   . Diabetes Neg Hx    History  Sexual Activity  . Sexual activity: Not Currently    Outpatient Encounter Prescriptions as of 09/02/2016  Medication Sig  . acetaminophen (TYLENOL) 325 MG tablet Take 650 mg by mouth every 6 (six) hours as needed.  Marland Kitchen albuterol (PROAIR HFA) 108 (90 BASE) MCG/ACT inhaler Inhale 2 puffs into the lungs every 6 (six) hours as needed for wheezing or shortness of breath.  Marland Kitchen amLODipine (NORVASC) 5 MG tablet take 1 tablet by mouth once daily  . aspirin EC 81 MG tablet Take 1 tablet (81 mg total) by mouth daily.  Marland Kitchen CARAFATE 1 GM/10ML suspension TAKE 2 TEASPOONFUL BY MOUTH TWICE A DAY  . diclofenac sodium (VOLTAREN) 1 % GEL apply 4 grams to affected area four times a day  . dorzolamide-timolol (COSOPT) 22.3-6.8 MG/ML ophthalmic solution Place 1 drop into both eyes 3 (three) times daily.    Marland Kitchen  ezetimibe (ZETIA) 10 MG tablet take 1 tablet by mouth once daily  . fexofenadine (ALLEGRA) 180 MG tablet Take 180 mg by mouth daily.    Marland Kitchen FLOVENT HFA 110 MCG/ACT inhaler Inhale 2 puffs into the lungs 2 (two) times daily.  . furosemide (LASIX) 40 MG tablet take 1 tablet by mouth once daily  . HYDROcodone-homatropine (HYCODAN) 5-1.5 MG/5ML syrup take 1 teaspoonful by mouth every 6 hours if needed for cough  . ipratropium (ATROVENT) 0.06 % nasal spray Place 2 sprays into both nostrils as needed for rhinitis.  Marland Kitchen losartan (COZAAR) 100 MG tablet take 1 tablet by mouth once daily  . Multiple Vitamins-Minerals (MULTIVITAMIN WITH MINERALS) tablet Take 1 tablet by mouth daily.  . Nebivolol HCl (BYSTOLIC) 20 MG TABS Take 1 tablet (20 mg total) by mouth daily.  Marland Kitchen omeprazole (PRILOSEC) 40 MG capsule take 1 capsule by mouth once daily  . pilocarpine (PILOCAR) 4 %  ophthalmic solution Place 1 drop into the right eye at bedtime. Reported on 09/05/2015  . QVAR 80 MCG/ACT inhaler Inhale 2 puffs into the lungs 2 (two) times daily.  . [DISCONTINUED] Azelastine HCl 0.15 % SOLN Place 1-2 sprays into the nose daily. (Patient not taking: Reported on 09/02/2016)  . [DISCONTINUED] BYSTOLIC 10 MG tablet Take 10 mg by mouth 1 day or 1 dose.  . [DISCONTINUED] dorzolamide-timolol (COSOPT) 22.3-6.8 MG/ML ophthalmic solution Place 1 drop into both eyes 3 (three) times daily.  . [DISCONTINUED] pilocarpine (PILOCAR) 4 % ophthalmic solution Place 1 drop into the right eye at bedtime.  . [DISCONTINUED] XOPENEX HFA 45 MCG/ACT inhaler take 1 to 2 puffs at bedtime (Patient not taking: Reported on 09/02/2016)   No facility-administered encounter medications on file as of 09/02/2016.     Activities of Daily Living In your present state of health, do you have any difficulty performing the following activities: 09/02/2016  Hearing? N  Vision? Y  Difficulty concentrating or making decisions? N  Walking or climbing stairs? N  Dressing or bathing? N  Doing errands, shopping? N  Preparing Food and eating ? N  Using the Toilet? N  In the past six months, have you accidently leaked urine? N  Do you have problems with loss of bowel control? N  Managing your Medications? N  Managing your Finances? Y  Housekeeping or managing your Housekeeping? N  Some recent data might be hidden    Patient Care Team: Plotnikov, Evie Lacks, MD as PCP - General (Internal Medicine) Gatha Mayer, MD as Consulting Physician (Gastroenterology)    Assessment:    Physical assessment deferred to PCP.  Exercise Activities and Dietary recommendations Current Exercise Habits: Home exercise routine, Type of exercise: walking;calisthenics, Time (Minutes): 20, Frequency (Times/Week): 7, Weekly Exercise (Minutes/Week): 140, Intensity: Mild, Exercise limited by: orthopedic condition(s) Diet (meal preparation,  eat out, water intake, caffeinated beverages, dairy products, fruits and vegetables): diabetic, low fat/ cholesterol, low salt ,   Goals    . I want to travel          I am going [place] to visits my sisters, visit my nieces, go to [place]. 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      Fall Risk Fall Risk  09/02/2016 06/04/2015 05/30/2014  Falls in the past year? No No No   Depression Screen PHQ 2/9 Scores 09/02/2016 06/04/2015 05/30/2014  PHQ - 2 Score 0 0 0     Cognitive Function MMSE - Mini  Mental State Exam 09/02/2016  Orientation to time 5  Orientation to Place 5  Registration 3  Attention/ Calculation 5  Recall 2  Language- name 2 objects 2  Language- repeat 1  Language- follow 3 step command 3  Language- read & follow direction 1  Write a sentence 1  Copy design 1  Total score 29        Immunization History  Administered Date(s) Administered  . Influenza Split 11/26/2010, 01/25/2012  . Influenza Whole 12/12/2007, 01/28/2009, 11/14/2009  . Influenza, High Dose Seasonal PF 12/19/2012, 01/09/2016  . Influenza,inj,Quad PF,36+ Mos 12/06/2014  . Influenza-Unspecified 12/23/2013  . Pneumococcal Conjugate-13 05/30/2014  . Pneumococcal Polysaccharide-23 06/17/2002, 09/05/2015  . Td 08/20/2011  . Zoster 10/13/2011, 09/29/2012   Screening Tests Health Maintenance  Topic Date Due  . DEXA SCAN  10/18/2000  . INFLUENZA VACCINE  10/14/2016  . TETANUS/TDAP  08/19/2021  . PNA vac Low Risk Adult  Completed      Plan:     Continue to eat heart healthy diet (full of fruits, vegetables, whole grains, lean protein, water--limit salt, fat, and sugar intake) and increase physical activity as tolerated.  Continue doing brain stimulating activities (puzzles, reading, adult coloring books, staying active) to keep memory sharp.   I have personally reviewed and noted the following in the patient's chart:   . Medical and social history . Use  of alcohol, tobacco or illicit drugs  . Current medications and supplements . Functional ability and status . Nutritional status . Physical activity . Advanced directives . List of other physicians . Vitals . Screenings to include cognitive, depression, and falls . Referrals and appointments  In addition, I have reviewed and discussed with patient certain preventive protocols, quality metrics, and best practice recommendations. A written personalized care plan for preventive services as well as general preventive health recommendations were provided to patient.     Michiel Cowboy, RN  09/02/2016   Medical screening examination/treatment/procedure(s) were performed by non-physician practitioner and as supervising physician I was immediately available for consultation/collaboration. I agree with above. Binnie Rail, MD

## 2016-09-02 ENCOUNTER — Ambulatory Visit (INDEPENDENT_AMBULATORY_CARE_PROVIDER_SITE_OTHER): Payer: Medicare Other | Admitting: *Deleted

## 2016-09-02 VITALS — BP 150/85 | HR 54 | Resp 20 | Ht 60.0 in | Wt 169.0 lb

## 2016-09-02 DIAGNOSIS — Z0000 Encounter for general adult medical examination without abnormal findings: Secondary | ICD-10-CM

## 2016-09-02 DIAGNOSIS — E2839 Other primary ovarian failure: Secondary | ICD-10-CM

## 2016-09-02 NOTE — Patient Instructions (Addendum)
Continue to eat heart healthy diet (full of fruits, vegetables, whole grains, lean protein, water--limit salt, fat, and sugar intake) and increase physical activity as tolerated.  Continue doing brain stimulating activities (puzzles, reading, adult coloring books, staying active) to keep memory sharp.    [name] , Thank you for taking time to come for your Medicare Wellness Visit. I appreciate your ongoing commitment to your health goals. Please review the following plan we discussed and let me know if I can assist you in the future.   These are the goals we discussed: Goals    . I want to travel          I am going [place] to visits my sisters, visit my nieces, go to [place]. 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       This is a list of the screening recommended for you and due dates:  Health Maintenance  Topic Date Due  . DEXA scan (bone density measurement)  10/18/2000  . Flu Shot  10/14/2016  . Tetanus Vaccine  08/19/2021  . Pneumonia vaccines  Completed

## 2016-09-04 ENCOUNTER — Ambulatory Visit (AMBULATORY_SURGERY_CENTER): Payer: Medicare Other | Admitting: Internal Medicine

## 2016-09-04 ENCOUNTER — Encounter: Payer: Self-pay | Admitting: Internal Medicine

## 2016-09-04 VITALS — BP 141/72 | HR 56 | Temp 97.1°F | Resp 18 | Ht 60.0 in | Wt 170.0 lb

## 2016-09-04 DIAGNOSIS — R1310 Dysphagia, unspecified: Secondary | ICD-10-CM | POA: Diagnosis not present

## 2016-09-04 DIAGNOSIS — K222 Esophageal obstruction: Secondary | ICD-10-CM | POA: Diagnosis not present

## 2016-09-04 DIAGNOSIS — R1319 Other dysphagia: Secondary | ICD-10-CM

## 2016-09-04 MED ORDER — SODIUM CHLORIDE 0.9 % IV SOLN: 500.0000 mL | INTRAVENOUS | Status: DC

## 2016-09-04 NOTE — Op Note (Signed)
[facility] Patient Name: [name] Procedure Date: 09/04/2016 11:02 AM MRN: [id number] Endoscopist: Iva Boop , MD Age: 83 Referring MD:  Date of Birth: 05/15/1933 Gender: Female Account #: [id number] Procedure:                Upper GI endoscopy Indications:              Dysphagia Medicines:                Propofol per Anesthesia, Monitored Anesthesia Care Procedure:                Pre-Anesthesia Assessment:                           - Prior to the procedure, a History and Physical                            was performed, and patient medications and                            allergies were reviewed. The patient's tolerance of                            previous anesthesia was also reviewed. The risks                            and benefits of the procedure and the sedation                            options and risks were discussed with the patient.                            All questions were answered, and informed consent                            was obtained. Prior Anticoagulants: The patient has                            taken no previous anticoagulant or antiplatelet                            agents. ASA Grade Assessment: II - A patient with                            mild systemic disease. After reviewing the risks                            and benefits, the patient was deemed in                            satisfactory condition to undergo the procedure.                           After obtaining informed consent, the endoscope was  passed under direct vision. Throughout the                            procedure, the patient's blood pressure, pulse, and                            oxygen saturations were monitored continuously. The                            Model GIF-HQ190 [id number]) scope was introduced                            through the mouth, and advanced to the second part                            of duodenum. The  upper GI endoscopy was                            accomplished without difficulty. The patient                            tolerated the procedure well. Scope In: Scope Out: Findings:                 A mild Schatzki ring (acquired) was found at the                            gastroesophageal junction. The scope was withdrawn.                            Dilation was performed with a Maloney dilator with                            mild resistance at 54 Fr. The dilation site was                            examined following endoscope reinsertion and showed                            dilation effect in proximal esophagus and an intact                            ring. Estimated blood loss was minimal. This was                            biopsied with a cold forceps for disruption of ring.                           A 2 cm hiatal hernia was present.                           The exam was otherwise without abnormality.  The cardia and gastric fundus were normal on                            retroflexion. Complications:            No immediate complications. Estimated Blood Loss:     Estimated blood loss was minimal. Impression:               - Mild Schatzki ring. Dilated. Biopsied.                           - 2 cm hiatal hernia.                           - The examination was otherwise normal. Recommendation:           - Clear liquids x 1 hour then soft foods rest of                            day. Start prior diet tomorrow.                           - Continue present medications.                           - Patient has a contact number available for                            emergencies. The signs and symptoms of potential                            delayed complications were discussed with the                            patient. Return to normal activities tomorrow.                            Written discharge instructions were provided to the                             patient. Iva Boop, MD 09/04/2016 11:20:07 AM This report has been signed electronically.

## 2016-09-04 NOTE — Patient Instructions (Addendum)
I dilated the esophagus. I hope this helps you swallow better. If still having problems let me know.  I appreciate the opportunity to care for you. Gatha Mayer, MD, [facility]  Hiatal hernia handout given to patient. Dilation diet handout given to patient.  Clear liquids for one hour, than soft food the rest of the day.  YOU HAD AN ENDOSCOPIC PROCEDURE TODAY AT [facility]:   Refer to the procedure report that was given to you for any specific questions about what was found during the examination.  If the procedure report does not answer your questions, please call your gastroenterologist to clarify.  If you requested that your care partner not be given the details of your procedure findings, then the procedure report has been included in a sealed envelope for you to review at your convenience later.  YOU SHOULD EXPECT: Some feelings of bloating in the abdomen. Passage of more gas than usual.  Walking can help get rid of the air that was put into your GI tract during the procedure and reduce the bloating. If you had a lower endoscopy (such as a colonoscopy or flexible sigmoidoscopy) you may notice spotting of blood in your stool or on the toilet paper. If you underwent a bowel prep for your procedure, you may not have a normal bowel movement for a few days.  Please Note:  You might notice some irritation and congestion in your nose or some drainage.  This is from the oxygen used during your procedure.  There is no need for concern and it should clear up in a day or so.  SYMPTOMS TO REPORT IMMEDIATELY:  Following upper endoscopy (EGD)  Vomiting of blood or coffee ground material  New chest pain or pain under the shoulder blades  Painful or persistently difficult swallowing  New shortness of breath  Fever of 100F or higher  Black, tarry-looking stools  For urgent or emergent issues, a gastroenterologist can be reached at any hour by calling [phone].   DIET:   We do recommend a small meal at first, but then you may proceed to your regular diet.  Drink plenty of fluids but you should avoid alcoholic beverages for 24 hours.  ACTIVITY:  You should plan to take it easy for the rest of today and you should NOT DRIVE or use heavy machinery until tomorrow (because of the sedation medicines used during the test).    FOLLOW UP: Our staff will call the number listed on your records the next business day following your procedure to check on you and address any questions or concerns that you may have regarding the information given to you following your procedure. If we do not reach you, we will leave a message.  However, if you are feeling well and you are not experiencing any problems, there is no need to return our call.  We will assume that you have returned to your regular daily activities without incident.  If any biopsies were taken you will be contacted by phone or by letter within the next 1-3 weeks.  Please call us at [phone] if you have not heard about the biopsies in 3 weeks.    SIGNATURES/CONFIDENTIALITY: You and/or your care partner have signed paperwork which will be entered into your electronic medical record.  These signatures attest to the fact that that the information above on your After Visit Summary has been reviewed and is understood.  Full responsibility of the confidentiality of this  discharge information lies with you and/or your care-partner. 

## 2016-09-04 NOTE — Progress Notes (Signed)
Called to room to assist during endoscopic procedure.  Patient ID and intended procedure confirmed with present staff. Received instructions for my participation in the procedure from the performing physician.  

## 2016-09-04 NOTE — Progress Notes (Signed)
Pt's states no medical or surgical changes since previsit or office visit. maw 

## 2016-09-04 NOTE — Progress Notes (Signed)
To Pacu VSS. Report to RN 

## 2016-09-07 ENCOUNTER — Telehealth: Payer: Self-pay | Admitting: *Deleted

## 2016-09-07 NOTE — Telephone Encounter (Signed)
  Follow up Call-  Call back number 09/04/2016  Post procedure Call Back phone  # [phone] hm  Permission to leave phone message Yes  Some recent data might be hidden     Patient questions:  Do you have a fever, pain , or abdominal swelling? No. Pain Score  0 *  Have you tolerated food without any problems? Yes.    Have you been able to return to your normal activities? Yes.    Do you have any questions about your discharge instructions: Diet   No. Medications  No. Follow up visit  No.  Do you have questions or concerns about your Care? No.  Actions: * If pain score is 4 or above: No action needed, pain <4.

## 2016-09-14 DIAGNOSIS — H401132 Primary open-angle glaucoma, bilateral, moderate stage: Secondary | ICD-10-CM | POA: Diagnosis not present

## 2016-09-17 ENCOUNTER — Telehealth: Payer: Self-pay | Admitting: Internal Medicine

## 2016-09-17 DIAGNOSIS — R102 Pelvic and perineal pain: Secondary | ICD-10-CM

## 2016-09-17 NOTE — Telephone Encounter (Signed)
Pt would like a referral to her OBGYN, she states she needs a referral because she is having pelvic pain. Please advise

## 2016-09-17 NOTE — Telephone Encounter (Signed)
Order entered

## 2016-10-02 ENCOUNTER — Other Ambulatory Visit: Payer: Self-pay

## 2016-10-09 ENCOUNTER — Ambulatory Visit: Payer: Medicare Other | Admitting: Internal Medicine

## 2016-10-20 ENCOUNTER — Other Ambulatory Visit: Payer: Self-pay | Admitting: Internal Medicine

## 2016-10-20 DIAGNOSIS — N8111 Cystocele, midline: Secondary | ICD-10-CM | POA: Diagnosis not present

## 2016-10-20 DIAGNOSIS — N951 Menopausal and female climacteric states: Secondary | ICD-10-CM | POA: Diagnosis not present

## 2016-10-21 ENCOUNTER — Other Ambulatory Visit: Payer: Self-pay | Admitting: Internal Medicine

## 2016-10-21 DIAGNOSIS — J301 Allergic rhinitis due to pollen: Secondary | ICD-10-CM | POA: Diagnosis not present

## 2016-10-21 DIAGNOSIS — J4531 Mild persistent asthma with (acute) exacerbation: Secondary | ICD-10-CM | POA: Diagnosis not present

## 2016-10-21 DIAGNOSIS — J4530 Mild persistent asthma, uncomplicated: Secondary | ICD-10-CM | POA: Diagnosis not present

## 2016-10-21 DIAGNOSIS — J3089 Other allergic rhinitis: Secondary | ICD-10-CM | POA: Diagnosis not present

## 2016-10-22 ENCOUNTER — Ambulatory Visit: Payer: Medicare Other | Admitting: Internal Medicine

## 2016-11-10 ENCOUNTER — Ambulatory Visit: Payer: Medicare Other | Admitting: Internal Medicine

## 2016-11-13 DIAGNOSIS — N8189 Other female genital prolapse: Secondary | ICD-10-CM | POA: Diagnosis not present

## 2016-11-18 ENCOUNTER — Inpatient Hospital Stay: Admission: RE | Admit: 2016-11-18 | Payer: Medicare Other | Source: Ambulatory Visit

## 2016-12-08 ENCOUNTER — Other Ambulatory Visit: Payer: Medicare Other

## 2016-12-08 ENCOUNTER — Ambulatory Visit (INDEPENDENT_AMBULATORY_CARE_PROVIDER_SITE_OTHER)
Admission: RE | Admit: 2016-12-08 | Discharge: 2016-12-08 | Disposition: A | Discharge status: Home / Self Care | Payer: Medicare Other | Source: Ambulatory Visit | Attending: Internal Medicine | Admitting: Internal Medicine

## 2016-12-08 DIAGNOSIS — J3089 Other allergic rhinitis: Secondary | ICD-10-CM | POA: Diagnosis not present

## 2016-12-08 DIAGNOSIS — E2839 Other primary ovarian failure: Secondary | ICD-10-CM | POA: Diagnosis not present

## 2016-12-08 DIAGNOSIS — J4531 Mild persistent asthma with (acute) exacerbation: Secondary | ICD-10-CM | POA: Diagnosis not present

## 2016-12-08 DIAGNOSIS — J4530 Mild persistent asthma, uncomplicated: Secondary | ICD-10-CM | POA: Diagnosis not present

## 2016-12-08 DIAGNOSIS — J301 Allergic rhinitis due to pollen: Secondary | ICD-10-CM | POA: Diagnosis not present

## 2016-12-22 ENCOUNTER — Other Ambulatory Visit: Payer: Self-pay | Admitting: Internal Medicine

## 2016-12-22 DIAGNOSIS — N8189 Other female genital prolapse: Secondary | ICD-10-CM | POA: Diagnosis not present

## 2017-01-06 ENCOUNTER — Encounter: Payer: Self-pay | Admitting: Internal Medicine

## 2017-01-06 ENCOUNTER — Other Ambulatory Visit (INDEPENDENT_AMBULATORY_CARE_PROVIDER_SITE_OTHER): Payer: Medicare Other

## 2017-01-06 ENCOUNTER — Ambulatory Visit (INDEPENDENT_AMBULATORY_CARE_PROVIDER_SITE_OTHER): Payer: Medicare Other | Admitting: Internal Medicine

## 2017-01-06 ENCOUNTER — Telehealth: Payer: Self-pay | Admitting: Internal Medicine

## 2017-01-06 VITALS — BP 138/80 | HR 61 | Temp 98.4°F | Ht 60.0 in | Wt 168.0 lb

## 2017-01-06 DIAGNOSIS — J45909 Unspecified asthma, uncomplicated: Secondary | ICD-10-CM | POA: Diagnosis not present

## 2017-01-06 DIAGNOSIS — Z23 Encounter for immunization: Secondary | ICD-10-CM | POA: Diagnosis not present

## 2017-01-06 DIAGNOSIS — I10 Essential (primary) hypertension: Secondary | ICD-10-CM

## 2017-01-06 DIAGNOSIS — E785 Hyperlipidemia, unspecified: Secondary | ICD-10-CM

## 2017-01-06 DIAGNOSIS — F411 Generalized anxiety disorder: Secondary | ICD-10-CM

## 2017-01-06 DIAGNOSIS — J309 Allergic rhinitis, unspecified: Secondary | ICD-10-CM | POA: Diagnosis not present

## 2017-01-06 DIAGNOSIS — I6523 Occlusion and stenosis of bilateral carotid arteries: Secondary | ICD-10-CM | POA: Diagnosis not present

## 2017-01-06 LAB — HEPATIC FUNCTION PANEL
ALBUMIN: 3.8 g/dL (ref 3.5–5.2)
ALT: 9 U/L (ref 0–35)
AST: 14 U/L (ref 0–37)
Alkaline Phosphatase: 69 U/L (ref 39–117)
BILIRUBIN DIRECT: 0.1 mg/dL (ref 0.0–0.3)
TOTAL PROTEIN: 6.8 g/dL (ref 6.0–8.3)
Total Bilirubin: 0.5 mg/dL (ref 0.2–1.2)

## 2017-01-06 LAB — LIPID PANEL
CHOLESTEROL: 177 mg/dL (ref 0–200)
HDL: 59.5 mg/dL (ref >39.00)
LDL Cholesterol: 104 mg/dL — ABNORMAL HIGH (ref 0–99)
NONHDL: 117.1
Total CHOL/HDL Ratio: 3
Triglycerides: 66 mg/dL (ref 0.0–149.0)
VLDL: 13.2 mg/dL (ref 0.0–40.0)

## 2017-01-06 LAB — URINALYSIS, ROUTINE W REFLEX MICROSCOPIC
Bilirubin Urine: NEGATIVE
Hgb urine dipstick: NEGATIVE
Ketones, ur: NEGATIVE
Nitrite: NEGATIVE
PH: 7 (ref 5.0–8.0)
SPECIFIC GRAVITY, URINE: 1.01 (ref 1.000–1.030)
Total Protein, Urine: NEGATIVE
Urine Glucose: NEGATIVE
Urobilinogen, UA: 0.2 (ref 0.0–1.0)

## 2017-01-06 LAB — BASIC METABOLIC PANEL
BUN: 12 mg/dL (ref 6–23)
CALCIUM: 9.5 mg/dL (ref 8.4–10.5)
CHLORIDE: 105 meq/L (ref 96–112)
CO2: 30 meq/L (ref 19–32)
Creatinine, Ser: 0.83 mg/dL (ref 0.40–1.20)
GFR: 84.81 mL/min (ref >60.00)
GLUCOSE: 93 mg/dL (ref 70–99)
Potassium: 3.8 mEq/L (ref 3.5–5.1)
SODIUM: 143 meq/L (ref 135–145)

## 2017-01-06 LAB — TSH: TSH: 1.94 u[IU]/mL (ref 0.35–4.50)

## 2017-01-06 LAB — CBC WITH DIFFERENTIAL/PLATELET
BASOS ABS: 0.1 10*3/uL (ref 0.0–0.1)
Basophils Relative: 1.1 % (ref 0.0–3.0)
Eosinophils Absolute: 0.6 10*3/uL (ref 0.0–0.7)
Eosinophils Relative: 8.3 % — ABNORMAL HIGH (ref 0.0–5.0)
HCT: 38.4 % (ref 36.0–46.0)
Hemoglobin: 12.3 g/dL (ref 12.0–15.0)
LYMPHS ABS: 2.1 10*3/uL (ref 0.7–4.0)
Lymphocytes Relative: 29.6 % (ref 12.0–46.0)
MCHC: 32 g/dL (ref 30.0–36.0)
MCV: 85.8 fl (ref 78.0–100.0)
MONO ABS: 0.5 10*3/uL (ref 0.1–1.0)
Monocytes Relative: 7.3 % (ref 3.0–12.0)
NEUTROS ABS: 3.8 10*3/uL (ref 1.4–7.7)
NEUTROS PCT: 53.7 % (ref 43.0–77.0)
PLATELETS: 261 10*3/uL (ref 150.0–400.0)
RBC: 4.47 Mil/uL (ref 3.87–5.11)
RDW: 15.7 % — ABNORMAL HIGH (ref 11.5–15.5)
WBC: 7 10*3/uL (ref 4.0–10.5)

## 2017-01-06 MED ORDER — HYDROCODONE-HOMATROPINE 5-1.5 MG/5ML PO SYRP: ORAL_SOLUTION | ORAL | 0 refills | Status: DC

## 2017-01-06 MED ORDER — MONTELUKAST SODIUM 10 MG PO TABS: 10.0000 mg | ORAL_TABLET | Freq: Every day | ORAL | 11 refills | Status: DC

## 2017-01-06 NOTE — Progress Notes (Signed)
Subjective:  Patient ID: [name], female    DOB: January 04, 1934  Age: 83 y.o. MRN: [id number]  CC: No chief complaint on file.   HPI [name] presents for allergies, HTN, GERD f/u. C/o occ cough  Outpatient Medications Prior to Visit  Medication Sig Dispense Refill  . acetaminophen (TYLENOL) 325 MG tablet Take 650 mg by mouth every 6 (six) hours as needed.    Marland Kitchen albuterol (PROAIR HFA) 108 (90 BASE) MCG/ACT inhaler Inhale 2 puffs into the lungs every 6 (six) hours as needed for wheezing or shortness of breath. 8.5 g 5  . amLODipine (NORVASC) 5 MG tablet take 1 tablet by mouth once daily 30 tablet 5  . aspirin EC 81 MG tablet Take 1 tablet (81 mg total) by mouth daily. 100 tablet 3  . BYSTOLIC 10 MG tablet take 2 tablets by mouth once daily 60 tablet 11  . CARAFATE 1 GM/10ML suspension TAKE 2 TEASPOONFUL BY MOUTH TWICE A DAY 420 mL 1  . diclofenac sodium (VOLTAREN) 1 % GEL apply 4 grams to affected area four times a day 200 g 4  . dorzolamide-timolol (COSOPT) 22.3-6.8 MG/ML ophthalmic solution Place 1 drop into both eyes 3 (three) times daily.      Marland Kitchen ezetimibe (ZETIA) 10 MG tablet take 1 tablet by mouth once daily 30 tablet 5  . fexofenadine (ALLEGRA) 180 MG tablet Take 180 mg by mouth daily.      Marland Kitchen FLOVENT HFA 110 MCG/ACT inhaler Inhale 2 puffs into the lungs 2 (two) times daily.  0  . furosemide (LASIX) 40 MG tablet Take 1 tablet (40 mg total) by mouth daily. 30 tablet 0  . HYDROcodone-homatropine (HYCODAN) 5-1.5 MG/5ML syrup take 1 teaspoonful by mouth every 6 hours if needed for cough 118 mL 0  . ipratropium (ATROVENT) 0.06 % nasal spray Place 2 sprays into both nostrils as needed for rhinitis.    Marland Kitchen losartan (COZAAR) 100 MG tablet take 1 tablet by mouth once daily 30 tablet 5  . Multiple Vitamins-Minerals (MULTIVITAMIN WITH MINERALS) tablet Take 1 tablet by mouth daily.    . Nebivolol HCl (BYSTOLIC) 20 MG TABS Take 1 tablet (20 mg total) by mouth daily. 30 tablet 5  .  omeprazole (PRILOSEC) 40 MG capsule take 1 capsule by mouth once daily 30 capsule 5  . pilocarpine (PILOCAR) 4 % ophthalmic solution Place 1 drop into the right eye at bedtime. Reported on 09/05/2015    . QVAR 80 MCG/ACT inhaler Inhale 2 puffs into the lungs 2 (two) times daily.     Facility-Administered Medications Prior to Visit  Medication Dose Route Frequency Provider Last Rate Last Dose  . 0.9 %  sodium chloride infusion  500 mL Intravenous Continuous Iva Boop, MD        ROS Review of Systems  Constitutional: Negative for activity change, appetite change, chills, fatigue and unexpected weight change.  HENT: Positive for congestion. Negative for mouth sores and sinus pressure.   Eyes: Negative for visual disturbance.  Respiratory: Negative for cough and chest tightness.   Gastrointestinal: Negative for abdominal pain and nausea.  Genitourinary: Negative for difficulty urinating, frequency and vaginal pain.  Musculoskeletal: Negative for back pain and gait problem.  Skin: Negative for pallor and rash.  Neurological: Negative for dizziness, tremors, weakness, numbness and headaches.  Psychiatric/Behavioral: Negative for confusion and sleep disturbance.    Objective:  BP 138/80 (BP Location: Left Arm, Patient Position: Sitting, Cuff Size: Normal)   Pulse  61   Temp 98.4 F (36.9 C) (Oral)   Ht 5' (1.524 m)   Wt 168 lb (76.2 kg)   SpO2 99%   BMI 32.81 kg/m   BP Readings from Last 3 Encounters:  01/06/17 138/80  09/04/16 (!) 141/72  09/02/16 (!) 150/85    Wt Readings from Last 3 Encounters:  01/06/17 168 lb (76.2 kg)  09/04/16 170 lb (77.1 kg)  09/02/16 169 lb (76.7 kg)    Physical Exam  Constitutional: She appears well-developed. No distress.  HENT:  Head: Normocephalic.  Right Ear: External ear normal.  Left Ear: External ear normal.  Nose: Nose normal.  Mouth/Throat: Oropharynx is clear and moist.  Eyes: Pupils are equal, round, and reactive to light.  Conjunctivae are normal. Right eye exhibits no discharge. Left eye exhibits no discharge.  Neck: Normal range of motion. Neck supple. No JVD present. No tracheal deviation present. No thyromegaly present.  Cardiovascular: Normal rate, regular rhythm and normal heart sounds.   Pulmonary/Chest: No stridor. No respiratory distress. She has no wheezes.  Abdominal: Soft. Bowel sounds are normal. She exhibits no distension and no mass. There is no tenderness. There is no rebound and no guarding.  Musculoskeletal: She exhibits no edema or tenderness.  Lymphadenopathy:    She has no cervical adenopathy.  Neurological: She displays normal reflexes. No cranial nerve deficit. She exhibits normal muscle tone. Coordination normal.  Skin: No rash noted. No erythema.  Psychiatric: She has a normal mood and affect. Her behavior is normal. Judgment and thought content normal.    Lab Results  Component Value Date   WBC 6.5 01/03/2016   HGB 12.1 01/03/2016   HCT 37.0 01/03/2016   PLT 294.0 01/03/2016   GLUCOSE 98 07/09/2016   CHOL 178 01/03/2016   TRIG 82.0 01/03/2016   HDL 52.80 01/03/2016   LDLCALC 109 (H) 01/03/2016   ALT 8 01/03/2016   AST 14 01/03/2016   NA 143 07/09/2016   K 4.5 07/09/2016   CL 107 07/09/2016   CREATININE 0.79 07/09/2016   BUN 10 07/09/2016   CO2 31 07/09/2016   TSH 1.47 01/03/2016    Dg Bone Density (dxa)  Result Date: 12/10/2016 Date of study: 12/08/2016 Exam: DUAL X-RAY ABSORPTIOMETRY (DXA) FOR BONE MINERAL DENSITY (BMD) Instrument: [employer] Therapist, art Provider: PCP Indication: screening for osteoporosis Comparison: none (please note that it is not possible to compare data from different instruments) Clinical data: Pt is a 82 y.o. female without previous fractures. On calcium and vitamin D. Results:  Lumbar spine L1-L4 Femoral neck (FN) T-score -1.0 RFN: -1.0 LFN: -1.0 Assessment: the BMD is normal according to the [facility] classification for osteoporosis (see  below). Fracture risk: low FRAX score: not calculated due to normal BMD Comments: the technical quality of the study is good Recommend optimizing calcium (1200 mg/day) and vitamin D (800 IU/day) intake. No pharmacological treatment is indicated. Followup: Repeat BMD is appropriate after 2 years. WHO criteria for diagnosis of osteoporosis in postmenopausal women and in men 24 y/o or older: - normal: T-score -1.0 to + 1.0 - osteopenia/low bone density: T-score between -2.5 and -1.0 - osteoporosis: T-score below -2.5 - severe osteoporosis: T-score below -2.5 with history of fragility fracture Note: although not part of the WHO classification, the presence of a fragility fracture, regardless of the T-score, should be considered diagnostic of osteoporosis, provided other causes for the fracture have been excluded. Carlus Pavlov, MD [facility]    Assessment & Plan:  There are no diagnoses linked to this encounter. I am having [name] maintain her fexofenadine, dorzolamide-timolol, QVAR, pilocarpine, multivitamin with minerals, albuterol, Nebivolol HCl, HYDROcodone-homatropine, aspirin EC, diclofenac sodium, omeprazole, ezetimibe, ipratropium, losartan, CARAFATE, acetaminophen, FLOVENT HFA, amLODipine, BYSTOLIC, and furosemide. We will continue to administer sodium chloride.  No orders of the defined types were placed in this encounter.    Follow-up: No Follow-up on file.  Sonda Primes, MD

## 2017-01-06 NOTE — Assessment & Plan Note (Signed)
Losartan, Bystolic, Furosemide, Amlodipine 5 mg 

## 2017-01-06 NOTE — Assessment & Plan Note (Addendum)
Xopenex prn, Qvar Treat GERD Tussionex prn Trial of Singulair

## 2017-01-06 NOTE — Assessment & Plan Note (Signed)
Doing well 

## 2017-01-06 NOTE — Assessment & Plan Note (Signed)
Labs

## 2017-01-06 NOTE — Patient Instructions (Signed)
Well w/Jill 

## 2017-01-06 NOTE — Telephone Encounter (Signed)
Patient states she missed a call. Did you call patient?

## 2017-01-06 NOTE — Assessment & Plan Note (Signed)
Trial of Singulair

## 2017-01-07 NOTE — Addendum Note (Signed)
Addended by: Karren Cobble on: 01/07/2017 01:53 PM   Modules accepted: Orders

## 2017-01-07 NOTE — Telephone Encounter (Signed)
I did not call pt yesterday but pt informed of lab results

## 2017-01-17 ENCOUNTER — Other Ambulatory Visit: Payer: Self-pay | Admitting: Internal Medicine

## 2017-01-18 ENCOUNTER — Telehealth: Payer: Self-pay | Admitting: Internal Medicine

## 2017-01-18 NOTE — Telephone Encounter (Signed)
The pt has been advised to follow up with PCP about SOB.  She has called PCP and will follow up with GI as needed.

## 2017-01-25 DIAGNOSIS — Z961 Presence of intraocular lens: Secondary | ICD-10-CM | POA: Diagnosis not present

## 2017-01-25 DIAGNOSIS — H2511 Age-related nuclear cataract, right eye: Secondary | ICD-10-CM | POA: Diagnosis not present

## 2017-01-25 DIAGNOSIS — H401132 Primary open-angle glaucoma, bilateral, moderate stage: Secondary | ICD-10-CM | POA: Diagnosis not present

## 2017-01-25 DIAGNOSIS — H524 Presbyopia: Secondary | ICD-10-CM | POA: Diagnosis not present

## 2017-02-09 ENCOUNTER — Telehealth: Payer: Self-pay | Admitting: Internal Medicine

## 2017-02-09 NOTE — Telephone Encounter (Signed)
Patient with chronic cough that was imporved by antibiotics and cough medicine.  She is wondering if this is her reflux, allergies, or other source.  She is advised to start with her primary care and if they feel that it is GI related we will get her in to be seen.

## 2017-02-17 ENCOUNTER — Other Ambulatory Visit: Payer: Self-pay | Admitting: Internal Medicine

## 2017-02-17 ENCOUNTER — Ambulatory Visit (INDEPENDENT_AMBULATORY_CARE_PROVIDER_SITE_OTHER): Payer: Medicare Other | Admitting: Internal Medicine

## 2017-02-17 ENCOUNTER — Ambulatory Visit (INDEPENDENT_AMBULATORY_CARE_PROVIDER_SITE_OTHER)
Admission: RE | Admit: 2017-02-17 | Discharge: 2017-02-17 | Disposition: A | Discharge status: Home / Self Care | Payer: Medicare Other | Source: Ambulatory Visit | Attending: Internal Medicine | Admitting: Internal Medicine

## 2017-02-17 ENCOUNTER — Other Ambulatory Visit (INDEPENDENT_AMBULATORY_CARE_PROVIDER_SITE_OTHER): Payer: Medicare Other

## 2017-02-17 ENCOUNTER — Encounter: Payer: Self-pay | Admitting: Internal Medicine

## 2017-02-17 VITALS — BP 138/78 | HR 62 | Temp 98.4°F | Ht 60.0 in | Wt 167.0 lb

## 2017-02-17 DIAGNOSIS — R0602 Shortness of breath: Secondary | ICD-10-CM

## 2017-02-17 DIAGNOSIS — R0609 Other forms of dyspnea: Principal | ICD-10-CM

## 2017-02-17 DIAGNOSIS — R05 Cough: Secondary | ICD-10-CM | POA: Insufficient documentation

## 2017-02-17 DIAGNOSIS — R059 Cough, unspecified: Secondary | ICD-10-CM

## 2017-02-17 DIAGNOSIS — I6523 Occlusion and stenosis of bilateral carotid arteries: Secondary | ICD-10-CM | POA: Diagnosis not present

## 2017-02-17 LAB — BASIC METABOLIC PANEL
BUN: 11 mg/dL (ref 6–23)
CO2: 29 mEq/L (ref 19–32)
Calcium: 9.1 mg/dL (ref 8.4–10.5)
Chloride: 107 mEq/L (ref 96–112)
Creatinine, Ser: 0.74 mg/dL (ref 0.40–1.20)
GFR: 96.79 mL/min (ref >60.00)
GLUCOSE: 96 mg/dL (ref 70–99)
POTASSIUM: 4.1 meq/L (ref 3.5–5.1)
Sodium: 142 mEq/L (ref 135–145)

## 2017-02-17 LAB — BRAIN NATRIURETIC PEPTIDE: Pro B Natriuretic peptide (BNP): 217 pg/mL — ABNORMAL HIGH (ref 0.0–100.0)

## 2017-02-17 MED ORDER — HYDROCODONE-HOMATROPINE 5-1.5 MG/5ML PO SYRP: ORAL_SOLUTION | ORAL | 0 refills | Status: DC

## 2017-02-17 MED ORDER — FUROSEMIDE 40 MG PO TABS: 40.0000 mg | ORAL_TABLET | Freq: Every day | ORAL | 5 refills | Status: DC

## 2017-02-17 NOTE — Progress Notes (Signed)
Subjective:  Patient ID: [name], female    DOB: Nov 03, 1933  Age: 83 y.o. MRN: [id number]  CC: No chief complaint on file.   HPI [name] presents for cough and allergies. Cough is worse this fall. She saw Dr Meriel Flavors F/u HTN, dyslipidemia, HH f/u  Outpatient Medications Prior to Visit  Medication Sig Dispense Refill  . acetaminophen (TYLENOL) 325 MG tablet Take 650 mg by mouth every 6 (six) hours as needed.    Marland Kitchen albuterol (PROAIR HFA) 108 (90 BASE) MCG/ACT inhaler Inhale 2 puffs into the lungs every 6 (six) hours as needed for wheezing or shortness of breath. 8.5 g 5  . amLODipine (NORVASC) 5 MG tablet take 1 tablet by mouth once daily 30 tablet 5  . aspirin EC 81 MG tablet Take 1 tablet (81 mg total) by mouth daily. 100 tablet 3  . BYSTOLIC 10 MG tablet take 2 tablets by mouth once daily 60 tablet 11  . CARAFATE 1 GM/10ML suspension TAKE 2 TEASPOONFUL BY MOUTH TWICE A DAY 420 mL 1  . diclofenac sodium (VOLTAREN) 1 % GEL apply 4 grams to affected area four times a day 200 g 4  . dorzolamide-timolol (COSOPT) 22.3-6.8 MG/ML ophthalmic solution Place 1 drop into both eyes 3 (three) times daily.      Marland Kitchen ezetimibe (ZETIA) 10 MG tablet take 1 tablet by mouth once daily 30 tablet 5  . fexofenadine (ALLEGRA) 180 MG tablet Take 180 mg by mouth daily.      Marland Kitchen FLOVENT HFA 110 MCG/ACT inhaler Inhale 2 puffs into the lungs 2 (two) times daily.  0  . furosemide (LASIX) 40 MG tablet Take 1 tablet (40 mg total) by mouth daily. 30 tablet 0  . HYDROcodone-homatropine (HYCODAN) 5-1.5 MG/5ML syrup take 1 teaspoonful by mouth every 6 hours if needed for cough 118 mL 0  . ipratropium (ATROVENT) 0.06 % nasal spray Place 2 sprays into both nostrils as needed for rhinitis.    Marland Kitchen losartan (COZAAR) 100 MG tablet take 1 tablet by mouth once daily 30 tablet 5  . montelukast (SINGULAIR) 10 MG tablet Take 1 tablet (10 mg total) by mouth daily. 30 tablet 11  . Multiple Vitamins-Minerals (MULTIVITAMIN WITH  MINERALS) tablet Take 1 tablet by mouth daily.    . Nebivolol HCl (BYSTOLIC) 20 MG TABS Take 1 tablet (20 mg total) by mouth daily. 30 tablet 5  . omeprazole (PRILOSEC) 40 MG capsule take 1 capsule by mouth once daily 30 capsule 5  . pilocarpine (PILOCAR) 4 % ophthalmic solution Place 1 drop into the right eye at bedtime. Reported on 09/05/2015    . QVAR 80 MCG/ACT inhaler Inhale 2 puffs into the lungs 2 (two) times daily.     No facility-administered medications prior to visit.     ROS Review of Systems  Constitutional: Negative for activity change, appetite change, chills, fatigue and unexpected weight change.  HENT: Negative for congestion, mouth sores and sinus pressure.   Eyes: Negative for visual disturbance.  Respiratory: Positive for cough and shortness of breath. Negative for chest tightness and wheezing.   Gastrointestinal: Negative for abdominal pain and nausea.  Genitourinary: Negative for difficulty urinating, frequency and vaginal pain.  Musculoskeletal: Negative for back pain and gait problem.  Skin: Negative for pallor and rash.  Neurological: Negative for dizziness, tremors, weakness, numbness and headaches.  Psychiatric/Behavioral: Negative for confusion, decreased concentration, sleep disturbance and suicidal ideas. The patient is not nervous/anxious.     Objective:  BP 138/78 (BP Location: Left Arm, Patient Position: Sitting, Cuff Size: Large)   Pulse 62   Temp 98.4 F (36.9 C) (Oral)   Ht 5' (1.524 m)   Wt 167 lb (75.8 kg)   SpO2 99%   BMI 32.61 kg/m   BP Readings from Last 3 Encounters:  02/17/17 138/78  01/06/17 138/80  09/04/16 (!) 141/72    Wt Readings from Last 3 Encounters:  02/17/17 167 lb (75.8 kg)  01/06/17 168 lb (76.2 kg)  09/04/16 170 lb (77.1 kg)    Physical Exam  Constitutional: She appears well-developed. No distress.  HENT:  Head: Normocephalic.  Right Ear: External ear normal.  Left Ear: External ear normal.  Nose: Nose normal.    Mouth/Throat: Oropharynx is clear and moist.  Eyes: Conjunctivae are normal. Pupils are equal, round, and reactive to light. Right eye exhibits no discharge. Left eye exhibits no discharge.  Neck: Normal range of motion. Neck supple. No JVD present. No tracheal deviation present. No thyromegaly present.  Cardiovascular: Normal rate, regular rhythm and normal heart sounds.  Pulmonary/Chest: No stridor. No respiratory distress. She has no wheezes.  Abdominal: Soft. Bowel sounds are normal. She exhibits no distension and no mass. There is no tenderness. There is no rebound and no guarding.  Musculoskeletal: She exhibits no edema or tenderness.  Lymphadenopathy:    She has no cervical adenopathy.  Neurological: She displays normal reflexes. No cranial nerve deficit. She exhibits normal muscle tone. Coordination normal.  Skin: No rash noted. No erythema.  Psychiatric: She has a normal mood and affect. Her behavior is normal. Judgment and thought content normal.  Rhonchi L>>R No JVD  Lab Results  Component Value Date   WBC 7.0 01/06/2017   HGB 12.3 01/06/2017   HCT 38.4 01/06/2017   PLT 261.0 01/06/2017   GLUCOSE 93 01/06/2017   CHOL 177 01/06/2017   TRIG 66.0 01/06/2017   HDL 59.50 01/06/2017   LDLCALC 104 (H) 01/06/2017   ALT 9 01/06/2017   AST 14 01/06/2017   NA 143 01/06/2017   K 3.8 01/06/2017   CL 105 01/06/2017   CREATININE 0.83 01/06/2017   BUN 12 01/06/2017   CO2 30 01/06/2017   TSH 1.94 01/06/2017    Dg Bone Density (dxa)  Result Date: 12/10/2016 Date of study: 12/08/2016 Exam: DUAL X-RAY ABSORPTIOMETRY (DXA) FOR BONE MINERAL DENSITY (BMD) Instrument: [employer] Therapist, art Provider: PCP Indication: screening for osteoporosis Comparison: none (please note that it is not possible to compare data from different instruments) Clinical data: Pt is a 83 y.o. female without previous fractures. On calcium and vitamin D. Results:  Lumbar spine L1-L4 Femoral neck (FN)  T-score -1.0 RFN: -1.0 LFN: -1.0 Assessment: the BMD is normal according to the [facility] classification for osteoporosis (see below). Fracture risk: low FRAX score: not calculated due to normal BMD Comments: the technical quality of the study is good Recommend optimizing calcium (1200 mg/day) and vitamin D (800 IU/day) intake. No pharmacological treatment is indicated. Followup: Repeat BMD is appropriate after 2 years. WHO criteria for diagnosis of osteoporosis in postmenopausal women and in men 60 y/o or older: - normal: T-score -1.0 to + 1.0 - osteopenia/low bone density: T-score between -2.5 and -1.0 - osteoporosis: T-score below -2.5 - severe osteoporosis: T-score below -2.5 with history of fragility fracture Note: although not part of the WHO classification, the presence of a fragility fracture, regardless of the T-score, should be considered diagnostic of osteoporosis, provided other causes for the  fracture have been excluded. Carlus Pavlov, MD [facility]    Assessment & Plan:   There are no diagnoses linked to this encounter. I am having [name] maintain her fexofenadine, dorzolamide-timolol, QVAR, pilocarpine, multivitamin with minerals, albuterol, Nebivolol HCl, aspirin EC, diclofenac sodium, omeprazole, ipratropium, losartan, CARAFATE, acetaminophen, FLOVENT HFA, amLODipine, BYSTOLIC, furosemide, HYDROcodone-homatropine, montelukast, and ezetimibe.  No orders of the defined types were placed in this encounter.    Follow-up: No Follow-up on file.  Sonda Primes, MD

## 2017-02-17 NOTE — Assessment & Plan Note (Signed)
CXR Consider CT and pulm ref  Tussionex prn Labs: BNP, BMET

## 2017-02-18 ENCOUNTER — Other Ambulatory Visit: Payer: Self-pay | Admitting: General Practice

## 2017-02-18 MED ORDER — LOSARTAN POTASSIUM 100 MG PO TABS: 100.0000 mg | ORAL_TABLET | Freq: Every day | ORAL | 2 refills | Status: DC

## 2017-02-18 MED ORDER — FUROSEMIDE 40 MG PO TABS: 40.0000 mg | ORAL_TABLET | Freq: Every day | ORAL | 3 refills | Status: DC

## 2017-03-04 ENCOUNTER — Ambulatory Visit (HOSPITAL_COMMUNITY): Payer: Medicare Other | Attending: Cardiovascular Disease

## 2017-03-04 ENCOUNTER — Other Ambulatory Visit: Payer: Self-pay

## 2017-03-04 DIAGNOSIS — I119 Hypertensive heart disease without heart failure: Secondary | ICD-10-CM | POA: Insufficient documentation

## 2017-03-04 DIAGNOSIS — I340 Nonrheumatic mitral (valve) insufficiency: Secondary | ICD-10-CM | POA: Diagnosis not present

## 2017-03-04 DIAGNOSIS — Z8249 Family history of ischemic heart disease and other diseases of the circulatory system: Secondary | ICD-10-CM | POA: Diagnosis not present

## 2017-03-04 DIAGNOSIS — R0609 Other forms of dyspnea: Secondary | ICD-10-CM | POA: Diagnosis not present

## 2017-03-04 DIAGNOSIS — E785 Hyperlipidemia, unspecified: Secondary | ICD-10-CM | POA: Diagnosis not present

## 2017-03-11 ENCOUNTER — Telehealth: Payer: Self-pay | Admitting: Internal Medicine

## 2017-03-11 NOTE — Telephone Encounter (Signed)
Copied from [place] [id number]. Topic: Quick Communication - Rx Refill/Question >> Mar 11, 2017  4:29 PM [name] wrote: Reason for CRM: pt called b/c she is still feeling terrible and wanted to know if she could be called in something until she see's plotnikov on next Friday, contact pt to advise

## 2017-03-12 NOTE — Telephone Encounter (Signed)
Pt states her allergist called her in abx, so she will not need anything from Dr Alain Marion. Thank you anyway.

## 2017-03-12 NOTE — Telephone Encounter (Signed)
Called pt to verify msg below. Needing more information. Pt states she is still have the cough when she saw MD back on 12/5. Yesterday she was coughing so much she was having SOB sxs. She also called her allergist (Dr. Mee Hives) yesterday, and they have called her in a antibiotic, but she willkeep her appt next keep w/Dr. Plotnikov because at the last visit he ordered some test which he is suppose to go over. Inform pt to take med that was given by Dr. Mee Hives, and we will see her next Thurs on 03/19/16...Johny Chess

## 2017-03-18 ENCOUNTER — Other Ambulatory Visit: Payer: Self-pay | Admitting: Internal Medicine

## 2017-03-19 ENCOUNTER — Encounter: Payer: Self-pay | Admitting: Internal Medicine

## 2017-03-19 ENCOUNTER — Ambulatory Visit (INDEPENDENT_AMBULATORY_CARE_PROVIDER_SITE_OTHER): Payer: Medicare Other | Admitting: Internal Medicine

## 2017-03-19 DIAGNOSIS — J45909 Unspecified asthma, uncomplicated: Secondary | ICD-10-CM

## 2017-03-19 DIAGNOSIS — I10 Essential (primary) hypertension: Secondary | ICD-10-CM | POA: Diagnosis not present

## 2017-03-19 DIAGNOSIS — J209 Acute bronchitis, unspecified: Secondary | ICD-10-CM | POA: Diagnosis not present

## 2017-03-19 MED ORDER — METHYLPREDNISOLONE ACETATE 80 MG/ML IJ SUSP
80.0000 mg | Freq: Once | INTRAMUSCULAR | Status: AC
  Administered 2017-03-19: 80 mg via INTRAMUSCULAR

## 2017-03-19 MED ORDER — HYDROCODONE-HOMATROPINE 5-1.5 MG/5ML PO SYRP: ORAL_SOLUTION | ORAL | 0 refills | Status: DC

## 2017-03-19 MED ORDER — CEFDINIR 300 MG PO CAPS: 300.0000 mg | ORAL_CAPSULE | Freq: Two times a day (BID) | ORAL | 0 refills | Status: DC

## 2017-03-19 NOTE — Assessment & Plan Note (Signed)
Losartan, Bystolic, Furosemide, Amlodipine 5 mg 

## 2017-03-19 NOTE — Progress Notes (Signed)
Subjective:  Patient ID: [name], female    DOB: January 02, 1934  Age: 84 y.o. MRN: [id number]  CC: No chief complaint on file.   HPI [name] presents for URI sx's x 1 mo. Dr Gilford Rile called in prednisone - feeling a little better  Outpatient Medications Prior to Visit  Medication Sig Dispense Refill  . acetaminophen (TYLENOL) 325 MG tablet Take 650 mg by mouth every 6 (six) hours as needed.    Marland Kitchen albuterol (PROAIR HFA) 108 (90 BASE) MCG/ACT inhaler Inhale 2 puffs into the lungs every 6 (six) hours as needed for wheezing or shortness of breath. 8.5 g 5  . amLODipine (NORVASC) 5 MG tablet take 1 tablet by mouth once daily 30 tablet 5  . aspirin EC 81 MG tablet Take 1 tablet (81 mg total) by mouth daily. 100 tablet 3  . BYSTOLIC 10 MG tablet take 2 tablets by mouth once daily 60 tablet 11  . CARAFATE 1 GM/10ML suspension TAKE 2 TEASPOONFUL BY MOUTH TWICE A DAY 420 mL 1  . diclofenac sodium (VOLTAREN) 1 % GEL apply 4 grams to affected area four times a day 200 g 4  . dorzolamide-timolol (COSOPT) 22.3-6.8 MG/ML ophthalmic solution Place 1 drop into both eyes 3 (three) times daily.      Marland Kitchen ezetimibe (ZETIA) 10 MG tablet take 1 tablet by mouth once daily 30 tablet 5  . fexofenadine (ALLEGRA) 180 MG tablet Take 180 mg by mouth daily.      Marland Kitchen FLOVENT HFA 110 MCG/ACT inhaler Inhale 2 puffs into the lungs 2 (two) times daily.  0  . furosemide (LASIX) 40 MG tablet Take 1 tablet (40 mg total) by mouth daily. 30 tablet 3  . HYDROcodone-homatropine (HYCODAN) 5-1.5 MG/5ML syrup take 1 teaspoonful by mouth every 6 hours if needed for cough 118 mL 0  . ipratropium (ATROVENT) 0.06 % nasal spray Place 2 sprays into both nostrils as needed for rhinitis.    Marland Kitchen losartan (COZAAR) 100 MG tablet Take 1 tablet (100 mg total) by mouth daily. 30 tablet 2  . montelukast (SINGULAIR) 10 MG tablet Take 1 tablet (10 mg total) by mouth daily. 30 tablet 11  . Multiple Vitamins-Minerals (MULTIVITAMIN WITH MINERALS)  tablet Take 1 tablet by mouth daily.    . Nebivolol HCl (BYSTOLIC) 20 MG TABS Take 1 tablet (20 mg total) by mouth daily. 30 tablet 5  . omeprazole (PRILOSEC) 40 MG capsule take 1 capsule by mouth once daily 30 capsule 5  . pilocarpine (PILOCAR) 4 % ophthalmic solution Place 1 drop into the right eye at bedtime. Reported on 09/05/2015    . QVAR 80 MCG/ACT inhaler Inhale 2 puffs into the lungs 2 (two) times daily.     No facility-administered medications prior to visit.     ROS Review of Systems  Constitutional: Positive for fatigue. Negative for activity change, appetite change, chills and unexpected weight change.  HENT: Positive for congestion. Negative for mouth sores and sinus pressure.   Eyes: Negative for visual disturbance.  Respiratory: Positive for cough. Negative for chest tightness.   Gastrointestinal: Negative for abdominal pain and nausea.  Genitourinary: Negative for difficulty urinating, frequency and vaginal pain.  Musculoskeletal: Negative for back pain and gait problem.  Skin: Negative for pallor and rash.  Neurological: Negative for dizziness, tremors, weakness, numbness and headaches.  Psychiatric/Behavioral: Negative for confusion and sleep disturbance.    Objective:  BP (!) 142/72 (BP Location: Left Arm, Patient Position: Sitting, Cuff Size:  Normal)   Pulse (!) 47   Temp (!) 97.5 F (36.4 C) (Oral)   Ht 5' (1.524 m)   Wt 164 lb (74.4 kg)   SpO2 98%   BMI 32.03 kg/m   BP Readings from Last 3 Encounters:  03/19/17 (!) 142/72  02/17/17 138/78  01/06/17 138/80    Wt Readings from Last 3 Encounters:  03/19/17 164 lb (74.4 kg)  02/17/17 167 lb (75.8 kg)  01/06/17 168 lb (76.2 kg)    Physical Exam  Constitutional: She appears well-developed. No distress.  HENT:  Head: Normocephalic.  Right Ear: External ear normal.  Left Ear: External ear normal.  Nose: Nose normal.  Mouth/Throat: Oropharynx is clear and moist.  Eyes: Conjunctivae are normal. Pupils  are equal, round, and reactive to light. Right eye exhibits no discharge. Left eye exhibits no discharge.  Neck: Normal range of motion. Neck supple. No JVD present. No tracheal deviation present. No thyromegaly present.  Cardiovascular: Normal rate, regular rhythm and normal heart sounds.  Pulmonary/Chest: No stridor. No respiratory distress. She has wheezes.  Abdominal: Soft. Bowel sounds are normal. She exhibits no distension and no mass. There is no tenderness. There is no rebound and no guarding.  Musculoskeletal: She exhibits no edema or tenderness.  Lymphadenopathy:    She has no cervical adenopathy.  Neurological: She displays normal reflexes. No cranial nerve deficit. She exhibits normal muscle tone. Coordination normal.  Skin: No rash noted. No erythema.  Psychiatric: She has a normal mood and affect. Her behavior is normal. Judgment and thought content normal.    Lab Results  Component Value Date   WBC 7.0 01/06/2017   HGB 12.3 01/06/2017   HCT 38.4 01/06/2017   PLT 261.0 01/06/2017   GLUCOSE 96 02/17/2017   CHOL 177 01/06/2017   TRIG 66.0 01/06/2017   HDL 59.50 01/06/2017   LDLCALC 104 (H) 01/06/2017   ALT 9 01/06/2017   AST 14 01/06/2017   NA 142 02/17/2017   K 4.1 02/17/2017   CL 107 02/17/2017   CREATININE 0.74 02/17/2017   BUN 11 02/17/2017   CO2 29 02/17/2017   TSH 1.94 01/06/2017    Dg Chest 2 View  Result Date: 02/17/2017 CLINICAL DATA:  Cough and congestion for several weeks EXAM: CHEST  2 VIEW COMPARISON:  03/02/2012 FINDINGS: The heart size and mediastinal contours are within normal limits. Both lungs are clear. The visualized skeletal structures are unremarkable. IMPRESSION: No active cardiopulmonary disease. Electronically Signed   By: Alcide Clever M.D.   On: 02/17/2017 14:40    Assessment & Plan:   There are no diagnoses linked to this encounter. I have discontinued Curt Bears. Yassin's QVAR. I am also having her maintain her fexofenadine,  dorzolamide-timolol, pilocarpine, multivitamin with minerals, albuterol, Nebivolol HCl, aspirin EC, diclofenac sodium, omeprazole, ipratropium, CARAFATE, acetaminophen, FLOVENT HFA, amLODipine, BYSTOLIC, montelukast, ezetimibe, HYDROcodone-homatropine, losartan, and furosemide.  No orders of the defined types were placed in this encounter.    Follow-up: No Follow-up on file.  Sonda Primes, MD

## 2017-03-19 NOTE — Assessment & Plan Note (Signed)
Depomedrol 80 mg Cont MDIs

## 2017-03-19 NOTE — Assessment & Plan Note (Signed)
Cefdinir Tussionex prn

## 2017-03-19 NOTE — Addendum Note (Signed)
Addended by: Karren Cobble on: 03/19/2017 11:29 AM   Modules accepted: Orders

## 2017-03-24 ENCOUNTER — Ambulatory Visit: Payer: Self-pay

## 2017-03-24 NOTE — Telephone Encounter (Signed)
D/c Cefdinir Imodium prn Use Activia Thx

## 2017-03-24 NOTE — Telephone Encounter (Signed)
Please advise 

## 2017-03-24 NOTE — Telephone Encounter (Signed)
Patient called in with c/o "diarrhea." She says she was "started on cefdinir for a bacterial infection and the paper from the pharmacy said it can cause diarrhea, black or red. I had black diarrhea this morning around 4 am." When asked did she have abdominal pain or cramping, she said "no." I asked is she still having diarrhea, she said "no, because I took some liquid imodium AD I had her at the house and it stopped the diarrhea." She says she has 10 pills left and her last dose was yesterday, 03/23/17. She said "I am feeling much better and the medicine worked, but since it caused diarrhea, I'm not going to take anymore and can you let the doctor know." She was informed the doctor would be made aware and she would hear back within 24 hours according to the care advice given, she verbalized understanding.   Reason for Disposition . Caller has NON-URGENT medication question about med that PCP prescribed and triager unable to answer question  Answer Assessment - Initial Assessment Questions 1. SYMPTOMS: "Do you have any symptoms?"     Diarrhea 2. SEVERITY: If symptoms are present, ask "Are they mild, moderate or severe?"     Mild  Protocols used: MEDICATION QUESTION CALL-A-AH

## 2017-03-24 NOTE — Telephone Encounter (Signed)
Unable to reach pt

## 2017-03-25 NOTE — Telephone Encounter (Signed)
Pt.notified

## 2017-03-31 DIAGNOSIS — N8189 Other female genital prolapse: Secondary | ICD-10-CM | POA: Diagnosis not present

## 2017-03-31 DIAGNOSIS — N8111 Cystocele, midline: Secondary | ICD-10-CM | POA: Diagnosis not present

## 2017-04-30 ENCOUNTER — Encounter: Payer: Self-pay | Admitting: Internal Medicine

## 2017-04-30 ENCOUNTER — Ambulatory Visit (INDEPENDENT_AMBULATORY_CARE_PROVIDER_SITE_OTHER): Payer: Medicare Other | Admitting: Internal Medicine

## 2017-04-30 VITALS — BP 156/78 | HR 55 | Temp 98.0°F | Ht 60.0 in | Wt 162.0 lb

## 2017-04-30 DIAGNOSIS — K219 Gastro-esophageal reflux disease without esophagitis: Secondary | ICD-10-CM | POA: Diagnosis not present

## 2017-04-30 DIAGNOSIS — J45909 Unspecified asthma, uncomplicated: Secondary | ICD-10-CM | POA: Diagnosis not present

## 2017-04-30 DIAGNOSIS — E785 Hyperlipidemia, unspecified: Secondary | ICD-10-CM

## 2017-04-30 DIAGNOSIS — K222 Esophageal obstruction: Secondary | ICD-10-CM

## 2017-04-30 DIAGNOSIS — I10 Essential (primary) hypertension: Secondary | ICD-10-CM | POA: Diagnosis not present

## 2017-04-30 MED ORDER — HYDROCODONE-HOMATROPINE 5-1.5 MG/5ML PO SYRP: ORAL_SOLUTION | ORAL | 0 refills | Status: DC

## 2017-04-30 NOTE — Assessment & Plan Note (Signed)
Zetia

## 2017-04-30 NOTE — Progress Notes (Signed)
Subjective:  Patient ID: [name], female    DOB: 1933/11/05  Age: 84 y.o. MRN: [id number]  CC: No chief complaint on file.   HPI [name] presents for asthma w/occ cough F/u HTN  Outpatient Medications Prior to Visit  Medication Sig Dispense Refill  . acetaminophen (TYLENOL) 325 MG tablet Take 650 mg by mouth every 6 (six) hours as needed.    Marland Kitchen albuterol (PROAIR HFA) 108 (90 BASE) MCG/ACT inhaler Inhale 2 puffs into the lungs every 6 (six) hours as needed for wheezing or shortness of breath. 8.5 g 5  . amLODipine (NORVASC) 5 MG tablet take 1 tablet by mouth once daily 30 tablet 5  . aspirin EC 81 MG tablet Take 1 tablet (81 mg total) by mouth daily. 100 tablet 3  . BYSTOLIC 10 MG tablet take 2 tablets by mouth once daily 60 tablet 11  . CARAFATE 1 GM/10ML suspension take 2 teaspoonful by mouth twice a day 420 mL 11  . diclofenac sodium (VOLTAREN) 1 % GEL apply 4 grams to affected area four times a day 200 g 4  . dorzolamide-timolol (COSOPT) 22.3-6.8 MG/ML ophthalmic solution Place 1 drop into both eyes 3 (three) times daily.      Marland Kitchen ezetimibe (ZETIA) 10 MG tablet take 1 tablet by mouth once daily 30 tablet 5  . fexofenadine (ALLEGRA) 180 MG tablet Take 180 mg by mouth daily.      Marland Kitchen FLOVENT HFA 110 MCG/ACT inhaler Inhale 2 puffs into the lungs 2 (two) times daily.  0  . furosemide (LASIX) 40 MG tablet Take 1 tablet (40 mg total) by mouth daily. 30 tablet 3  . HYDROcodone-homatropine (HYCODAN) 5-1.5 MG/5ML syrup take 1 teaspoonful by mouth every 6 hours if needed for cough 118 mL 0  . ipratropium (ATROVENT) 0.06 % nasal spray Place 2 sprays into both nostrils as needed for rhinitis.    Marland Kitchen losartan (COZAAR) 100 MG tablet Take 1 tablet (100 mg total) by mouth daily. 30 tablet 2  . montelukast (SINGULAIR) 10 MG tablet Take 1 tablet (10 mg total) by mouth daily. 30 tablet 11  . Multiple Vitamins-Minerals (MULTIVITAMIN WITH MINERALS) tablet Take 1 tablet by mouth daily.    .  Nebivolol HCl (BYSTOLIC) 20 MG TABS Take 1 tablet (20 mg total) by mouth daily. 30 tablet 5  . omeprazole (PRILOSEC) 40 MG capsule take 1 capsule by mouth once daily 30 capsule 5  . pilocarpine (PILOCAR) 4 % ophthalmic solution Place 1 drop into the right eye at bedtime. Reported on 09/05/2015    . cefdinir (OMNICEF) 300 MG capsule Take 1 capsule (300 mg total) by mouth 2 (two) times daily. 20 capsule 0   No facility-administered medications prior to visit.     ROS Review of Systems  Constitutional: Negative for activity change, appetite change, chills, fatigue and unexpected weight change.  HENT: Positive for congestion. Negative for mouth sores and sinus pressure.   Eyes: Negative for visual disturbance.  Respiratory: Positive for cough. Negative for chest tightness.   Gastrointestinal: Negative for abdominal pain and nausea.  Genitourinary: Negative for difficulty urinating, frequency and vaginal pain.  Musculoskeletal: Negative for back pain and gait problem.  Skin: Negative for pallor and rash.  Neurological: Negative for dizziness, tremors, weakness, numbness and headaches.  Psychiatric/Behavioral: Negative for confusion, sleep disturbance and suicidal ideas. The patient is not nervous/anxious.     Objective:  BP (!) 156/78 (BP Location: Left Arm, Patient Position: Sitting, Cuff Size:  Large)   Pulse (!) 55   Temp 98 F (36.7 C) (Oral)   Ht 5' (1.524 m)   Wt 162 lb (73.5 kg)   SpO2 99%   BMI 31.64 kg/m   BP Readings from Last 3 Encounters:  04/30/17 (!) 156/78  03/19/17 (!) 142/72  02/17/17 138/78    Wt Readings from Last 3 Encounters:  04/30/17 162 lb (73.5 kg)  03/19/17 164 lb (74.4 kg)  02/17/17 167 lb (75.8 kg)    Physical Exam  Constitutional: She appears well-developed. No distress.  HENT:  Head: Normocephalic.  Right Ear: External ear normal.  Left Ear: External ear normal.  Nose: Nose normal.  Mouth/Throat: Oropharynx is clear and moist.  Eyes:  Conjunctivae are normal. Pupils are equal, round, and reactive to light. Right eye exhibits no discharge. Left eye exhibits no discharge.  Neck: Normal range of motion. Neck supple. No JVD present. No tracheal deviation present. No thyromegaly present.  Cardiovascular: Normal rate, regular rhythm and normal heart sounds.  Pulmonary/Chest: No stridor. No respiratory distress. She has no wheezes.  Abdominal: Soft. Bowel sounds are normal. She exhibits no distension and no mass. There is no tenderness. There is no rebound and no guarding.  Musculoskeletal: She exhibits no edema or tenderness.  Lymphadenopathy:    She has no cervical adenopathy.  Neurological: She displays normal reflexes. No cranial nerve deficit. She exhibits normal muscle tone. Coordination normal.  Skin: No rash noted. No erythema.  Psychiatric: She has a normal mood and affect. Her behavior is normal. Judgment and thought content normal.  mild rhonchi  Lab Results  Component Value Date   WBC 7.0 01/06/2017   HGB 12.3 01/06/2017   HCT 38.4 01/06/2017   PLT 261.0 01/06/2017   GLUCOSE 96 02/17/2017   CHOL 177 01/06/2017   TRIG 66.0 01/06/2017   HDL 59.50 01/06/2017   LDLCALC 104 (H) 01/06/2017   ALT 9 01/06/2017   AST 14 01/06/2017   NA 142 02/17/2017   K 4.1 02/17/2017   CL 107 02/17/2017   CREATININE 0.74 02/17/2017   BUN 11 02/17/2017   CO2 29 02/17/2017   TSH 1.94 01/06/2017    Dg Chest 2 View  Result Date: 02/17/2017 CLINICAL DATA:  Cough and congestion for several weeks EXAM: CHEST  2 VIEW COMPARISON:  03/02/2012 FINDINGS: The heart size and mediastinal contours are within normal limits. Both lungs are clear. The visualized skeletal structures are unremarkable. IMPRESSION: No active cardiopulmonary disease. Electronically Signed   By: Alcide Clever M.D.   On: 02/17/2017 14:40    Assessment & Plan:   There are no diagnoses linked to this encounter. I have discontinued [name]'s cefdinir. I am  also having her maintain her fexofenadine, dorzolamide-timolol, pilocarpine, multivitamin with minerals, albuterol, Nebivolol HCl, aspirin EC, diclofenac sodium, omeprazole, ipratropium, acetaminophen, FLOVENT HFA, amLODipine, BYSTOLIC, montelukast, ezetimibe, losartan, furosemide, CARAFATE, and HYDROcodone-homatropine.  No orders of the defined types were placed in this encounter.    Follow-up: No Follow-up on file.  Sonda Primes, MD

## 2017-04-30 NOTE — Assessment & Plan Note (Signed)
Losartan, Bystolic, Furosemide, Amlodipine 5 mg 

## 2017-04-30 NOTE — Assessment & Plan Note (Signed)
Xopenex prn, Qvar Tussionex prn - rare use  Potential benefits of a long term opioids use as well as potential risks (i.e. addiction risk, apnea etc) and complications (i.e. Somnolence, constipation and others) were explained to the patient and were aknowledged.

## 2017-04-30 NOTE — Assessment & Plan Note (Signed)
Omeprazole, Carafate

## 2017-05-15 ENCOUNTER — Other Ambulatory Visit: Payer: Self-pay | Admitting: Internal Medicine

## 2017-05-20 NOTE — Progress Notes (Signed)
Subjective:    Patient ID: [name], female    DOB: 1933-09-13, 84 y.o.   MRN: [id number]  HPI The patient is here for an acute visit for allergy and asthma problems.  She is a long history of allergies and asthma.  She follows with an allergist.  Over the past several months she feels her allergy and asthma have been worse.  She has been having more symptoms, but they are somewhat intermittent.  Nighttime seems to be worse.  Initially she thought it was her GERD, but had an EGD and that did not seem to be the problem.  She currently does not feel well and she was not able to get in with her allergist.  She states mild nasal congestion, a dry cough that occasionally she will bring up some clear sputum, shortness of breath and wheezing at times.  She is not experiencing cold symptoms and denies fever, chills, ear pain, postnasal drip, sinus pain, sneezing, sore throat and ocular symptoms.  Taking singulair, allegra, nasal spray - ipratopium, Qvar or Flovent twice daily, albuterol prn.  The albuterol helps.     Medications and allergies reviewed with patient and updated if appropriate.  Patient Active Problem List   Diagnosis Date Noted  . Cough 02/17/2017  . Impacted cerumen of right ear 06/04/2015  . Carotid artery stenosis, asymptomatic, bilateral 12/06/2014  . Acute sinusitis 03/02/2014  . Abdominal mass 10/26/2013  . Dyspnea 03/28/2012  . Murmur 03/28/2012  . Need for prophylactic vaccination with tetanus-diphtheria (Td) 08/24/2011  . GERD with stricture 02/09/2011  . Well adult exam 11/13/2010  . CHRONIC LARYNGITIS 12/09/2009  . Acute bronchitis 08/06/2009  . RECTAL BLEEDING 10/23/2008  . Diverticulosis of colon (without mention of hemorrhage) 12/27/2007  . Glaucoma 12/12/2007  . DIVERTICULAR BLEEDING, HX OF 12/12/2007  . Dyslipidemia 01/20/2007  . Anxiety state 01/20/2007  . Essential hypertension 01/20/2007  . Asthma 01/20/2007  . DEGENERATIVE JOINT DISEASE  01/20/2007  . Allergic rhinitis 01/20/2007  . HYSTERECTOMY, HX OF 01/20/2007    Current Outpatient Medications on File Prior to Visit  Medication Sig Dispense Refill  . acetaminophen (TYLENOL) 325 MG tablet Take 650 mg by mouth every 6 (six) hours as needed.    Marland Kitchen albuterol (PROAIR HFA) 108 (90 BASE) MCG/ACT inhaler Inhale 2 puffs into the lungs every 6 (six) hours as needed for wheezing or shortness of breath. 8.5 g 5  . amLODipine (NORVASC) 5 MG tablet TAKE 1 TABLET BY MOUTH ONCE DAILY 90 tablet 1  . aspirin EC 81 MG tablet Take 1 tablet (81 mg total) by mouth daily. 100 tablet 3  . BYSTOLIC 10 MG tablet take 2 tablets by mouth once daily 60 tablet 11  . CARAFATE 1 GM/10ML suspension take 2 teaspoonful by mouth twice a day 420 mL 11  . diclofenac sodium (VOLTAREN) 1 % GEL apply 4 grams to affected area four times a day 200 g 4  . dorzolamide-timolol (COSOPT) 22.3-6.8 MG/ML ophthalmic solution Place 1 drop into both eyes 3 (three) times daily.      Marland Kitchen ezetimibe (ZETIA) 10 MG tablet take 1 tablet by mouth once daily 30 tablet 5  . fexofenadine (ALLEGRA) 180 MG tablet Take 180 mg by mouth daily.      Marland Kitchen FLOVENT HFA 110 MCG/ACT inhaler Inhale 2 puffs into the lungs 2 (two) times daily.  0  . furosemide (LASIX) 40 MG tablet Take 1 tablet (40 mg total) by mouth daily. 30 tablet 3  .  HYDROcodone-homatropine (HYCODAN) 5-1.5 MG/5ML syrup take 1 teaspoonful by mouth every 6 hours if needed for cough 118 mL 0  . ipratropium (ATROVENT) 0.06 % nasal spray Place 2 sprays into both nostrils as needed for rhinitis.    Marland Kitchen losartan (COZAAR) 100 MG tablet Take 1 tablet (100 mg total) by mouth daily. 30 tablet 2  . montelukast (SINGULAIR) 10 MG tablet Take 1 tablet (10 mg total) by mouth daily. 30 tablet 11  . Multiple Vitamins-Minerals (MULTIVITAMIN WITH MINERALS) tablet Take 1 tablet by mouth daily.    . Nebivolol HCl (BYSTOLIC) 20 MG TABS Take 1 tablet (20 mg total) by mouth daily. 30 tablet 5  . omeprazole  (PRILOSEC) 40 MG capsule take 1 capsule by mouth once daily 30 capsule 5  . pilocarpine (PILOCAR) 4 % ophthalmic solution Place 1 drop into the right eye at bedtime. Reported on 09/05/2015     No current facility-administered medications on file prior to visit.     Past Medical History:  Diagnosis Date  . Allergy   . Allergy, unspecified not elsewhere classified   . Anxiety   . Asthma   . Cataract    bil cataracts removed  . Diverticulitis   . Diverticulosis of colon (without mention of hemorrhage)   . DJD (degenerative joint disease)   . Gastritis   . GERD (gastroesophageal reflux disease)   . Glaucoma   . History of GI diverticular bleed   . HTN (hypertension)   . Hyperlipidemia   . LPRD (laryngopharyngeal reflux disease)     Past Surgical History:  Procedure Laterality Date  . BLADDER REPAIR     after perforation  . cataract surgery  2011   w/ IOL Nile Riggs)  . COLONOSCOPY    . KIDNEY SURGERY    . KNEE ARTHROSCOPY  1998   left  . TOTAL ABDOMINAL HYSTERECTOMY     fibroid tumor  . UPPER GASTROINTESTINAL ENDOSCOPY    . WISDOM TOOTH EXTRACTION      Social History   Socioeconomic History  . Marital status: Widowed    Spouse name: None  . Number of children: 4  . Years of education: None  . Highest education level: None  Social Needs  . Financial resource strain: None  . Food insecurity - worry: None  . Food insecurity - inability: None  . Transportation needs - medical: None  . Transportation needs - non-medical: None  Occupational History  . Occupation: housekeeper for [employer]  Tobacco Use  . Smoking status: Never Smoker  . Smokeless tobacco: Never Used  Substance and Sexual Activity  . Alcohol use: No  . Drug use: No  . Sexual activity: Not Currently  Other Topics Concern  . None  Social History Narrative   HSG. Married '56 - '87, widowed. 2 sons > '53, '62. 2 daughters > '59, '61. 9 granddaughters. 12 great-grandchildren. Lives inown home and son  lives with her. She works 3 days a week for [employer].   ACP - asked but not answered (June '14). Referred to [email or web address]    Family History  Problem Relation Age of Onset  . Heart disease Father        ??  . Other Mother        bladder tumor  . Arthritis Mother   . Colon cancer Neg Hx   . Esophageal cancer Neg Hx   . Pancreatic cancer Neg Hx   . Prostate cancer Neg Hx   . Rectal cancer Neg  Hx   . Stomach cancer Neg Hx     Review of Systems  Constitutional: Negative for chills and fever.  HENT: Positive for congestion (little). Negative for ear pain, postnasal drip, sinus pressure, sinus pain, sneezing and sore throat.   Eyes: Negative for redness and itching.  Respiratory: Positive for cough (dry, occ brings up clear phlegm), shortness of breath and wheezing (at times).        Objective:   Vitals:   05/21/17 1049  BP: (!) 164/80  Pulse: (!) 59  Resp: 16  Temp: (!) 97.5 F (36.4 C)  SpO2: 96%   Wt Readings from Last 3 Encounters:  05/21/17 163 lb (73.9 kg)  04/30/17 162 lb (73.5 kg)  03/19/17 164 lb (74.4 kg)   Body mass index is 31.83 kg/m.   Physical Exam  Constitutional: She appears well-developed and well-nourished. No distress.  HENT:  Head: Normocephalic and atraumatic.  Right Ear: External ear normal.  Left Ear: External ear normal.  Mouth/Throat: Oropharynx is clear and moist.  Bilateral ear canals and tympanic membranes normal  Eyes: Conjunctivae are normal.  Neck: Neck supple. No thyromegaly present.  Cardiovascular: Normal rate and regular rhythm.  Pulmonary/Chest: Effort normal. No respiratory distress. She has wheezes (Diffuse expiratory wheeze). She has no rales.  Musculoskeletal: She exhibits no edema.  Lymphadenopathy:    She has no cervical adenopathy.  Skin: Skin is warm and dry. She is not diaphoretic.           Assessment & Plan:    See Problem List for Assessment and Plan of chronic medical problems.

## 2017-05-21 ENCOUNTER — Ambulatory Visit (INDEPENDENT_AMBULATORY_CARE_PROVIDER_SITE_OTHER): Payer: Medicare Other | Admitting: Internal Medicine

## 2017-05-21 ENCOUNTER — Telehealth: Payer: Self-pay | Admitting: Internal Medicine

## 2017-05-21 ENCOUNTER — Encounter: Payer: Self-pay | Admitting: Internal Medicine

## 2017-05-21 VITALS — BP 164/80 | HR 59 | Temp 97.5°F | Resp 16 | Wt 163.0 lb

## 2017-05-21 DIAGNOSIS — J4541 Moderate persistent asthma with (acute) exacerbation: Secondary | ICD-10-CM

## 2017-05-21 MED ORDER — METHYLPREDNISOLONE ACETATE 40 MG/ML IJ SUSP
40.0000 mg | Freq: Once | INTRAMUSCULAR | Status: AC
  Administered 2017-05-21: 40 mg via INTRAMUSCULAR

## 2017-05-21 MED ORDER — PREDNISONE 10 MG PO TABS: ORAL_TABLET | ORAL | 0 refills | Status: DC

## 2017-05-21 NOTE — Telephone Encounter (Signed)
Patient has dropped off a renewal parking placard. Forms have been filled out and given to provider to sign.

## 2017-05-21 NOTE — Assessment & Plan Note (Signed)
She is diffuse wheezing throughout her lungs and she is having an asthma flare No obvious bacterial infection so no need for antibiotics She had similar symptoms last fall that was treated by her allergist with steroids and she did well Depo-Medrol 40 mg IM x1 Short steroid taper-prednisone 40 mg, 30 mg, 20 mg, 10 mg Continue allergy and asthma medications Advised her to follow-up with her allergist when able

## 2017-05-21 NOTE — Patient Instructions (Addendum)
You received a steroid injection today.   Take the steroid taper starting tomorrow as prescribed.   Continue all your allergy and asthma   Follow up with your allergist.

## 2017-06-07 NOTE — Telephone Encounter (Signed)
Form have been signed, copy sent to scan & Original to patient.

## 2017-06-11 DIAGNOSIS — H401132 Primary open-angle glaucoma, bilateral, moderate stage: Secondary | ICD-10-CM | POA: Diagnosis not present

## 2017-06-14 ENCOUNTER — Other Ambulatory Visit: Payer: Self-pay | Admitting: Internal Medicine

## 2017-06-17 ENCOUNTER — Other Ambulatory Visit: Payer: Self-pay | Admitting: Internal Medicine

## 2017-06-17 ENCOUNTER — Telehealth: Payer: Self-pay | Admitting: Internal Medicine

## 2017-06-17 NOTE — Telephone Encounter (Signed)
Copied from [place]. Topic: Quick Communication - See Telephone Encounter >> Jun 17, 2017  1:18 PM Ether Griffins B wrote: CRM for notification. See Telephone encounter for: 06/17/17. Pt is needing a PA on Bystolic. She is out of her medication and is needing it called in. Pt states the pharmacy is sending something over as well to the office. Pt would like a call as to the process of this and what steps need to take place.

## 2017-06-18 NOTE — Telephone Encounter (Signed)
Pt calling to check on status of PA for Bystolic. She is out of medication and worried going into the weekend. Pt is asking for a return call at [phone].

## 2017-06-21 NOTE — Telephone Encounter (Signed)
Patient has call back about this.  Has this been started? I explained that Dr Alain Marion was out of the office on Thursday and Friday of last week. Please follow up with patient.

## 2017-06-21 NOTE — Telephone Encounter (Signed)
[name] has samples for patient to pick up until PA is approved.

## 2017-06-21 NOTE — Telephone Encounter (Signed)
Pt was notified that pcp was out of office last week, but pt would like to be contacted about the status of the Rx, call pt to advise

## 2017-06-21 NOTE — Telephone Encounter (Signed)
Call to office- they are going to follow up on that today- patient notified.

## 2017-06-24 DIAGNOSIS — J3089 Other allergic rhinitis: Secondary | ICD-10-CM | POA: Diagnosis not present

## 2017-06-24 DIAGNOSIS — J301 Allergic rhinitis due to pollen: Secondary | ICD-10-CM | POA: Diagnosis not present

## 2017-06-24 DIAGNOSIS — J4530 Mild persistent asthma, uncomplicated: Secondary | ICD-10-CM | POA: Diagnosis not present

## 2017-07-06 ENCOUNTER — Telehealth: Payer: Self-pay

## 2017-07-06 NOTE — Telephone Encounter (Signed)
I called pharmacy and spoke with Ronalee Belts. He stated patient's Bystolic was resolved and she was able to pick up her 20 mg tablet now since it was previously on back order and that patient was all set to go.

## 2017-07-16 ENCOUNTER — Other Ambulatory Visit: Payer: Self-pay | Admitting: Internal Medicine

## 2017-07-22 ENCOUNTER — Telehealth: Payer: Self-pay | Admitting: Internal Medicine

## 2017-07-22 NOTE — Telephone Encounter (Signed)
p peCopied from [place] [id number]. Topic: Quick Communication - See Telephone Encounter >> Jul 22, 2017 11:22 AM Bea Graff, NT wrote: CRM for notification. See Telephone encounter for: 07/22/17. Pt calling and states that the pharmacy told her that her insurance will not pay for the CARAFATE 1 GM/10ML suspension and patient is wanting to see if a PA is needed for the medication. She states every month she is having trouble with getting the medication.

## 2017-08-03 DIAGNOSIS — N8189 Other female genital prolapse: Secondary | ICD-10-CM | POA: Diagnosis not present

## 2017-08-03 DIAGNOSIS — Z4689 Encounter for fitting and adjustment of other specified devices: Secondary | ICD-10-CM | POA: Diagnosis not present

## 2017-08-03 DIAGNOSIS — Z466 Encounter for fitting and adjustment of urinary device: Secondary | ICD-10-CM | POA: Diagnosis not present

## 2017-08-03 NOTE — Telephone Encounter (Signed)
Key: [id number])

## 2017-08-03 NOTE — Telephone Encounter (Signed)
Patient called back checking on the status of this. Please advise.

## 2017-08-03 NOTE — Telephone Encounter (Signed)
Key: [facility]

## 2017-08-05 NOTE — Telephone Encounter (Signed)
PA approved.

## 2017-08-19 ENCOUNTER — Other Ambulatory Visit: Payer: Self-pay | Admitting: Internal Medicine

## 2017-08-21 ENCOUNTER — Other Ambulatory Visit: Payer: Self-pay | Admitting: Internal Medicine

## 2017-08-31 ENCOUNTER — Encounter: Payer: Self-pay | Admitting: Internal Medicine

## 2017-08-31 ENCOUNTER — Ambulatory Visit (INDEPENDENT_AMBULATORY_CARE_PROVIDER_SITE_OTHER): Payer: Medicare Other | Admitting: Internal Medicine

## 2017-08-31 ENCOUNTER — Other Ambulatory Visit (INDEPENDENT_AMBULATORY_CARE_PROVIDER_SITE_OTHER): Payer: Medicare Other

## 2017-08-31 DIAGNOSIS — I10 Essential (primary) hypertension: Secondary | ICD-10-CM

## 2017-08-31 DIAGNOSIS — D1722 Benign lipomatous neoplasm of skin and subcutaneous tissue of left arm: Secondary | ICD-10-CM

## 2017-08-31 DIAGNOSIS — E785 Hyperlipidemia, unspecified: Secondary | ICD-10-CM | POA: Diagnosis not present

## 2017-08-31 DIAGNOSIS — J45909 Unspecified asthma, uncomplicated: Secondary | ICD-10-CM | POA: Diagnosis not present

## 2017-08-31 DIAGNOSIS — D179 Benign lipomatous neoplasm, unspecified: Secondary | ICD-10-CM | POA: Insufficient documentation

## 2017-08-31 LAB — BASIC METABOLIC PANEL
BUN: 10 mg/dL (ref 6–23)
CHLORIDE: 106 meq/L (ref 96–112)
CO2: 31 mEq/L (ref 19–32)
CREATININE: 0.85 mg/dL (ref 0.40–1.20)
Calcium: 9.5 mg/dL (ref 8.4–10.5)
GFR: 82.37 mL/min (ref >60.00)
Glucose, Bld: 95 mg/dL (ref 70–99)
POTASSIUM: 4 meq/L (ref 3.5–5.1)
Sodium: 143 mEq/L (ref 135–145)

## 2017-08-31 MED ORDER — HYDROCODONE-HOMATROPINE 5-1.5 MG/5ML PO SYRP: ORAL_SOLUTION | ORAL | 0 refills | Status: DC

## 2017-08-31 NOTE — Assessment & Plan Note (Signed)
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

## 2017-08-31 NOTE — Progress Notes (Signed)
Subjective:  Patient ID: [name], female    DOB: 1933-12-14  Age: 84 y.o. MRN: [id number]  CC: No chief complaint on file.   HPI YLIANA MEANOR presents for allergies, cough w/weather change F/u asthma, GERD, HTN  Outpatient Medications Prior to Visit  Medication Sig Dispense Refill  . acetaminophen (TYLENOL) 325 MG tablet Take 650 mg by mouth every 6 (six) hours as needed.    Marland Kitchen albuterol (PROAIR HFA) 108 (90 BASE) MCG/ACT inhaler Inhale 2 puffs into the lungs every 6 (six) hours as needed for wheezing or shortness of breath. 8.5 g 5  . amLODipine (NORVASC) 5 MG tablet TAKE 1 TABLET BY MOUTH ONCE DAILY 90 tablet 1  . BYSTOLIC 10 MG tablet take 2 tablets by mouth once daily 60 tablet 11  . CARAFATE 1 GM/10ML suspension take 2 teaspoonful by mouth twice a day 420 mL 11  . diclofenac sodium (VOLTAREN) 1 % GEL apply 4 grams to affected area four times a day 200 g 4  . dorzolamide-timolol (COSOPT) 22.3-6.8 MG/ML ophthalmic solution Place 1 drop into both eyes 3 (three) times daily.      Marland Kitchen ezetimibe (ZETIA) 10 MG tablet TAKE 1 TABLET BY MOUTH ONCE DAILY 30 tablet 11  . fexofenadine (ALLEGRA) 180 MG tablet Take 180 mg by mouth daily.      Marland Kitchen FLOVENT HFA 110 MCG/ACT inhaler Inhale 2 puffs into the lungs 2 (two) times daily.  0  . furosemide (LASIX) 40 MG tablet TAKE 1 TABLET BY MOUTH ONCE DAILY 30 tablet 5  . HYDROcodone-homatropine (HYCODAN) 5-1.5 MG/5ML syrup take 1 teaspoonful by mouth every 6 hours if needed for cough 118 mL 0  . ipratropium (ATROVENT) 0.06 % nasal spray Place 2 sprays into both nostrils as needed for rhinitis.    Marland Kitchen losartan (COZAAR) 100 MG tablet TAKE 1 TABLET BY MOUTH ONCE DAILY 30 tablet 11  . Multiple Vitamins-Minerals (MULTIVITAMIN WITH MINERALS) tablet Take 1 tablet by mouth daily.    . Nebivolol HCl (BYSTOLIC) 20 MG TABS Take 1 tablet (20 mg total) by mouth daily. 30 tablet 5  . omeprazole (PRILOSEC) 40 MG capsule TAKE 1 CAPSULE BY MOUTH ONCE DAILY 30  capsule 5  . pilocarpine (PILOCAR) 4 % ophthalmic solution Place 1 drop into the right eye at bedtime. Reported on 09/05/2015    . predniSONE (DELTASONE) 10 MG tablet 40 mg x 1 day, 30 mg x 1 day, 20 mg x 1 day, 10 mg 10 tablet 0  . montelukast (SINGULAIR) 10 MG tablet Take 1 tablet (10 mg total) by mouth daily. 30 tablet 11   No facility-administered medications prior to visit.     ROS: Review of Systems  Constitutional: Negative for activity change, appetite change, chills, fatigue and unexpected weight change.  HENT: Positive for congestion and rhinorrhea. Negative for mouth sores and sinus pressure.   Eyes: Negative for visual disturbance.  Respiratory: Positive for cough. Negative for chest tightness.   Gastrointestinal: Negative for abdominal pain and nausea.  Genitourinary: Negative for difficulty urinating, frequency and vaginal pain.  Musculoskeletal: Negative for back pain and gait problem.  Skin: Negative for pallor and rash.  Neurological: Negative for dizziness, tremors, weakness, numbness and headaches.  Psychiatric/Behavioral: Negative for confusion and sleep disturbance.    Objective:  BP (!) 148/76 (BP Location: Left Arm, Patient Position: Sitting, Cuff Size: Normal)   Pulse 60   Temp 98.1 F (36.7 C) (Oral)   Ht 5' (1.524 m)  Wt 163 lb (73.9 kg)   SpO2 98%   BMI 31.83 kg/m   BP Readings from Last 3 Encounters:  08/31/17 (!) 148/76  05/21/17 (!) 164/80  04/30/17 (!) 156/78    Wt Readings from Last 3 Encounters:  08/31/17 163 lb (73.9 kg)  05/21/17 163 lb (73.9 kg)  04/30/17 162 lb (73.5 kg)    Physical Exam  Constitutional: She appears well-developed. No distress.  HENT:  Head: Normocephalic.  Right Ear: External ear normal.  Left Ear: External ear normal.  Nose: Nose normal.  Mouth/Throat: Oropharynx is clear and moist.  Eyes: Pupils are equal, round, and reactive to light. Conjunctivae are normal. Right eye exhibits no discharge. Left eye  exhibits no discharge.  Neck: Normal range of motion. Neck supple. No JVD present. No tracheal deviation present. No thyromegaly present.  Cardiovascular: Normal rate, regular rhythm and normal heart sounds.  Pulmonary/Chest: No stridor. No respiratory distress. She has no wheezes.  Abdominal: Soft. Bowel sounds are normal. She exhibits no distension and no mass. There is no tenderness. There is no rebound and no guarding.  Musculoskeletal: She exhibits no edema or tenderness.  Lymphadenopathy:    She has no cervical adenopathy.  Neurological: She displays normal reflexes. No cranial nerve deficit. She exhibits normal muscle tone. Coordination normal.  Skin: No rash noted. No erythema.  Psychiatric: She has a normal mood and affect. Her behavior is normal. Judgment and thought content normal.  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, NT  Lab Results  Component Value Date   WBC 7.0 01/06/2017   HGB 12.3 01/06/2017   HCT 38.4 01/06/2017   PLT 261.0 01/06/2017   GLUCOSE 96 02/17/2017   CHOL 177 01/06/2017   TRIG 66.0 01/06/2017   HDL 59.50 01/06/2017   LDLCALC 104 (H) 01/06/2017   ALT 9 01/06/2017   AST 14 01/06/2017   NA 142 02/17/2017   K 4.1 02/17/2017   CL 107 02/17/2017   CREATININE 0.74 02/17/2017   BUN 11 02/17/2017   CO2 29 02/17/2017   TSH 1.94 01/06/2017    Dg Chest 2 View  Result Date: 02/17/2017 CLINICAL DATA:  Cough and congestion for several weeks EXAM: CHEST  2 VIEW COMPARISON:  03/02/2012 FINDINGS: The heart size and mediastinal contours are within normal limits. Both lungs are clear. The visualized skeletal structures are unremarkable. IMPRESSION: No active cardiopulmonary disease. Electronically Signed   By: Alcide Clever M.D.   On: 02/17/2017 14:40    Assessment & Plan:   There are no diagnoses linked to this encounter.   No orders of the defined types were placed in this encounter.    Follow-up: No follow-ups on file.  Sonda Primes, MD

## 2017-08-31 NOTE — Patient Instructions (Signed)
MC well w/Jill 

## 2017-08-31 NOTE — Assessment & Plan Note (Signed)
Tussionex prn Singulair  - d/c

## 2017-08-31 NOTE — Assessment & Plan Note (Signed)
Zetia

## 2017-08-31 NOTE — Assessment & Plan Note (Signed)
Losartan, Bystolic, Furosemide, Amlodipine  

## 2017-09-02 ENCOUNTER — Other Ambulatory Visit: Payer: Self-pay | Admitting: Internal Medicine

## 2017-09-02 NOTE — Telephone Encounter (Signed)
Copied from [place] [id number]. Topic: Quick Communication - See Telephone Encounter >> Sep 02, 2017 11:12 AM Scherrie Gerlach wrote: Reason for CRM: pt just seen 08/31/17, but states today she is having severe allergy issues.  Pt has 2 inhalers that she uses, and only uses as directed. Pt wants to know if there is anything else the dr can do or prescribed. Pt states worse when she has to walk a distance.  Pt states she does not feel up to coming in today, and she was just there, even though she was not having those symptoms at that visit.  But states dr aware of her breathing issues. Pt states prednisone has helped in the past, and hopes dr will send in for her.  Also if she can or should use her inhalers more often, as pt states she always follows the directions, but they are not helping at this time   [id number] - [place], [address] AT [address] [phone] (Phone) [phone] (Fax)

## 2017-09-02 NOTE — Telephone Encounter (Signed)
Please advise 

## 2017-09-03 ENCOUNTER — Ambulatory Visit: Payer: Medicare Other

## 2017-09-03 MED ORDER — METHYLPREDNISOLONE 4 MG PO TBPK: ORAL_TABLET | ORAL | 0 refills | Status: DC

## 2017-09-03 NOTE — Telephone Encounter (Signed)
Ok Medrol pack - sent Thx

## 2017-09-03 NOTE — Telephone Encounter (Signed)
Pt informed rx was sent to [facility].

## 2017-09-14 DIAGNOSIS — H401132 Primary open-angle glaucoma, bilateral, moderate stage: Secondary | ICD-10-CM | POA: Diagnosis not present

## 2017-09-14 DIAGNOSIS — H18421 Band keratopathy, right eye: Secondary | ICD-10-CM | POA: Diagnosis not present

## 2017-09-21 DIAGNOSIS — H401131 Primary open-angle glaucoma, bilateral, mild stage: Secondary | ICD-10-CM | POA: Diagnosis not present

## 2017-10-04 DIAGNOSIS — J4530 Mild persistent asthma, uncomplicated: Secondary | ICD-10-CM | POA: Diagnosis not present

## 2017-10-04 DIAGNOSIS — J3089 Other allergic rhinitis: Secondary | ICD-10-CM | POA: Diagnosis not present

## 2017-10-04 DIAGNOSIS — R05 Cough: Secondary | ICD-10-CM | POA: Diagnosis not present

## 2017-10-04 DIAGNOSIS — J301 Allergic rhinitis due to pollen: Secondary | ICD-10-CM | POA: Diagnosis not present

## 2017-10-05 DIAGNOSIS — H401131 Primary open-angle glaucoma, bilateral, mild stage: Secondary | ICD-10-CM | POA: Diagnosis not present

## 2017-10-21 ENCOUNTER — Other Ambulatory Visit: Payer: Self-pay | Admitting: Internal Medicine

## 2017-10-21 DIAGNOSIS — I6523 Occlusion and stenosis of bilateral carotid arteries: Secondary | ICD-10-CM

## 2017-10-27 ENCOUNTER — Ambulatory Visit (HOSPITAL_COMMUNITY)
Admission: RE | Admit: 2017-10-27 | Discharge: 2017-10-27 | Disposition: A | Discharge status: Home / Self Care | Payer: Medicare Other | Source: Ambulatory Visit | Attending: Cardiology | Admitting: Cardiology

## 2017-10-27 DIAGNOSIS — I6523 Occlusion and stenosis of bilateral carotid arteries: Secondary | ICD-10-CM | POA: Diagnosis not present

## 2017-11-09 ENCOUNTER — Other Ambulatory Visit: Payer: Self-pay | Admitting: Internal Medicine

## 2017-11-23 DIAGNOSIS — H18421 Band keratopathy, right eye: Secondary | ICD-10-CM | POA: Diagnosis not present

## 2017-11-24 ENCOUNTER — Telehealth: Payer: Self-pay | Admitting: Internal Medicine

## 2017-11-24 MED ORDER — AMLODIPINE BESYLATE 5 MG PO TABS: 5.0000 mg | ORAL_TABLET | Freq: Every day | ORAL | 1 refills | Status: DC

## 2017-11-24 NOTE — Telephone Encounter (Signed)
Copied from [place]. Topic: Quick Communication - Rx Refill/Question >> Nov 24, 2017  9:42 AM Yvette Rack wrote: Medication: amLODipine (NORVASC) 5 MG tablet  Has the patient contacted their pharmacy? yes   Preferred Pharmacy (with phone number or street name): [facility] [phone] - [place], [place] - [phone] (Phone) [phone] (Fax)  Agent: Please be advised that RX refills may take up to 3 business days. We ask that you follow-up with your pharmacy.

## 2017-12-08 DIAGNOSIS — N8189 Other female genital prolapse: Secondary | ICD-10-CM | POA: Diagnosis not present

## 2017-12-08 DIAGNOSIS — N951 Menopausal and female climacteric states: Secondary | ICD-10-CM | POA: Diagnosis not present

## 2017-12-08 DIAGNOSIS — Z960 Presence of urogenital implants: Secondary | ICD-10-CM | POA: Diagnosis not present

## 2017-12-08 DIAGNOSIS — B373 Candidiasis of vulva and vagina: Secondary | ICD-10-CM | POA: Diagnosis not present

## 2017-12-08 DIAGNOSIS — Z01419 Encounter for gynecological examination (general) (routine) without abnormal findings: Secondary | ICD-10-CM | POA: Diagnosis not present

## 2017-12-14 DIAGNOSIS — M1711 Unilateral primary osteoarthritis, right knee: Secondary | ICD-10-CM | POA: Diagnosis not present

## 2017-12-21 ENCOUNTER — Ambulatory Visit (INDEPENDENT_AMBULATORY_CARE_PROVIDER_SITE_OTHER): Payer: Medicare Other | Admitting: Internal Medicine

## 2017-12-21 ENCOUNTER — Encounter: Payer: Self-pay | Admitting: Internal Medicine

## 2017-12-21 VITALS — BP 132/80 | HR 53 | Temp 97.7°F | Ht 60.0 in | Wt 166.0 lb

## 2017-12-21 DIAGNOSIS — K219 Gastro-esophageal reflux disease without esophagitis: Secondary | ICD-10-CM

## 2017-12-21 DIAGNOSIS — K222 Esophageal obstruction: Secondary | ICD-10-CM | POA: Diagnosis not present

## 2017-12-21 DIAGNOSIS — M25561 Pain in right knee: Secondary | ICD-10-CM

## 2017-12-21 DIAGNOSIS — Z23 Encounter for immunization: Secondary | ICD-10-CM

## 2017-12-21 DIAGNOSIS — I6523 Occlusion and stenosis of bilateral carotid arteries: Secondary | ICD-10-CM

## 2017-12-21 DIAGNOSIS — G8929 Other chronic pain: Secondary | ICD-10-CM | POA: Insufficient documentation

## 2017-12-21 DIAGNOSIS — R29898 Other symptoms and signs involving the musculoskeletal system: Secondary | ICD-10-CM | POA: Insufficient documentation

## 2017-12-21 DIAGNOSIS — I10 Essential (primary) hypertension: Secondary | ICD-10-CM | POA: Diagnosis not present

## 2017-12-21 DIAGNOSIS — M25569 Pain in unspecified knee: Secondary | ICD-10-CM

## 2017-12-21 NOTE — Patient Instructions (Signed)
Pes anserina bursitis pain - use ice/heat    Bursitis Bursitis is when the fluid-filled sac (bursa) that covers and protects a joint is swollen (inflamed). Bursitis is most common near joints, especially the knees, elbows, hips, and shoulders. Follow these instructions at home:  Take medicines only as told by your doctor.  If you were prescribed an antibiotic medicine, finish it all even if you start to feel better.  Rest the affected area as told by your doctor. ? Keep the area raised up. ? Avoid doing things that make the pain worse.  Apply ice to the injured area: ? Place ice in a plastic bag. ? Place a towel between your skin and the bag. ? Leave the ice on for 20 minutes, 2-3 times a day.  Use splints, braces, pads, or walking aids as told by your doctor.  Keep all follow-up visits as told by your doctor. This is important. Contact a doctor if:  You have more pain with home care.  You have a fever.  You have chills. This information is not intended to replace advice given to you by your health care provider. Make sure you discuss any questions you have with your health care provider. Document Released: 08/20/2009 Document Revised: 08/08/2015 Document Reviewed: 05/22/2013 Elsevier Interactive Patient Education  2018 [employer].

## 2017-12-21 NOTE — Progress Notes (Signed)
Subjective:  Patient ID: [name], female    DOB: Jan 01, 1934  Age: 84 y.o. MRN: [id number]  CC: No chief complaint on file.   HPI [name] presents for asthma, HTN, OA C/o R knee pain in the back. Pt saw Dr Juliene Pina - she had a shot in the knee last Tue. C/o R hand grip weakness x2-3 mo: hard to write, button up etc  Outpatient Medications Prior to Visit  Medication Sig Dispense Refill  . acetaminophen (TYLENOL) 325 MG tablet Take 650 mg by mouth every 6 (six) hours as needed.    Marland Kitchen amLODipine (NORVASC) 5 MG tablet Take 1 tablet (5 mg total) by mouth daily. 90 tablet 1  . ATROVENT HFA 17 MCG/ACT inhaler INL 2 PFS PO QID  5  . BREO ELLIPTA 200-25 MCG/INH AEPB INL 1 PUFF PO QD  5  . BYSTOLIC 10 MG tablet take 2 tablets by mouth once daily 60 tablet 11  . BYSTOLIC 20 MG TABS TAKE 1 TABLET BY MOUTH EVERY DAY 30 tablet 3  . CARAFATE 1 GM/10ML suspension take 2 teaspoonful by mouth twice a day 420 mL 11  . diclofenac sodium (VOLTAREN) 1 % GEL apply 4 grams to affected area four times a day 200 g 4  . dorzolamide-timolol (COSOPT) 22.3-6.8 MG/ML ophthalmic solution Place 1 drop into both eyes 3 (three) times daily.      Marland Kitchen ezetimibe (ZETIA) 10 MG tablet TAKE 1 TABLET BY MOUTH ONCE DAILY 30 tablet 11  . fexofenadine (ALLEGRA) 180 MG tablet Take 180 mg by mouth daily.      Marland Kitchen FLOVENT HFA 110 MCG/ACT inhaler Inhale 2 puffs into the lungs 2 (two) times daily.  0  . HYDROcodone-homatropine (HYCODAN) 5-1.5 MG/5ML syrup take 1 teaspoonful by mouth every 6 hours if needed for cough 118 mL 0  . ipratropium (ATROVENT) 0.06 % nasal spray Place 2 sprays into both nostrils as needed for rhinitis.    Marland Kitchen latanoprost (XALATAN) 0.005 % ophthalmic solution INT 1 GTT IN OU D  3  . losartan (COZAAR) 100 MG tablet TAKE 1 TABLET BY MOUTH ONCE DAILY 30 tablet 11  . methylPREDNISolone (MEDROL DOSEPAK) 4 MG TBPK tablet As directed 21 tablet 0  . Multiple Vitamins-Minerals (MULTIVITAMIN WITH MINERALS)  tablet Take 1 tablet by mouth daily.    Marland Kitchen omeprazole (PRILOSEC) 40 MG capsule TAKE 1 CAPSULE BY MOUTH ONCE DAILY 30 capsule 5  . pilocarpine (PILOCAR) 4 % ophthalmic solution Place 1 drop into the right eye at bedtime. Reported on 09/05/2015    . prednisoLONE acetate (PRED FORTE) 1 % ophthalmic suspension SHAKE LQ AND INT 1 GTT IN OU QID  0  . predniSONE (DELTASONE) 10 MG tablet 40 mg x 1 day, 30 mg x 1 day, 20 mg x 1 day, 10 mg 10 tablet 0  . albuterol (PROAIR HFA) 108 (90 BASE) MCG/ACT inhaler Inhale 2 puffs into the lungs every 6 (six) hours as needed for wheezing or shortness of breath. 8.5 g 5  . furosemide (LASIX) 40 MG tablet TAKE 1 TABLET BY MOUTH ONCE DAILY 30 tablet 5   No facility-administered medications prior to visit.     ROS: Review of Systems  Constitutional: Negative for activity change, appetite change, chills, fatigue and unexpected weight change.  HENT: Negative for congestion, mouth sores and sinus pressure.   Eyes: Negative for visual disturbance.  Respiratory: Negative for cough and chest tightness.   Gastrointestinal: Negative for abdominal pain and nausea.  Genitourinary: Negative for difficulty urinating, frequency and vaginal pain.  Musculoskeletal: Positive for arthralgias and gait problem. Negative for back pain.  Skin: Negative for pallor and rash.  Neurological: Negative for dizziness, tremors, weakness, numbness and headaches.  Psychiatric/Behavioral: Negative for confusion and sleep disturbance.  R hand grip weakness x2-3 mo: hard to write, button up etc  Objective:  BP 132/80 (BP Location: Left Arm, Patient Position: Sitting, Cuff Size: Normal)   Pulse (!) 53   Temp 97.7 F (36.5 C) (Oral)   Ht 5' (1.524 m)   Wt 166 lb (75.3 kg)   SpO2 98%   BMI 32.42 kg/m   BP Readings from Last 3 Encounters:  12/21/17 132/80  08/31/17 (!) 148/76  05/21/17 (!) 164/80    Wt Readings from Last 3 Encounters:  12/21/17 166 lb (75.3 kg)  08/31/17 163 lb (73.9  kg)  05/21/17 163 lb (73.9 kg)    Physical Exam  Constitutional: She appears well-developed. No distress.  HENT:  Head: Normocephalic.  Right Ear: External ear normal.  Left Ear: External ear normal.  Nose: Nose normal.  Mouth/Throat: Oropharynx is clear and moist.  Eyes: Pupils are equal, round, and reactive to light. Conjunctivae are normal. Right eye exhibits no discharge. Left eye exhibits no discharge.  Neck: Normal range of motion. Neck supple. No JVD present. No tracheal deviation present. No thyromegaly present.  Cardiovascular: Normal rate, regular rhythm and normal heart sounds.  Pulmonary/Chest: No stridor. No respiratory distress. She has no wheezes.  Abdominal: Soft. Bowel sounds are normal. She exhibits no distension and no mass. There is no tenderness. There is no rebound and no guarding.  Musculoskeletal: She exhibits tenderness. She exhibits no edema.  Lymphadenopathy:    She has no cervical adenopathy.  Neurological: She displays normal reflexes. No cranial nerve deficit. She exhibits normal muscle tone. Coordination normal.  Skin: No rash noted. No erythema.  Psychiatric: She has a normal mood and affect. Her behavior is normal. Judgment and thought content normal.   R knee w/pes anserina pain   R hand grip weakness 5-/5. Mild R intraosseous atrophy   Lab Results  Component Value Date   WBC 7.0 01/06/2017   HGB 12.3 01/06/2017   HCT 38.4 01/06/2017   PLT 261.0 01/06/2017   GLUCOSE 95 08/31/2017   CHOL 177 01/06/2017   TRIG 66.0 01/06/2017   HDL 59.50 01/06/2017   LDLCALC 104 (H) 01/06/2017   ALT 9 01/06/2017   AST 14 01/06/2017   NA 143 08/31/2017   K 4.0 08/31/2017   CL 106 08/31/2017   CREATININE 0.85 08/31/2017   BUN 10 08/31/2017   CO2 31 08/31/2017   TSH 1.94 01/06/2017    No results found.  Assessment & Plan:   There are no diagnoses linked to this encounter.   No orders of the defined types were placed in this  encounter.    Follow-up: No follow-ups on file.  Sonda Primes, MD

## 2017-12-21 NOTE — Assessment & Plan Note (Signed)
Omeprazole, Carafate Doing well

## 2017-12-21 NOTE — Assessment & Plan Note (Addendum)
Pes anserina pain - ice/heat Voltaren gel

## 2017-12-21 NOTE — Assessment & Plan Note (Signed)
Losartan, Bystolic, Furosemide, Amlodipine

## 2017-12-21 NOTE — Assessment & Plan Note (Signed)
R hand grip weakness 5-/5. Mild R intraosseous atrophy ?etiology Neurol ref

## 2017-12-23 ENCOUNTER — Encounter: Payer: Self-pay | Admitting: Neurology

## 2017-12-29 ENCOUNTER — Ambulatory Visit: Payer: Medicare Other | Admitting: Internal Medicine

## 2018-01-06 DIAGNOSIS — Z961 Presence of intraocular lens: Secondary | ICD-10-CM | POA: Diagnosis not present

## 2018-01-06 DIAGNOSIS — H401131 Primary open-angle glaucoma, bilateral, mild stage: Secondary | ICD-10-CM | POA: Diagnosis not present

## 2018-01-06 DIAGNOSIS — H2511 Age-related nuclear cataract, right eye: Secondary | ICD-10-CM | POA: Diagnosis not present

## 2018-01-06 DIAGNOSIS — H18421 Band keratopathy, right eye: Secondary | ICD-10-CM | POA: Diagnosis not present

## 2018-02-19 ENCOUNTER — Other Ambulatory Visit: Payer: Self-pay | Admitting: Internal Medicine

## 2018-02-21 ENCOUNTER — Encounter: Payer: Self-pay | Admitting: Neurology

## 2018-02-21 ENCOUNTER — Ambulatory Visit (INDEPENDENT_AMBULATORY_CARE_PROVIDER_SITE_OTHER): Payer: Medicare Other | Admitting: Neurology

## 2018-02-21 VITALS — BP 160/80 | HR 56 | Ht 60.0 in | Wt 164.2 lb

## 2018-02-21 DIAGNOSIS — R29898 Other symptoms and signs involving the musculoskeletal system: Secondary | ICD-10-CM | POA: Diagnosis not present

## 2018-02-21 DIAGNOSIS — I6523 Occlusion and stenosis of bilateral carotid arteries: Secondary | ICD-10-CM | POA: Diagnosis not present

## 2018-02-21 NOTE — Progress Notes (Signed)
[facility] Clinic Note - Initial Visit   Date: 02/21/18  [name] MRN: [id number] DOB: 11/29/1933   Dear Dr. Posey Rea:  Thank you for your kind referral of [name] for consultation of right hand weakness. Although her history is well known to you, please allow [place] to reiterate it for the purpose of our medical record. The patient was accompanied to the clinic by self.    History of Present Illness: [name] is a 84 y.o. right-handed African American female with hypertension, OA, GERD, and hyperlipidemia presenting for evaluation of right hand weakness.  Starting around 2018, she began noticing gradual weakness of the right hand, such as with opening a jar/bottle or fine motor tasks like putting on jewelry. She has some weakness with writing and tends to take her time as to avoid misspellings. She has episodic numbness of the right hand.  She does not have numbness/tingling that wakes her up from sleeping.  She does not have similar symptoms on the left hand or complain of neck pain.  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  Afterwards, she helped care of [facility] home for 30 years and retired at the impressive age of 58.  Out-side paper records, electronic medical record, and images have been reviewed where available and summarized as:  Lab Results  Component Value Date   TSH 1.94 01/06/2017     Past Medical History:  Diagnosis Date  . Allergy   . Allergy, unspecified not elsewhere classified   . Anxiety   . Asthma   . Cataract    bil cataracts removed  . Diverticulitis   . Diverticulosis of colon (without mention of hemorrhage)   . DJD (degenerative joint disease)   . Gastritis   . GERD (gastroesophageal reflux disease)   . Glaucoma   . History of GI diverticular bleed   . HTN (hypertension)   . Hyperlipidemia   . LPRD (laryngopharyngeal reflux disease)     Past Surgical  History:  Procedure Laterality Date  . BLADDER REPAIR     after perforation  . cataract surgery  2011   w/ IOL Nile Riggs)  . COLONOSCOPY    . KIDNEY SURGERY    . KNEE ARTHROSCOPY  1998   left  . TOTAL ABDOMINAL HYSTERECTOMY     fibroid tumor  . UPPER GASTROINTESTINAL ENDOSCOPY    . WISDOM TOOTH EXTRACTION       Medications:  Outpatient Encounter Medications as of 02/21/2018  Medication Sig  . acetaminophen (TYLENOL) 325 MG tablet Take 650 mg by mouth every 6 (six) hours as needed.  Marland Kitchen amLODipine (NORVASC) 5 MG tablet Take 1 tablet (5 mg total) by mouth daily.  . ATROVENT HFA 17 MCG/ACT inhaler INL 2 PFS PO QID  . BREO ELLIPTA 200-25 MCG/INH AEPB INL 1 PUFF PO QD  . BYSTOLIC 20 MG TABS TAKE 1 TABLET BY MOUTH EVERY DAY  . CARAFATE 1 GM/10ML suspension take 2 teaspoonful by mouth twice a day  . diclofenac sodium (VOLTAREN) 1 % GEL apply 4 grams to affected area four times a day  . dorzolamide-timolol (COSOPT) 22.3-6.8 MG/ML ophthalmic solution Place 1 drop into both eyes 3 (three) times daily.    Marland Kitchen ezetimibe (ZETIA) 10 MG tablet TAKE 1 TABLET BY MOUTH ONCE DAILY  . fexofenadine (ALLEGRA) 180 MG tablet Take 180 mg by mouth daily.    Marland Kitchen FLOVENT HFA 110 MCG/ACT inhaler Inhale 2  puffs into the lungs 2 (two) times daily.  . furosemide (LASIX) 40 MG tablet Take 40 mg by mouth daily.  Marland Kitchen ipratropium (ATROVENT) 0.06 % nasal spray Place 2 sprays into both nostrils as needed for rhinitis.  Marland Kitchen latanoprost (XALATAN) 0.005 % ophthalmic solution INT 1 GTT IN OU D  . losartan (COZAAR) 100 MG tablet TAKE 1 TABLET BY MOUTH ONCE DAILY  . Multiple Vitamins-Minerals (MULTIVITAMIN WITH MINERALS) tablet Take 1 tablet by mouth daily.  Marland Kitchen omeprazole (PRILOSEC) 40 MG capsule TAKE 1 CAPSULE BY MOUTH ONCE DAILY  . pilocarpine (PILOCAR) 4 % ophthalmic solution Place 1 drop into the right eye at bedtime. Reported on 09/05/2015  . prednisoLONE acetate (PRED FORTE) 1 % ophthalmic suspension SHAKE LQ AND INT 1 GTT IN OU  QID   No facility-administered encounter medications on file as of 02/21/2018.      Allergies:  Allergies  Allergen Reactions  . Budesonide-Formoterol Fumarate     REACTION: causes her to lose her voice  . Codeine     REACTION: NAUSEA  . Coreg [Carvedilol]     Blurred vision, weak  . Guaifenesin     REACTION: blacks out, feels wierd  . Singulair [Montelukast Sodium]     Feeling bad     Family History: Family History  Problem Relation Age of Onset  . Heart disease Father        ??  . Other Mother        bladder tumor  . Arthritis Mother   . Cancer Sister   . Cancer Brother   . Colon cancer Neg Hx   . Esophageal cancer Neg Hx   . Pancreatic cancer Neg Hx   . Prostate cancer Neg Hx   . Rectal cancer Neg Hx   . Stomach cancer Neg Hx     Social History: Social History   Tobacco Use  . Smoking status: Never Smoker  . Smokeless tobacco: Never Used  Substance Use Topics  . Alcohol use: No  . Drug use: No   Social History   Social History Narrative   HSG. Married '56 - '87, widowed. 2 sons > '53, '62. 2 daughters > '59, '61. 9 granddaughters. 12 great-grandchildren. Lives in own home and son lives with her.  Retired.   ACP - asked but not answered (June '14). Referred to [email or web address]    Review of Systems:  CONSTITUTIONAL: No fevers, chills, night sweats, or weight loss.   EYES: No visual changes or eye pain ENT: No hearing changes.  No history of nose bleeds.   RESPIRATORY: No cough, wheezing and shortness of breath.   CARDIOVASCULAR: Negative for chest pain, and palpitations.   GI: Negative for abdominal discomfort, blood in stools or black stools.  No recent change in bowel habits.   GU:  No history of incontinence.   MUSCLOSKELETAL: +history of joint pain or swelling.  No myalgias.   SKIN: Negative for lesions, rash, and itching.   HEMATOLOGY/ONCOLOGY: Negative for prolonged bleeding, bruising easily, and swollen nodes.  No history of cancer.    ENDOCRINE: Negative for cold or heat intolerance, polydipsia or goiter.   PSYCH:  No depression or anxiety symptoms.   NEURO: As Above.   Vital Signs:  BP (!) 160/80   Pulse (!) 56   Ht 5' (1.524 m)   Wt 164 lb 4 oz (74.5 kg)   SpO2 98%   BMI 32.08 kg/m    General Medical Exam:   General:  Well appearing,  comfortable.   Eyes/ENT: see cranial nerve examination.   Neck: No masses appreciated.  Full range of motion without tenderness.  No carotid bruits. Respiratory:  Clear to auscultation, good air entry bilaterally.   Cardiac:  Regular rate and rhythm, no murmur.   Extremities:  No deformities, edema, or skin discoloration.  Skin:  No rashes or lesions.  Neurological Exam: MENTAL STATUS including orientation to time, place, person, recent and remote memory, attention span and concentration, language, and fund of knowledge is normal.  Speech is not dysarthric.  CRANIAL NERVES: II:  No visual field defects in the left eye.  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  Unremarkable fundi.   III-IV-VI: Pupils equal round and reactive to light.  Normal conjugate, extra-ocular eye movements in all directions of gaze.  No nystagmus.  Right ptosis.   V:  Normal facial sensation.    VII:  Normal facial symmetry and movements.  VIII:  Normal hearing and vestibular function.   IX-X:  Normal palatal movement.   XI:  Normal shoulder shrug and head rotation.   XII:  Normal tongue strength and range of motion, no deviation or fasciculation.  MOTOR:  Mild right intrinsic hand muscle atrophy, moreso over the ABP.  No fasciculations or abnormal movements.  No pronator drift.  Tone is normal.    Right Upper Extremity:    Left Upper Extremity:    Deltoid  5/5   Deltoid  5/5   Biceps  5/5   Biceps  5/5   Triceps  5/5   Triceps  5/5   Wrist extensors  5/5   Wrist extensors  5/5   Wrist flexors  5/5   Wrist flexors  5/5   Finger extensors  5/5   Finger extensors  5/5   Finger flexors  5/5   Finger  flexors  5/5   Dorsal interossei  5/5   Dorsal interossei  5/5   Abductor pollicis  4/5   Abductor pollicis  5-/5   Tone (Ashworth scale)  0  Tone (Ashworth scale)  0   Right Lower Extremity:    Left Lower Extremity:    Hip flexors  5/5   Hip flexors  5/5   Hip extensors  5/5   Hip extensors  5/5   Knee flexors  5/5   Knee flexors  5/5   Knee extensors  5/5   Knee extensors  5/5   Dorsiflexors  5/5   Dorsiflexors  5/5   Plantarflexors  5/5   Plantarflexors  5/5   Toe extensors  5/5   Toe extensors  5/5   Toe flexors  5/5   Toe flexors  5/5   Tone (Ashworth scale)  0  Tone (Ashworth scale)  0   MSRs:  Right                                                                 Left brachioradialis 2+  brachioradialis 2+  biceps 2+  biceps 2+  triceps 2+  triceps 2+  patellar 2+  patellar 2+  ankle jerk 0  ankle jerk 0  plantar response down  plantar response down   SENSORY:  Normal and symmetric perception of light touch, pinprick, vibration.  Tinel's sign is mildly positive  at the right wrist only.  COORDINATION/GAIT: Normal finger-to- nose-finger.  Intact rapid alternating movements bilaterally. Gait appears stooped, slow, unassisted and stable.    IMPRESSION: Right hand weakness, possibly secondary to carpal tunnel syndrome. 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  Exam shows mild ABP weakness and atrophy as well as Tinel's sign on the right wrist.  She will return for comprehensive nerve testing of the right hand to localize symptoms.  Further recommendations pending results.  Thank you for allowing me to participate in patient's care.  If I can answer any additional questions, I would be pleased to do so.    Sincerely,    Snigdha Howser K. Allena Katz, DO

## 2018-02-21 NOTE — Patient Instructions (Addendum)
I will see you back for a NCS/EMG of the right arm.  Please do not apply lotion on the day of testing.   ELECTROMYOGRAM AND NERVE CONDUCTION STUDIES (EMG/NCS) INSTRUCTIONS  How to Prepare The neurologist conducting the EMG will need to know if you have certain medical conditions. Tell the neurologist and other EMG lab personnel if you: . Have a pacemaker or any other electrical medical device . Take blood-thinning medications . Have hemophilia, a blood-clotting disorder that causes prolonged bleeding Bathing Take a shower or bath shortly before your exam in order to remove oils from your skin. Don't apply lotions or creams before the exam.  What to Expect You'll likely be asked to change into a hospital gown for the procedure and lie down on an examination table. The following explanations can help you understand what will happen during the exam.  . Electrodes. The neurologist or a technician places surface electrodes at various locations on your skin depending on where you're experiencing symptoms. Or the neurologist may insert needle electrodes at different sites depending on your symptoms.  . Sensations. The electrodes will at times transmit a tiny electrical current that you may feel as a twinge or spasm. The needle electrode may cause discomfort or pain that usually ends shortly after the needle is removed. If you are concerned about discomfort or pain, you may want to talk to the neurologist about taking a short break during the exam.  . Instructions. During the needle EMG, the neurologist will assess whether there is any spontaneous electrical activity when the muscle is at rest - activity that isn't present in healthy muscle tissue - and the degree of activity when you slightly contract the muscle.  He or she will give you instructions on resting and contracting a muscle at appropriate times. Depending on what muscles and nerves the neurologist is examining, he or she may ask you to change  positions during the exam.  After your EMG You may experience some temporary, minor bruising where the needle electrode was inserted into your muscle. This bruising should fade within several days. If it persists, contact your primary care doctor.

## 2018-03-15 DIAGNOSIS — N8189 Other female genital prolapse: Secondary | ICD-10-CM | POA: Diagnosis not present

## 2018-03-17 ENCOUNTER — Encounter: Payer: Medicare Other | Admitting: Neurology

## 2018-03-21 ENCOUNTER — Other Ambulatory Visit: Payer: Self-pay | Admitting: Internal Medicine

## 2018-03-25 NOTE — Progress Notes (Addendum)
Subjective:   [name] is a 85 y.o. female who presents for Medicare Annual (Subsequent) preventive examination.  Review of Systems:  No ROS.  Medicare Wellness Visit. Additional risk factors are reflected in the social history.  Cardiac Risk Factors include: advanced age (>71men, >38 women);hypertension;dyslipidemia Sleep patterns: has interrupted sleep, does not get up to void, gets up 1-2 times nightly to void and sleeps 7-8 hours nightly.    Home Safety/Smoke Alarms: Feels safe in home. Smoke alarms in place.  Living environment; residence and Firearm Safety: 1-story house/ trailer, no firearms. Lives with son, no needs for DME, good support system Seat Belt Safety/Bike Helmet: Wears seat belt.      Objective:     Vitals: BP (!) 154/78   Pulse 61   Ht 5' (1.524 m)   Wt 163 lb (73.9 kg)   SpO2 95%   BMI 31.83 kg/m   Body mass index is 31.83 kg/m.  Advanced Directives 03/28/2018 09/02/2016  Does Patient Have a Medical Advance Directive? No Yes  Type of Advance Directive - [place];Living will  Does patient want to make changes to medical advance directive? Yes (ED - Information included in AVS) -  Copy of Healthcare Power of Attorney in Chart? - No - copy requested    Tobacco Social History   Tobacco Use  Smoking Status Never Smoker  Smokeless Tobacco Never Used     Counseling given: Not Answered  Past Medical History:  Diagnosis Date  . Allergy   . Allergy, unspecified not elsewhere classified   . Anxiety   . Asthma   . Cataract    bil cataracts removed  . Diverticulitis   . Diverticulosis of colon (without mention of hemorrhage)   . DJD (degenerative joint disease)   . Gastritis   . GERD (gastroesophageal reflux disease)   . Glaucoma   . History of GI diverticular bleed   . HTN (hypertension)   . Hyperlipidemia   . LPRD (laryngopharyngeal reflux disease)    Past Surgical History:  Procedure Laterality Date  . BLADDER  REPAIR     after perforation  . cataract surgery  2011   w/ IOL [facility])  . COLONOSCOPY    . KIDNEY SURGERY    . KNEE ARTHROSCOPY  1998   left  . TOTAL ABDOMINAL HYSTERECTOMY     fibroid tumor  . UPPER GASTROINTESTINAL ENDOSCOPY    . WISDOM TOOTH EXTRACTION     Family History  Problem Relation Age of Onset  . Heart disease Father        ??  . Other Mother        bladder tumor  . Arthritis Mother   . Cancer Sister   . Cancer Brother   . Colon cancer Neg Hx   . Esophageal cancer Neg Hx   . Pancreatic cancer Neg Hx   . Prostate cancer Neg Hx   . Rectal cancer Neg Hx   . Stomach cancer Neg Hx    Social History   Socioeconomic History  . Marital status: Widowed    Spouse name: Not on file  . Number of children: 4  . Years of education: 35  . Highest education level: High school graduate  Occupational History  . 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 Occupation: Airline pilot: [employer]  Social Needs  . Financial resource strain: Not hard at all  . Food insecurity:    Worry: Never true  Inability: Never true  . Transportation needs:    Medical: No    Non-medical: No  Tobacco Use  . Smoking status: Never Smoker  . Smokeless tobacco: Never Used  Substance and Sexual Activity  . Alcohol use: No  . Drug use: No  . Sexual activity: Never  Lifestyle  . Physical activity:    Days per week: 3 days    Minutes per session: 40 min  . Stress: Not at all  Relationships  . Social connections:    Talks on phone: More than three times a week    Gets together: More than three times a week    Attends religious service: More than 4 times per year    Active member of club or organization: Yes    Attends meetings of clubs or organizations: More than 4 times per year    Relationship status: Widowed  Other Topics Concern  . Not on file  Social History Narrative   HSG. Married '56 - '87, widowed. 2 sons > '53, '62. 2 daughters > '59, '61. 9  granddaughters. 12 great-grandchildren. Lives in own home and son lives with her.  Retired.   ACP - asked but not answered (June '14). Referred to [email or web address].    Outpatient Encounter Medications as of 03/28/2018  Medication Sig  . acetaminophen (TYLENOL) 325 MG tablet Take 650 mg by mouth every 6 (six) hours as needed.  Marland Kitchen amLODipine (NORVASC) 5 MG tablet Take 1 tablet (5 mg total) by mouth daily.  . ATROVENT HFA 17 MCG/ACT inhaler INL 2 PFS PO QID  . BREO ELLIPTA 200-25 MCG/INH AEPB INL 1 PUFF PO QD  . BYSTOLIC 20 MG TABS TAKE 1 TABLET BY MOUTH EVERY DAY  . CARAFATE 1 GM/10ML suspension take 2 teaspoonful by mouth twice a day  . dorzolamide-timolol (COSOPT) 22.3-6.8 MG/ML ophthalmic solution Place 1 drop into both eyes 3 (three) times daily.    Marland Kitchen ezetimibe (ZETIA) 10 MG tablet TAKE 1 TABLET BY MOUTH ONCE DAILY  . fexofenadine (ALLEGRA) 180 MG tablet Take 180 mg by mouth daily.    Marland Kitchen FLOVENT HFA 110 MCG/ACT inhaler Inhale 2 puffs into the lungs 2 (two) times daily.  . furosemide (LASIX) 40 MG tablet Take 40 mg by mouth daily.  Marland Kitchen ipratropium (ATROVENT) 0.06 % nasal spray Place 2 sprays into both nostrils as needed for rhinitis.  Marland Kitchen latanoprost (XALATAN) 0.005 % ophthalmic solution INT 1 GTT IN OU D  . losartan (COZAAR) 100 MG tablet TAKE 1 TABLET BY MOUTH ONCE DAILY  . Multiple Vitamins-Minerals (MULTIVITAMIN WITH MINERALS) tablet Take 1 tablet by mouth daily.  Marland Kitchen omeprazole (PRILOSEC) 40 MG capsule TAKE 1 CAPSULE BY MOUTH ONCE DAILY  . pilocarpine (PILOCAR) 4 % ophthalmic solution Place 1 drop into the right eye at bedtime. Reported on 09/05/2015  . prednisoLONE acetate (PRED FORTE) 1 % ophthalmic suspension SHAKE LQ AND INT 1 GTT IN OU QID  . [DISCONTINUED] diclofenac sodium (VOLTAREN) 1 % GEL apply 4 grams to affected area four times a day   No facility-administered encounter medications on file as of 03/28/2018.     Activities of Daily Living In your present state of health,  do you have any difficulty performing the following activities: 03/28/2018  Hearing? N  Vision? N  Difficulty concentrating or making decisions? N  Walking or climbing stairs? N  Dressing or bathing? N  Doing errands, shopping? N  Preparing Food and eating ? N  Using the Toilet? N  In the  past six months, have you accidently leaked urine? N  Do you have problems with loss of bowel control? N  Managing your Medications? N  Managing your Finances? N  Housekeeping or managing your Housekeeping? N  Some recent data might be hidden    Patient Care Team: Plotnikov, Evie Lacks, MD as PCP - General (Internal Medicine) Gatha Mayer, MD as Consulting Physician (Gastroenterology)    Assessment:   This is a routine wellness examination for Dawnelle. Physical assessment deferred to PCP.  Exercise Activities and Dietary recommendations Current Exercise Habits: Home exercise routine, Type of exercise: walking, Time (Minutes): 30, Frequency (Times/Week): 3, Weekly Exercise (Minutes/Week): 90, Intensity: Mild, Exercise limited by: orthopedic condition(s)  Diet (meal preparation, eat out, water intake, caffeinated beverages, dairy products, fruits and vegetables): in general, a "healthy" diet  , on average, 2 meals per day. Reports decreased appetite at times.   Reviewed heart healthy and diabetic diet Discussed supplementing with Ensure, samples and coupons provided. Encouraged patient to increase daily water and healthy fluid intake.  Goals    . I want to travel     I am going [place] to visits my sisters, visit my nieces, go to [place]. 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    . Patient Stated     Continue to visit my sisters and family. Stay physically and socially active. Love God and love family.       Fall Risk Fall Risk  03/28/2018 02/21/2018 12/21/2017 10/02/2016 09/02/2016  Falls in the past year? 0 0 No No No  Comment - - - Emmi Telephone  Survey: data to providers prior to load -  Number falls in past yr: - 0 - - -  Injury with Fall? - 0 - - -  Risk for fall due to : Impaired balance/gait - - - -  Follow up Falls prevention discussed Falls evaluation completed - - -    Depression Screen PHQ 2/9 Scores 03/28/2018 12/21/2017 09/02/2016 06/04/2015  PHQ - 2 Score 0 0 0 0     Cognitive Function MMSE - Mini Mental State Exam 09/02/2016  Orientation to time 5  Orientation to Place 5  Registration 3  Attention/ Calculation 5  Recall 2  Language- name 2 objects 2  Language- repeat 1  Language- follow 3 step command 3  Language- read & follow direction 1  Write a sentence 1  Copy design 1  Total score 29       Ad8 score reviewed for issues:  Issues making decisions: no  Less interest in hobbies / activities: no  Repeats questions, stories (family complaining): no  Trouble using ordinary gadgets (microwave, computer, phone):no  Forgets the month or year: no  Mismanaging finances: no  Remembering appts: no  Daily problems with thinking and/or memory: no Ad8 score is= 0  Immunization History  Administered Date(s) Administered  . Influenza Split 11/26/2010, 01/25/2012  . Influenza Whole 12/12/2007, 01/28/2009, 11/14/2009  . Influenza, High Dose Seasonal PF 12/19/2012, 01/09/2016, 01/06/2017, 12/21/2017  . Influenza,inj,Quad PF,6+ Mos 12/06/2014  . Influenza-Unspecified 12/23/2013  . Pneumococcal Conjugate-13 05/30/2014  . Pneumococcal Polysaccharide-23 06/17/2002, 09/05/2015  . Td 08/20/2011  . Zoster 10/13/2011, 09/29/2012   Screening Tests Health Maintenance  Topic Date Due  . TETANUS/TDAP  08/19/2021  . DEXA SCAN  12/08/2021  . INFLUENZA VACCINE  Completed  . PNA vac Low Risk Adult  Completed      Plan:  Health maintenance screenings were reviewed with patient today and relevant education provided.   Continue doing brain stimulating activities (puzzles, reading, adult coloring books,  staying active) to keep memory sharp.   Continue to eat heart healthy diet (full of fruits, vegetables, whole grains, lean protein, water--limit salt, fat, and sugar intake) and increase physical activity as tolerated.  I have personally reviewed and noted the following in the patient's chart:   . Medical and social history . Use of alcohol, tobacco or illicit drugs  . Current medications and supplements . Functional ability and status . Nutritional status . Physical activity . Advanced directives . List of other physicians . Vitals . Screenings to include cognitive, depression, and falls . Referrals and appointments  In addition, I have reviewed and discussed with patient certain preventive protocols, quality metrics, and best practice recommendations. A written personalized care plan for preventive services as well as general preventive health recommendations were provided to patient.     Michiel Cowboy, RN  03/28/2018  Medical screening examination/treatment/procedure(s) were performed by non-physician practitioner and as supervising physician I was immediately available for consultation/collaboration. I agree with above. Lew Dawes, MD

## 2018-03-28 ENCOUNTER — Ambulatory Visit (INDEPENDENT_AMBULATORY_CARE_PROVIDER_SITE_OTHER): Payer: Medicare Other | Admitting: *Deleted

## 2018-03-28 ENCOUNTER — Encounter: Payer: Self-pay | Admitting: Internal Medicine

## 2018-03-28 ENCOUNTER — Ambulatory Visit (INDEPENDENT_AMBULATORY_CARE_PROVIDER_SITE_OTHER): Payer: Medicare Other | Admitting: Internal Medicine

## 2018-03-28 VITALS — BP 154/78 | HR 61 | Ht 60.0 in | Wt 163.0 lb

## 2018-03-28 DIAGNOSIS — E785 Hyperlipidemia, unspecified: Secondary | ICD-10-CM | POA: Diagnosis not present

## 2018-03-28 DIAGNOSIS — Z0000 Encounter for general adult medical examination without abnormal findings: Secondary | ICD-10-CM | POA: Diagnosis not present

## 2018-03-28 DIAGNOSIS — M1990 Unspecified osteoarthritis, unspecified site: Secondary | ICD-10-CM | POA: Diagnosis not present

## 2018-03-28 DIAGNOSIS — I10 Essential (primary) hypertension: Secondary | ICD-10-CM

## 2018-03-28 MED ORDER — DICLOFENAC SODIUM 1 % TD GEL: TRANSDERMAL | 4 refills | Status: DC

## 2018-03-28 NOTE — Patient Instructions (Addendum)
Continue doing brain stimulating activities (puzzles, reading, adult coloring books, staying active) to keep memory sharp.   Continue to eat heart healthy diet (full of fruits, vegetables, whole grains, lean protein, water--limit salt, fat, and sugar intake) and increase physical activity as tolerated.  Stress balls to exercise your hands.    [name] , Thank you for taking time to come for your Medicare Wellness Visit. I appreciate your ongoing commitment to your health goals. Please review the following plan we discussed and let me know if I can assist you in the future.   These are the goals we discussed: Goals    . I want to travel     I am going [place] to visits my sisters, visit my nieces, go to [place]. 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    . Patient Stated     Continue to visit my sisters and family. Stay physically and socially active. Love God and love family.       This is a list of the screening recommended for you and due dates:  Health Maintenance  Topic Date Due  . Tetanus Vaccine  08/19/2021  . DEXA scan (bone density measurement)  12/08/2021  . Flu Shot  Completed  . Pneumonia vaccines  Completed    Health Maintenance, Female Adopting a healthy lifestyle and getting preventive care can go a long way to promote health and wellness. Talk with your health care provider about what schedule of regular examinations is right for you. This is a good chance for you to check in with your provider about disease prevention and staying healthy. In between checkups, there are plenty of things you can do on your own. Experts have done a lot of research about which lifestyle changes and preventive measures are most likely to keep you healthy. Ask your health care provider for more information. Weight and diet Eat a healthy diet  Be sure to include plenty of vegetables, fruits, low-fat dairy products, and lean protein.  Do not eat  a lot of foods high in solid fats, added sugars, or salt.  Get regular exercise. This is one of the most important things you can do for your health. ? Most adults should exercise for at least 150 minutes each week. The exercise should increase your heart rate and make you sweat (moderate-intensity exercise). ? Most adults should also do strengthening exercises at least twice a week. This is in addition to the moderate-intensity exercise. Maintain a healthy weight  Body mass index (BMI) is a measurement that can be used to identify possible weight problems. It estimates body fat based on height and weight. Your health care provider can help determine your BMI and help you achieve or maintain a healthy weight.  For females 11 years of age and older: ? A BMI below 18.5 is considered underweight. ? A BMI of 18.5 to 24.9 is normal. ? A BMI of 25 to 29.9 is considered overweight. ? A BMI of 30 and above is considered obese. Watch levels of cholesterol and blood lipids  You should start having your blood tested for lipids and cholesterol at 83 years of age, then have this test every 5 years.  You may need to have your cholesterol levels checked more often if: ? Your lipid or cholesterol levels are high. ? You are older than 83 years of age. ? You are at high risk for heart disease. Cancer screening Lung  Cancer  Lung cancer screening is recommended for adults 51-24 years old who are at high risk for lung cancer because of a history of smoking.  A yearly low-dose CT scan of the lungs is recommended for people who: ? Currently smoke. ? Have quit within the past 15 years. ? Have at least a 30-pack-year history of smoking. A pack year is smoking an average of one pack of cigarettes a day for 1 year.  Yearly screening should continue until it has been 15 years since you quit.  Yearly screening should stop if you develop a health problem that would prevent you from having lung cancer  treatment. Breast Cancer  Practice breast self-awareness. This means understanding how your breasts normally appear and feel.  It also means doing regular breast self-exams. Let your health care provider know about any changes, no matter how small.  If you are in your 20s or 30s, you should have a clinical breast exam (CBE) by a health care provider every 1-3 years as part of a regular health exam.  If you are 6 or older, have a CBE every year. Also consider having a breast X-ray (mammogram) every year.  If you have a family history of breast cancer, talk to your health care provider about genetic screening.  If you are at high risk for breast cancer, talk to your health care provider about having an MRI and a mammogram every year.  Breast cancer gene (BRCA) assessment is recommended for women who have family members with BRCA-related cancers. BRCA-related cancers include: ? Breast. ? Ovarian. ? Tubal. ? Peritoneal cancers.  Results of the assessment will determine the need for genetic counseling and BRCA1 and BRCA2 testing. Cervical Cancer Your health care provider may recommend that you be screened regularly for cancer of the pelvic organs (ovaries, uterus, and vagina). This screening involves a pelvic examination, including checking for microscopic changes to the surface of your cervix (Pap test). You may be encouraged to have this screening done every 3 years, beginning at age 62.  For women ages 13-65, health care providers may recommend pelvic exams and Pap testing every 3 years, or they may recommend the Pap and pelvic exam, combined with testing for human papilloma virus (HPV), every 5 years. Some types of HPV increase your risk of cervical cancer. Testing for HPV may also be done on women of any age with unclear Pap test results.  Other health care providers may not recommend any screening for nonpregnant women who are considered low risk for pelvic cancer and who do not have  symptoms. Ask your health care provider if a screening pelvic exam is right for you.  If you have had past treatment for cervical cancer or a condition that could lead to cancer, you need Pap tests and screening for cancer for at least 20 years after your treatment. If Pap tests have been discontinued, your risk factors (such as having a new sexual partner) need to be reassessed to determine if screening should resume. Some women have medical problems that increase the chance of getting cervical cancer. In these cases, your health care provider may recommend more frequent screening and Pap tests. Colorectal Cancer  This type of cancer can be detected and often prevented.  Routine colorectal cancer screening usually begins at 83 years of age and continues through 83 years of age.  Your health care provider may recommend screening at an earlier age if you have risk factors for colon cancer.  Your health  care provider may also recommend using home test kits to check for hidden blood in the stool.  A small camera at the end of a tube can be used to examine your colon directly (sigmoidoscopy or colonoscopy). This is done to check for the earliest forms of colorectal cancer.  Routine screening usually begins at age 47.  Direct examination of the colon should be repeated every 5-10 years through 83 years of age. However, you may need to be screened more often if early forms of precancerous polyps or small growths are found. Skin Cancer  Check your skin from head to toe regularly.  Tell your health care provider about any new moles or changes in moles, especially if there is a change in a mole's shape or color.  Also tell your health care provider if you have a mole that is larger than the size of a pencil eraser.  Always use sunscreen. Apply sunscreen liberally and repeatedly throughout the day.  Protect yourself by wearing long sleeves, pants, a wide-brimmed hat, and sunglasses whenever you are  outside. Heart disease, diabetes, and high blood pressure  High blood pressure causes heart disease and increases the risk of stroke. High blood pressure is more likely to develop in: ? People who have blood pressure in the high end of the normal range (130-139/85-89 mm Hg). ? People who are overweight or obese. ? People who are African American.  If you are 55-30 years of age, have your blood pressure checked every 3-5 years. If you are 12 years of age or older, have your blood pressure checked every year. You should have your blood pressure measured twice-once when you are at a hospital or clinic, and once when you are not at a hospital or clinic. Record the average of the two measurements. To check your blood pressure when you are not at a hospital or clinic, you can use: ? An automated blood pressure machine at a pharmacy. ? A home blood pressure monitor.  If you are between 54 years and 36 years old, ask your health care provider if you should take aspirin to prevent strokes.  Have regular diabetes screenings. This involves taking a blood sample to check your fasting blood sugar level. ? If you are at a normal weight and have a low risk for diabetes, have this test once every three years after 83 years of age. ? If you are overweight and have a high risk for diabetes, consider being tested at a younger age or more often. Preventing infection Hepatitis B  If you have a higher risk for hepatitis B, you should be screened for this virus. You are considered at high risk for hepatitis B if: ? You were born in a country where hepatitis B is common. Ask your health care provider which countries are considered high risk. ? Your parents were born in a high-risk country, and you have not been immunized against hepatitis B (hepatitis B vaccine). ? You have HIV or AIDS. ? You use needles to inject street drugs. ? You live with someone who has hepatitis B. ? You have had sex with someone who has  hepatitis B. ? You get hemodialysis treatment. ? You take certain medicines for conditions, including cancer, organ transplantation, and autoimmune conditions. Hepatitis C  Blood testing is recommended for: ? Everyone born from [birth year] through 1963. ? Anyone with known risk factors for hepatitis C. Sexually transmitted infections (STIs)  You should be screened for sexually transmitted infections (STIs) including  gonorrhea and chlamydia if: ? You are sexually active and are younger than 83 years of age. ? You are older than 83 years of age and your health care provider tells you that you are at risk for this type of infection. ? Your sexual activity has changed since you were last screened and you are at an increased risk for chlamydia or gonorrhea. Ask your health care provider if you are at risk.  If you do not have HIV, but are at risk, it may be recommended that you take a prescription medicine daily to prevent HIV infection. This is called pre-exposure prophylaxis (PrEP). You are considered at risk if: ? You are sexually active and do not regularly use condoms or know the HIV status of your partner(s). ? You take drugs by injection. ? You are sexually active with a partner who has HIV. Talk with your health care provider about whether you are at high risk of being infected with HIV. If you choose to begin PrEP, you should first be tested for HIV. You should then be tested every 3 months for as long as you are taking PrEP. Pregnancy  If you are premenopausal and you may become pregnant, ask your health care provider about preconception counseling.  If you may become pregnant, take 400 to 800 micrograms (mcg) of folic acid every day.  If you want to prevent pregnancy, talk to your health care provider about birth control (contraception). Osteoporosis and menopause  Osteoporosis is a disease in which the bones lose minerals and strength with aging. This can result in serious bone  fractures. Your risk for osteoporosis can be identified using a bone density scan.  If you are 17 years of age or older, or if you are at risk for osteoporosis and fractures, ask your health care provider if you should be screened.  Ask your health care provider whether you should take a calcium or vitamin D supplement to lower your risk for osteoporosis.  Menopause may have certain physical symptoms and risks.  Hormone replacement therapy may reduce some of these symptoms and risks. Talk to your health care provider about whether hormone replacement therapy is right for you. Follow these instructions at home:  Schedule regular health, dental, and eye exams.  Stay current with your immunizations.  Do not use any tobacco products including cigarettes, chewing tobacco, or electronic cigarettes.  If you are pregnant, do not drink alcohol.  If you are breastfeeding, limit how much and how often you drink alcohol.  Limit alcohol intake to no more than 1 drink per day for nonpregnant women. One drink equals 12 ounces of beer, 5 ounces of Zymiere Trostle, or 1 ounces of hard liquor.  Do not use street drugs.  Do not share needles.  Ask your health care provider for help if you need support or information about quitting drugs.  Tell your health care provider if you often feel depressed.  Tell your health care provider if you have ever been abused or do not feel safe at home. This information is not intended to replace advice given to you by your health care provider. Make sure you discuss any questions you have with your health care provider. Document Released: 09/15/2010 Document Revised: 08/08/2015 Document Reviewed: 12/04/2014 Elsevier Interactive Patient Education  2019 [employer].

## 2018-03-28 NOTE — Progress Notes (Signed)
Subjective:  Patient ID: [name], female    DOB: [birth year]  Age: 85 y.o. MRN: [id number]  CC: No chief complaint on file.   HPI VERTIS VALLIANT presents for HTN, GERD, asthma f/u  Outpatient Medications Prior to Visit  Medication Sig Dispense Refill  . acetaminophen (TYLENOL) 325 MG tablet Take 650 mg by mouth every 6 (six) hours as needed.    Marland Kitchen amLODipine (NORVASC) 5 MG tablet Take 1 tablet (5 mg total) by mouth daily. 90 tablet 1  . ATROVENT HFA 17 MCG/ACT inhaler INL 2 PFS PO QID  5  . BREO ELLIPTA 200-25 MCG/INH AEPB INL 1 PUFF PO QD  5  . BYSTOLIC 20 MG TABS TAKE 1 TABLET BY MOUTH EVERY DAY 30 tablet 3  . CARAFATE 1 GM/10ML suspension take 2 teaspoonful by mouth twice a day 420 mL 11  . diclofenac sodium (VOLTAREN) 1 % GEL apply 4 grams to affected area four times a day 200 g 4  . dorzolamide-timolol (COSOPT) 22.3-6.8 MG/ML ophthalmic solution Place 1 drop into both eyes 3 (three) times daily.      Marland Kitchen ezetimibe (ZETIA) 10 MG tablet TAKE 1 TABLET BY MOUTH ONCE DAILY 30 tablet 11  . fexofenadine (ALLEGRA) 180 MG tablet Take 180 mg by mouth daily.      Marland Kitchen FLOVENT HFA 110 MCG/ACT inhaler Inhale 2 puffs into the lungs 2 (two) times daily.  0  . furosemide (LASIX) 40 MG tablet Take 40 mg by mouth daily.  5  . ipratropium (ATROVENT) 0.06 % nasal spray Place 2 sprays into both nostrils as needed for rhinitis.    Marland Kitchen latanoprost (XALATAN) 0.005 % ophthalmic solution INT 1 GTT IN OU D  3  . losartan (COZAAR) 100 MG tablet TAKE 1 TABLET BY MOUTH ONCE DAILY 30 tablet 11  . Multiple Vitamins-Minerals (MULTIVITAMIN WITH MINERALS) tablet Take 1 tablet by mouth daily.    Marland Kitchen omeprazole (PRILOSEC) 40 MG capsule TAKE 1 CAPSULE BY MOUTH ONCE DAILY 30 capsule 11  . pilocarpine (PILOCAR) 4 % ophthalmic solution Place 1 drop into the right eye at bedtime. Reported on 09/05/2015    . prednisoLONE acetate (PRED FORTE) 1 % ophthalmic suspension SHAKE LQ AND INT 1 GTT IN OU QID  0   No  facility-administered medications prior to visit.     ROS: Review of Systems  Constitutional: Negative for activity change, appetite change, chills, fatigue and unexpected weight change.  HENT: Negative for congestion, mouth sores and sinus pressure.   Eyes: Negative for visual disturbance.  Respiratory: Negative for cough and chest tightness.   Gastrointestinal: Negative for abdominal pain and nausea.  Genitourinary: Negative for difficulty urinating, frequency and vaginal pain.  Musculoskeletal: Positive for arthralgias. Negative for back pain and gait problem.  Skin: Negative for pallor and rash.  Neurological: Negative for dizziness, tremors, weakness, numbness and headaches.  Psychiatric/Behavioral: Negative for confusion and sleep disturbance.    Objective:  BP (!) 154/78 (BP Location: Left Arm, Patient Position: Sitting, Cuff Size: Normal)   Pulse 61   Temp 98.4 F (36.9 C) (Oral)   Ht 5' (1.524 m)   Wt 163 lb (73.9 kg)   SpO2 95%   BMI 31.83 kg/m   BP Readings from Last 3 Encounters:  03/28/18 (!) 154/78  02/21/18 (!) 160/80  12/21/17 132/80    Wt Readings from Last 3 Encounters:  03/28/18 163 lb (73.9 kg)  02/21/18 164 lb 4 oz (74.5 kg)  12/21/17 166 lb (  75.3 kg)    Physical Exam Constitutional:      General: She is not in acute distress.    Appearance: She is well-developed.  HENT:     Head: Normocephalic.     Right Ear: External ear normal.     Left Ear: External ear normal.     Nose: Nose normal.  Eyes:     General:        Right eye: No discharge.        Left eye: No discharge.     Conjunctiva/sclera: Conjunctivae normal.     Pupils: Pupils are equal, round, and reactive to light.  Neck:     Musculoskeletal: Normal range of motion and neck supple.     Thyroid: No thyromegaly.     Vascular: No JVD.     Trachea: No tracheal deviation.  Cardiovascular:     Rate and Rhythm: Normal rate and regular rhythm.     Heart sounds: Normal heart sounds.    Pulmonary:     Effort: No respiratory distress.     Breath sounds: No stridor. No wheezing.  Abdominal:     General: Bowel sounds are normal. There is no distension.     Palpations: Abdomen is soft. There is no mass.     Tenderness: There is no abdominal tenderness. There is no guarding or rebound.  Musculoskeletal:        General: No tenderness.  Lymphadenopathy:     Cervical: No cervical adenopathy.  Skin:    Findings: No erythema or rash.  Neurological:     Cranial Nerves: No cranial nerve deficit.     Motor: No abnormal muscle tone.     Coordination: Coordination normal.     Deep Tendon Reflexes: Reflexes normal.  Psychiatric:        Behavior: Behavior normal.        Thought Content: Thought content normal.        Judgment: Judgment normal.     Lab Results  Component Value Date   WBC 7.0 01/06/2017   HGB 12.3 01/06/2017   HCT 38.4 01/06/2017   PLT 261.0 01/06/2017   GLUCOSE 95 08/31/2017   CHOL 177 01/06/2017   TRIG 66.0 01/06/2017   HDL 59.50 01/06/2017   LDLCALC 104 (H) 01/06/2017   ALT 9 01/06/2017   AST 14 01/06/2017   NA 143 08/31/2017   K 4.0 08/31/2017   CL 106 08/31/2017   CREATININE 0.85 08/31/2017   BUN 10 08/31/2017   CO2 31 08/31/2017   TSH 1.94 01/06/2017    Vas US Carotid  Result Date: 10/27/2017 Carotid Arterial Duplex Study Indications:       Carotid artery stenosis. Risk Factors:      Hypertension, no history of smoking. Other Factors:     Dyslipidemia. Comparison Study:  Previous carotid duplex examination performed in 07/2016                    showed the left ICA with velocities of 70/15 cm/sec and the                    left ICA with velocities of 169/44 cm/sec. Performing Technologist: Levy Sjogren RVT  Examination Guidelines: A complete evaluation includes B-mode imaging, spectral Doppler, color Doppler, and power Doppler as needed of all accessible portions of each vessel. Bilateral testing is considered an integral part of a complete  examination. Limited examinations for reoccurring indications may be performed as noted.  Right  Carotid Findings: +----------+--------+--------+--------+------------+--------+           PSV cm/sEDV cm/sStenosisDescribe    Comments +----------+--------+--------+--------+------------+--------+ CCA Prox  75      15                                   +----------+--------+--------+--------+------------+--------+ CCA Distal67      11                                   +----------+--------+--------+--------+------------+--------+ ICA Prox  70      17      1-39%   heterogenous         +----------+--------+--------+--------+------------+--------+ ICA Mid   67      17                          tortuous +----------+--------+--------+--------+------------+--------+ ICA Distal117     31                          tortuous +----------+--------+--------+--------+------------+--------+ ECA       88      22                                   +----------+--------+--------+--------+------------+--------+ +----------+--------+-------+----------------+-------------------+           PSV cm/sEDV cmsDescribe        Arm Pressure (mmHG) +----------+--------+-------+----------------+-------------------+ [id number]             Multiphasic, [id number]                 +----------+--------+-------+----------------+-------------------+ +---------+--------+--+--------+--+---------+ VertebralPSV cm/s62EDV cm/s15Antegrade +---------+--------+--+--------+--+---------+ High bifurcation and tortuous ICA  Left Carotid Findings: +----------+--------+--------+--------+------------+--------+           PSV cm/sEDV cm/sStenosisDescribe    Comments +----------+--------+--------+--------+------------+--------+ CCA Prox  86      17                                   +----------+--------+--------+--------+------------+--------+ CCA Distal70      17                                    +----------+--------+--------+--------+------------+--------+ ICA Prox  55      13              heterogenous         +----------+--------+--------+--------+------------+--------+ ICA Mid   60      20      1-39%               tortuous +----------+--------+--------+--------+------------+--------+ ICA Distal72      22                          tortuous +----------+--------+--------+--------+------------+--------+ ECA       76      10                                   +----------+--------+--------+--------+------------+--------+ +----------+--------+--------+----------------+-------------------+ SubclavianPSV cm/sEDV cm/sDescribe        Arm Pressure (  mmHG) +----------+--------+--------+----------------+-------------------+           63              Multiphasic, [id number]                 +----------+--------+--------+----------------+-------------------+ +---------+--------+--+--------+--+---------+ VertebralPSV cm/s45EDV cm/s11Antegrade +---------+--------+--+--------+--+---------+ High bifurcation and tortuous ICA  Final Interpretation: Right Carotid: Velocities in the right ICA are consistent with a 1-39% stenosis. Left Carotid: Velocities in the left ICA are consistent with a 1-39% stenosis. Vertebrals:  Bilateral vertebral arteries demonstrate antegrade flow. Subclavians: Normal flow hemodynamics were seen in bilateral subclavian              arteries. *See table(s) above for measurements and observations.  Electronically signed by Tobias Alexander MD on 10/27/2017 at 2:54:00 PM.    Final     Assessment & Plan:   There are no diagnoses linked to this encounter.   No orders of the defined types were placed in this encounter.    Follow-up: No follow-ups on file.  Sonda Primes, MD

## 2018-03-28 NOTE — Assessment & Plan Note (Signed)
Losartan, Bystolic, Furosemide, Amlodipine 5 mg

## 2018-03-28 NOTE — Assessment & Plan Note (Signed)
Voltaren gel 

## 2018-03-28 NOTE — Assessment & Plan Note (Signed)
Zetia

## 2018-04-12 DIAGNOSIS — H401131 Primary open-angle glaucoma, bilateral, mild stage: Secondary | ICD-10-CM | POA: Diagnosis not present

## 2018-04-14 ENCOUNTER — Encounter: Payer: Self-pay | Admitting: Internal Medicine

## 2018-05-08 ENCOUNTER — Other Ambulatory Visit: Payer: Self-pay | Admitting: Internal Medicine

## 2018-06-06 ENCOUNTER — Other Ambulatory Visit: Payer: Self-pay | Admitting: Internal Medicine

## 2018-07-07 ENCOUNTER — Other Ambulatory Visit: Payer: Self-pay | Admitting: Internal Medicine

## 2018-07-19 ENCOUNTER — Ambulatory Visit: Payer: Medicare Other | Admitting: Internal Medicine

## 2018-08-06 ENCOUNTER — Other Ambulatory Visit: Payer: Self-pay | Admitting: Internal Medicine

## 2018-09-08 ENCOUNTER — Ambulatory Visit (INDEPENDENT_AMBULATORY_CARE_PROVIDER_SITE_OTHER): Payer: Medicare Other | Admitting: Internal Medicine

## 2018-09-08 ENCOUNTER — Other Ambulatory Visit: Payer: Self-pay

## 2018-09-08 ENCOUNTER — Encounter: Payer: Self-pay | Admitting: Internal Medicine

## 2018-09-08 ENCOUNTER — Other Ambulatory Visit (INDEPENDENT_AMBULATORY_CARE_PROVIDER_SITE_OTHER): Payer: Medicare Other

## 2018-09-08 DIAGNOSIS — K219 Gastro-esophageal reflux disease without esophagitis: Secondary | ICD-10-CM

## 2018-09-08 DIAGNOSIS — G500 Trigeminal neuralgia: Secondary | ICD-10-CM | POA: Diagnosis not present

## 2018-09-08 DIAGNOSIS — I10 Essential (primary) hypertension: Secondary | ICD-10-CM

## 2018-09-08 DIAGNOSIS — K222 Esophageal obstruction: Secondary | ICD-10-CM

## 2018-09-08 DIAGNOSIS — J45909 Unspecified asthma, uncomplicated: Secondary | ICD-10-CM | POA: Diagnosis not present

## 2018-09-08 DIAGNOSIS — E785 Hyperlipidemia, unspecified: Secondary | ICD-10-CM

## 2018-09-08 DIAGNOSIS — M1990 Unspecified osteoarthritis, unspecified site: Secondary | ICD-10-CM

## 2018-09-08 LAB — URINALYSIS, ROUTINE W REFLEX MICROSCOPIC
Bilirubin Urine: NEGATIVE
Ketones, ur: NEGATIVE
Nitrite: NEGATIVE
Specific Gravity, Urine: <=1.005 — AB (ref 1.000–1.030)
Total Protein, Urine: NEGATIVE
Urine Glucose: NEGATIVE
Urobilinogen, UA: 0.2 (ref 0.0–1.0)
pH: 6 (ref 5.0–8.0)

## 2018-09-08 LAB — CBC WITH DIFFERENTIAL/PLATELET
Basophils Absolute: 0.1 10*3/uL (ref 0.0–0.1)
Basophils Relative: 0.8 % (ref 0.0–3.0)
Eosinophils Absolute: 0.7 10*3/uL (ref 0.0–0.7)
Eosinophils Relative: 11.2 % — ABNORMAL HIGH (ref 0.0–5.0)
HCT: 37 % (ref 36.0–46.0)
Hemoglobin: 11.9 g/dL — ABNORMAL LOW (ref 12.0–15.0)
Lymphocytes Relative: 32.5 % (ref 12.0–46.0)
Lymphs Abs: 2.1 10*3/uL (ref 0.7–4.0)
MCHC: 32.3 g/dL (ref 30.0–36.0)
MCV: 83.5 fl (ref 78.0–100.0)
Monocytes Absolute: 0.4 10*3/uL (ref 0.1–1.0)
Monocytes Relative: 6.3 % (ref 3.0–12.0)
Neutro Abs: 3.2 10*3/uL (ref 1.4–7.7)
Neutrophils Relative %: 49.2 % (ref 43.0–77.0)
Platelets: 283 10*3/uL (ref 150.0–400.0)
RBC: 4.43 Mil/uL (ref 3.87–5.11)
RDW: 15.1 % (ref 11.5–15.5)
WBC: 6.6 10*3/uL (ref 4.0–10.5)

## 2018-09-08 LAB — BASIC METABOLIC PANEL
BUN: 10 mg/dL (ref 6–23)
CO2: 27 mEq/L (ref 19–32)
Calcium: 9.3 mg/dL (ref 8.4–10.5)
Chloride: 109 mEq/L (ref 96–112)
Creatinine, Ser: 0.78 mg/dL (ref 0.40–1.20)
GFR: 85.37 mL/min (ref >60.00)
Glucose, Bld: 88 mg/dL (ref 70–99)
Potassium: 4.2 mEq/L (ref 3.5–5.1)
Sodium: 144 mEq/L (ref 135–145)

## 2018-09-08 LAB — LIPID PANEL
Cholesterol: 172 mg/dL (ref 0–200)
HDL: 54 mg/dL (ref >39.00)
LDL Cholesterol: 103 mg/dL — ABNORMAL HIGH (ref 0–99)
NonHDL: 118
Total CHOL/HDL Ratio: 3
Triglycerides: 76 mg/dL (ref 0.0–149.0)
VLDL: 15.2 mg/dL (ref 0.0–40.0)

## 2018-09-08 LAB — TSH: TSH: 1.85 u[IU]/mL (ref 0.35–4.50)

## 2018-09-08 LAB — HEPATIC FUNCTION PANEL
ALT: 9 U/L (ref 0–35)
AST: 14 U/L (ref 0–37)
Albumin: 3.9 g/dL (ref 3.5–5.2)
Alkaline Phosphatase: 82 U/L (ref 39–117)
Bilirubin, Direct: 0.1 mg/dL (ref 0.0–0.3)
Total Bilirubin: 0.6 mg/dL (ref 0.2–1.2)
Total Protein: 6.9 g/dL (ref 6.0–8.3)

## 2018-09-08 NOTE — Assessment & Plan Note (Signed)
Voltaren gel 

## 2018-09-08 NOTE — Assessment & Plan Note (Signed)
Nl BP at home  SBP 130 or less Losartan, Bystolic, Furosemide, Amlodipine 5 mg

## 2018-09-08 NOTE — Progress Notes (Signed)
Subjective:  Patient ID: [name], female    DOB: [birth year]  Age: 85 y.o. MRN: [id number]  CC: No chief complaint on file.   HPI LARREN YESKE presents for HTN, dyslipidemia, asthma C/o L HA at times - ?neuralgia  Outpatient Medications Prior to Visit  Medication Sig Dispense Refill   acetaminophen (TYLENOL) 325 MG tablet Take 650 mg by mouth every 6 (six) hours as needed.     amLODipine (NORVASC) 5 MG tablet TAKE 1 TABLET(5 MG) BY MOUTH DAILY 90 tablet 3   ATROVENT HFA 17 MCG/ACT inhaler INL 2 PFS PO QID  5   BREO ELLIPTA 200-25 MCG/INH AEPB INL 1 PUFF PO QD  5   BYSTOLIC 20 MG TABS TAKE 1 TABLET BY MOUTH EVERY DAY 30 tablet 3   CARAFATE 1 GM/10ML suspension take 2 teaspoonful by mouth twice a day 420 mL 11   diclofenac sodium (VOLTAREN) 1 % GEL apply 4 grams to affected area four times a day 200 g 4   dorzolamide-timolol (COSOPT) 22.3-6.8 MG/ML ophthalmic solution Place 1 drop into both eyes 3 (three) times daily.       ezetimibe (ZETIA) 10 MG tablet TAKE 1 TABLET BY MOUTH ONCE DAILY 30 tablet 11   fexofenadine (ALLEGRA) 180 MG tablet Take 180 mg by mouth daily.       FLOVENT HFA 110 MCG/ACT inhaler Inhale 2 puffs into the lungs 2 (two) times daily.  0   furosemide (LASIX) 40 MG tablet TAKE 1 TABLET BY MOUTH ONCE DAILY 90 tablet 3   ipratropium (ATROVENT) 0.06 % nasal spray Place 2 sprays into both nostrils as needed for rhinitis.     latanoprost (XALATAN) 0.005 % ophthalmic solution INT 1 GTT IN OU D  3   losartan (COZAAR) 100 MG tablet TAKE 1 TABLET BY MOUTH ONCE DAILY 30 tablet 11   Multiple Vitamins-Minerals (MULTIVITAMIN WITH MINERALS) tablet Take 1 tablet by mouth daily.     omeprazole (PRILOSEC) 40 MG capsule TAKE 1 CAPSULE BY MOUTH ONCE DAILY 30 capsule 11   pilocarpine (PILOCAR) 4 % ophthalmic solution Place 1 drop into the right eye at bedtime. Reported on 09/05/2015     prednisoLONE acetate (PRED FORTE) 1 % ophthalmic suspension SHAKE LQ AND  INT 1 GTT IN OU QID  0   No facility-administered medications prior to visit.     ROS: Review of Systems  Constitutional: Positive for unexpected weight change. Negative for activity change, appetite change, chills and fatigue.  HENT: Negative for congestion, mouth sores and sinus pressure.   Eyes: Negative for visual disturbance.  Respiratory: Negative for cough and chest tightness.   Gastrointestinal: Negative for abdominal pain and nausea.  Genitourinary: Negative for difficulty urinating, frequency and vaginal pain.  Musculoskeletal: Positive for arthralgias. Negative for back pain and gait problem.  Skin: Negative for pallor and rash.  Neurological: Negative for dizziness, tremors, weakness, numbness and headaches.  Psychiatric/Behavioral: Negative for confusion, sleep disturbance and suicidal ideas.    Objective:  BP (!) 178/86 (BP Location: Left Arm, Patient Position: Sitting, Cuff Size: Large)    Pulse 63    Temp 98.1 F (36.7 C) (Oral)    Ht 5' (1.524 m)    Wt 167 lb (75.8 kg)    SpO2 97%    BMI 32.61 kg/m   BP Readings from Last 3 Encounters:  09/08/18 (!) 178/86  03/28/18 (!) 154/78  03/28/18 (!) 154/78    Wt Readings from Last 3 Encounters:  09/08/18 167 lb (75.8 kg)  03/28/18 163 lb (73.9 kg)  03/28/18 163 lb (73.9 kg)    Physical Exam Constitutional:      General: She is not in acute distress.    Appearance: She is well-developed.  HENT:     Head: Normocephalic.     Right Ear: External ear normal.     Left Ear: External ear normal.     Nose: Nose normal.  Eyes:     General:        Right eye: No discharge.        Left eye: No discharge.     Conjunctiva/sclera: Conjunctivae normal.     Pupils: Pupils are equal, round, and reactive to light.  Neck:     Musculoskeletal: Normal range of motion and neck supple.     Thyroid: No thyromegaly.     Vascular: No JVD.     Trachea: No tracheal deviation.  Cardiovascular:     Rate and Rhythm: Normal rate and  regular rhythm.     Heart sounds: Normal heart sounds.  Pulmonary:     Effort: No respiratory distress.     Breath sounds: No stridor. No wheezing.  Abdominal:     General: Bowel sounds are normal. There is no distension.     Palpations: Abdomen is soft. There is no mass.     Tenderness: There is no abdominal tenderness. There is no guarding or rebound.  Musculoskeletal:        General: No tenderness.  Lymphadenopathy:     Cervical: No cervical adenopathy.  Skin:    Findings: No erythema or rash.  Neurological:     Cranial Nerves: No cranial nerve deficit.     Motor: No abnormal muscle tone.     Coordination: Coordination normal.     Deep Tendon Reflexes: Reflexes normal.  Psychiatric:        Behavior: Behavior normal.        Thought Content: Thought content normal.        Judgment: Judgment normal.    Scalp, jaw, neck - NT, no mass  Lab Results  Component Value Date   WBC 7.0 01/06/2017   HGB 12.3 01/06/2017   HCT 38.4 01/06/2017   PLT 261.0 01/06/2017   GLUCOSE 95 08/31/2017   CHOL 177 01/06/2017   TRIG 66.0 01/06/2017   HDL 59.50 01/06/2017   LDLCALC 104 (H) 01/06/2017   ALT 9 01/06/2017   AST 14 01/06/2017   NA 143 08/31/2017   K 4.0 08/31/2017   CL 106 08/31/2017   CREATININE 0.85 08/31/2017   BUN 10 08/31/2017   CO2 31 08/31/2017   TSH 1.94 01/06/2017    Vas US Carotid  Result Date: 10/27/2017 Carotid Arterial Duplex Study Indications:       Carotid artery stenosis. Risk Factors:      Hypertension, no history of smoking. Other Factors:     Dyslipidemia. Comparison Study:  Previous carotid duplex examination performed in 07/2016                    showed the left ICA with velocities of 70/15 cm/sec and the                    left ICA with velocities of 169/44 cm/sec. Performing Technologist: Levy Sjogren RVT  Examination Guidelines: A complete evaluation includes B-mode imaging, spectral Doppler, color Doppler, and power Doppler as needed of all accessible  portions of each vessel. Bilateral testing  is considered an integral part of a complete examination. Limited examinations for reoccurring indications may be performed as noted.  Right Carotid Findings: +----------+--------+--------+--------+------------+--------+             PSV cm/s EDV cm/s Stenosis Describe     Comments  +----------+--------+--------+--------+------------+--------+  CCA Prox   75       15                                       +----------+--------+--------+--------+------------+--------+  CCA Distal 67       11                                       +----------+--------+--------+--------+------------+--------+  ICA Prox   70       17       1-39%    heterogenous           +----------+--------+--------+--------+------------+--------+  ICA Mid    67       17                             tortuous  +----------+--------+--------+--------+------------+--------+  ICA Distal 117      31                             tortuous  +----------+--------+--------+--------+------------+--------+  ECA        88       22                                       +----------+--------+--------+--------+------------+--------+ +----------+--------+-------+----------------+-------------------+             PSV cm/s EDV cms Describe         Arm Pressure (mmHG)  +----------+--------+-------+----------------+-------------------+  Subclavian 81               Multiphasic, WNL 150                  +----------+--------+-------+----------------+-------------------+ +---------+--------+--+--------+--+---------+  Vertebral PSV cm/s 62 EDV cm/s 15 Antegrade  +---------+--------+--+--------+--+---------+ High bifurcation and tortuous ICA  Left Carotid Findings: +----------+--------+--------+--------+------------+--------+             PSV cm/s EDV cm/s Stenosis Describe     Comments  +----------+--------+--------+--------+------------+--------+  CCA Prox   86       17                                        +----------+--------+--------+--------+------------+--------+  CCA Distal 70       17                                       +----------+--------+--------+--------+------------+--------+  ICA Prox   55       13                heterogenous           +----------+--------+--------+--------+------------+--------+  ICA Mid    60  20       1-39%                 tortuous  +----------+--------+--------+--------+------------+--------+  ICA Distal 72       22                             tortuous  +----------+--------+--------+--------+------------+--------+  ECA        76       10                                       +----------+--------+--------+--------+------------+--------+ +----------+--------+--------+----------------+-------------------+  Subclavian PSV cm/s EDV cm/s Describe         Arm Pressure (mmHG)  +----------+--------+--------+----------------+-------------------+             63                Multiphasic, WNL 150                  +----------+--------+--------+----------------+-------------------+ +---------+--------+--+--------+--+---------+  Vertebral PSV cm/s 45 EDV cm/s 11 Antegrade  +---------+--------+--+--------+--+---------+ High bifurcation and tortuous ICA  Final Interpretation: Right Carotid: Velocities in the right ICA are consistent with a 1-39% stenosis. Left Carotid: Velocities in the left ICA are consistent with a 1-39% stenosis. Vertebrals:  Bilateral vertebral arteries demonstrate antegrade flow. Subclavians: Normal flow hemodynamics were seen in bilateral subclavian              arteries. *See table(s) above for measurements and observations.  Electronically signed by Tobias Alexander MD on 10/27/2017 at 2:54:00 PM.    Final     Assessment & Plan:   There are no diagnoses linked to this encounter.   No orders of the defined types were placed in this encounter.    Follow-up: No follow-ups on file.  Sonda Primes, MD

## 2018-09-08 NOTE — Assessment & Plan Note (Signed)
6/20  - probable Tylenol prn

## 2018-09-08 NOTE — Assessment & Plan Note (Signed)
Dr Orvil Feil Chronic  Xopenex prn, Qvar

## 2018-09-08 NOTE — Assessment & Plan Note (Signed)
Zetia

## 2018-09-08 NOTE — Patient Instructions (Signed)
If you have medicare related insurance (such as traditional Medicare, Blue Cross Medicare, [employer] Medicare, or similar), Please make an appointment at the scheduling desk with Jill, the Wellness Health Coach, for your Wellness visit in this office, which is a benefit with your insurance.  

## 2018-09-08 NOTE — Assessment & Plan Note (Signed)
On Omeprazole, Carafate Dr Carlean Purl

## 2018-09-15 DIAGNOSIS — N8111 Cystocele, midline: Secondary | ICD-10-CM | POA: Diagnosis not present

## 2018-09-15 DIAGNOSIS — N8189 Other female genital prolapse: Secondary | ICD-10-CM | POA: Diagnosis not present

## 2018-09-19 DIAGNOSIS — H401131 Primary open-angle glaucoma, bilateral, mild stage: Secondary | ICD-10-CM | POA: Diagnosis not present

## 2018-10-31 DIAGNOSIS — J301 Allergic rhinitis due to pollen: Secondary | ICD-10-CM | POA: Diagnosis not present

## 2018-10-31 DIAGNOSIS — J3089 Other allergic rhinitis: Secondary | ICD-10-CM | POA: Diagnosis not present

## 2018-10-31 DIAGNOSIS — J4530 Mild persistent asthma, uncomplicated: Secondary | ICD-10-CM | POA: Diagnosis not present

## 2018-11-12 ENCOUNTER — Other Ambulatory Visit: Payer: Self-pay | Admitting: Internal Medicine

## 2018-11-19 ENCOUNTER — Ambulatory Visit (INDEPENDENT_AMBULATORY_CARE_PROVIDER_SITE_OTHER): Payer: Medicare Other

## 2018-11-19 ENCOUNTER — Other Ambulatory Visit: Payer: Self-pay

## 2018-11-19 DIAGNOSIS — Z23 Encounter for immunization: Secondary | ICD-10-CM

## 2019-01-14 ENCOUNTER — Other Ambulatory Visit: Payer: Self-pay | Admitting: Internal Medicine

## 2019-01-23 DIAGNOSIS — H18421 Band keratopathy, right eye: Secondary | ICD-10-CM | POA: Diagnosis not present

## 2019-01-23 DIAGNOSIS — H401131 Primary open-angle glaucoma, bilateral, mild stage: Secondary | ICD-10-CM | POA: Diagnosis not present

## 2019-01-23 DIAGNOSIS — H2511 Age-related nuclear cataract, right eye: Secondary | ICD-10-CM | POA: Diagnosis not present

## 2019-01-23 DIAGNOSIS — Z961 Presence of intraocular lens: Secondary | ICD-10-CM | POA: Diagnosis not present

## 2019-01-30 ENCOUNTER — Telehealth: Payer: Self-pay | Admitting: Internal Medicine

## 2019-01-30 NOTE — Telephone Encounter (Signed)
Per note, patient says insurance will not cover for Carafate /

## 2019-01-30 NOTE — Telephone Encounter (Signed)
Patient stated her insurance company will not pay for her CARAFATE 1 GM/10ML suspension  medication.  Please advise.

## 2019-02-01 NOTE — Telephone Encounter (Signed)
Are the tablets covered?  Thanks

## 2019-02-01 NOTE — Telephone Encounter (Signed)
Please advise on alternative.  

## 2019-02-02 NOTE — Telephone Encounter (Signed)
Pt is unsure, can we send in and see if they are?

## 2019-02-03 MED ORDER — SUCRALFATE 1 G PO TABS: 1.0000 g | ORAL_TABLET | Freq: Three times a day (TID) | ORAL | 11 refills | Status: DC

## 2019-02-03 NOTE — Telephone Encounter (Signed)
Pt.notified

## 2019-02-03 NOTE — Telephone Encounter (Signed)
Ok Thx 

## 2019-02-27 ENCOUNTER — Ambulatory Visit: Payer: Medicare Other | Admitting: Internal Medicine

## 2019-02-28 ENCOUNTER — Other Ambulatory Visit: Payer: Self-pay

## 2019-02-28 ENCOUNTER — Encounter: Payer: Self-pay | Admitting: Internal Medicine

## 2019-02-28 ENCOUNTER — Other Ambulatory Visit (INDEPENDENT_AMBULATORY_CARE_PROVIDER_SITE_OTHER): Payer: Medicare Other

## 2019-02-28 ENCOUNTER — Ambulatory Visit (INDEPENDENT_AMBULATORY_CARE_PROVIDER_SITE_OTHER): Payer: Medicare Other | Admitting: Internal Medicine

## 2019-02-28 VITALS — BP 156/84 | HR 61 | Temp 98.0°F | Ht 60.0 in | Wt 163.0 lb

## 2019-02-28 DIAGNOSIS — I6523 Occlusion and stenosis of bilateral carotid arteries: Secondary | ICD-10-CM | POA: Diagnosis not present

## 2019-02-28 DIAGNOSIS — D649 Anemia, unspecified: Secondary | ICD-10-CM

## 2019-02-28 DIAGNOSIS — I10 Essential (primary) hypertension: Secondary | ICD-10-CM

## 2019-02-28 DIAGNOSIS — F411 Generalized anxiety disorder: Secondary | ICD-10-CM

## 2019-02-28 DIAGNOSIS — K222 Esophageal obstruction: Secondary | ICD-10-CM

## 2019-02-28 DIAGNOSIS — J309 Allergic rhinitis, unspecified: Secondary | ICD-10-CM | POA: Diagnosis not present

## 2019-02-28 DIAGNOSIS — E669 Obesity, unspecified: Secondary | ICD-10-CM

## 2019-02-28 DIAGNOSIS — K219 Gastro-esophageal reflux disease without esophagitis: Secondary | ICD-10-CM

## 2019-02-28 DIAGNOSIS — M1990 Unspecified osteoarthritis, unspecified site: Secondary | ICD-10-CM

## 2019-02-28 DIAGNOSIS — J45909 Unspecified asthma, uncomplicated: Secondary | ICD-10-CM

## 2019-02-28 LAB — BASIC METABOLIC PANEL
BUN: 8 mg/dL (ref 6–23)
CO2: 27 mEq/L (ref 19–32)
Calcium: 9.3 mg/dL (ref 8.4–10.5)
Chloride: 107 mEq/L (ref 96–112)
Creatinine, Ser: 0.93 mg/dL (ref 0.40–1.20)
GFR: 69.61 mL/min (ref >60.00)
Glucose, Bld: 93 mg/dL (ref 70–99)
Potassium: 4.2 mEq/L (ref 3.5–5.1)
Sodium: 142 mEq/L (ref 135–145)

## 2019-02-28 LAB — CBC WITH DIFFERENTIAL/PLATELET
Basophils Absolute: 0 10*3/uL (ref 0.0–0.1)
Basophils Relative: 0.5 % (ref 0.0–3.0)
Eosinophils Absolute: 0.3 10*3/uL (ref 0.0–0.7)
Eosinophils Relative: 4.9 % (ref 0.0–5.0)
HCT: 37.6 % (ref 36.0–46.0)
Hemoglobin: 12.3 g/dL (ref 12.0–15.0)
Lymphocytes Relative: 29 % (ref 12.0–46.0)
Lymphs Abs: 2 10*3/uL (ref 0.7–4.0)
MCHC: 32.8 g/dL (ref 30.0–36.0)
MCV: 83.4 fl (ref 78.0–100.0)
Monocytes Absolute: 0.4 10*3/uL (ref 0.1–1.0)
Monocytes Relative: 6 % (ref 3.0–12.0)
Neutro Abs: 4.1 10*3/uL (ref 1.4–7.7)
Neutrophils Relative %: 59.6 % (ref 43.0–77.0)
Platelets: 291 10*3/uL (ref 150.0–400.0)
RBC: 4.51 Mil/uL (ref 3.87–5.11)
RDW: 15 % (ref 11.5–15.5)
WBC: 6.9 10*3/uL (ref 4.0–10.5)

## 2019-02-28 NOTE — Assessment & Plan Note (Signed)
  Nl BP at home -- SBP 130 or less Losartan, Bystolic, Furosemide, Amlodipine 5 mg

## 2019-02-28 NOTE — Assessment & Plan Note (Signed)
Doing well 

## 2019-02-28 NOTE — Progress Notes (Signed)
Subjective:  Patient ID: [name], female    DOB: 1933/04/29  Age: 85 y.o. MRN: [id number]  CC: No chief complaint on file.   HPI STAV DELAROCA presents for allergies, asthma, mild anemia f/u  Outpatient Medications Prior to Visit  Medication Sig Dispense Refill  . acetaminophen (TYLENOL) 325 MG tablet Take 650 mg by mouth every 6 (six) hours as needed.    Marland Kitchen amLODipine (NORVASC) 5 MG tablet TAKE 1 TABLET(5 MG) BY MOUTH DAILY 90 tablet 3  . ATROVENT HFA 17 MCG/ACT inhaler INL 2 PFS PO QID  5  . BREO ELLIPTA 200-25 MCG/INH AEPB INL 1 PUFF PO QD  5  . BYSTOLIC 20 MG TABS TAKE 1 TABLET BY MOUTH EVERY DAY 90 tablet 3  . diclofenac sodium (VOLTAREN) 1 % GEL apply 4 grams to affected area four times a day 200 g 4  . dorzolamide-timolol (COSOPT) 22.3-6.8 MG/ML ophthalmic solution Place 1 drop into both eyes 3 (three) times daily.      Marland Kitchen ezetimibe (ZETIA) 10 MG tablet TAKE 1 TABLET BY MOUTH ONCE DAILY 30 tablet 11  . fexofenadine (ALLEGRA) 180 MG tablet Take 180 mg by mouth daily.      Marland Kitchen FLOVENT HFA 110 MCG/ACT inhaler Inhale 2 puffs into the lungs 2 (two) times daily.  0  . furosemide (LASIX) 40 MG tablet TAKE 1 TABLET BY MOUTH ONCE DAILY 90 tablet 3  . ipratropium (ATROVENT) 0.06 % nasal spray Place 2 sprays into both nostrils as needed for rhinitis.    Marland Kitchen latanoprost (XALATAN) 0.005 % ophthalmic solution INT 1 GTT IN OU D  3  . losartan (COZAAR) 100 MG tablet TAKE 1 TABLET BY MOUTH ONCE DAILY 30 tablet 11  . Multiple Vitamins-Minerals (MULTIVITAMIN WITH MINERALS) tablet Take 1 tablet by mouth daily.    Marland Kitchen omeprazole (PRILOSEC) 40 MG capsule TAKE 1 CAPSULE BY MOUTH ONCE DAILY 30 capsule 11  . pilocarpine (PILOCAR) 4 % ophthalmic solution Place 1 drop into the right eye at bedtime. Reported on 09/05/2015    . prednisoLONE acetate (PRED FORTE) 1 % ophthalmic suspension SHAKE LQ AND INT 1 GTT IN OU QID  0  . sucralfate (CARAFATE) 1 g tablet Take 1 tablet (1 g total) by mouth 4 (four)  times daily -  with meals and at bedtime. 120 tablet 11   No facility-administered medications prior to visit.    ROS: Review of Systems  Constitutional: Negative for activity change, appetite change, chills, fatigue and unexpected weight change.  HENT: Positive for congestion. Negative for mouth sores and sinus pressure.   Eyes: Positive for visual disturbance.  Respiratory: Negative for cough and chest tightness.   Gastrointestinal: Negative for abdominal pain and nausea.  Genitourinary: Negative for difficulty urinating, frequency and vaginal pain.  Musculoskeletal: Positive for arthralgias, back pain and gait problem.  Skin: Negative for pallor and rash.  Neurological: Negative for dizziness, tremors, weakness, numbness and headaches.  Psychiatric/Behavioral: Negative for confusion and sleep disturbance. The patient is nervous/anxious.     Objective:  BP (!) 156/84 (BP Location: Left Arm, Patient Position: Sitting, Cuff Size: Normal)   Pulse 61   Temp 98 F (36.7 C) (Oral)   Ht 5' (1.524 m)   Wt 163 lb (73.9 kg)   SpO2 98%   BMI 31.83 kg/m   BP Readings from Last 3 Encounters:  02/28/19 (!) 156/84  09/08/18 (!) 178/86  03/28/18 (!) 154/78    Wt Readings from Last 3 Encounters:  02/28/19 163 lb (73.9 kg)  09/08/18 167 lb (75.8 kg)  03/28/18 163 lb (73.9 kg)    Physical Exam Constitutional:      General: She is not in acute distress.    Appearance: She is well-developed.  HENT:     Head: Normocephalic.     Right Ear: External ear normal.     Left Ear: External ear normal.     Nose: Nose normal.  Eyes:     General:        Right eye: No discharge.        Left eye: No discharge.     Conjunctiva/sclera: Conjunctivae normal.     Pupils: Pupils are equal, round, and reactive to light.  Neck:     Thyroid: No thyromegaly.     Vascular: No JVD.     Trachea: No tracheal deviation.  Cardiovascular:     Rate and Rhythm: Normal rate and regular rhythm.     Heart  sounds: Normal heart sounds.  Pulmonary:     Effort: No respiratory distress.     Breath sounds: No stridor. No wheezing.  Abdominal:     General: Bowel sounds are normal. There is no distension.     Palpations: Abdomen is soft. There is no mass.     Tenderness: There is no abdominal tenderness. There is no guarding or rebound.  Musculoskeletal:        General: Tenderness present.     Cervical back: Normal range of motion and neck supple.  Lymphadenopathy:     Cervical: No cervical adenopathy.  Skin:    Findings: No erythema or rash.  Neurological:     Cranial Nerves: No cranial nerve deficit.     Motor: No abnormal muscle tone.     Coordination: Coordination normal.     Deep Tendon Reflexes: Reflexes normal.  Psychiatric:        Behavior: Behavior normal.        Thought Content: Thought content normal.        Judgment: Judgment normal.     Lab Results  Component Value Date   WBC 6.6 09/08/2018   HGB 11.9 (L) 09/08/2018   HCT 37.0 09/08/2018   PLT 283.0 09/08/2018   GLUCOSE 88 09/08/2018   CHOL 172 09/08/2018   TRIG 76.0 09/08/2018   HDL 54.00 09/08/2018   LDLCALC 103 (H) 09/08/2018   ALT 9 09/08/2018   AST 14 09/08/2018   NA 144 09/08/2018   K 4.2 09/08/2018   CL 109 09/08/2018   CREATININE 0.78 09/08/2018   BUN 10 09/08/2018   CO2 27 09/08/2018   TSH 1.85 09/08/2018    VAS US CAROTID  Result Date: 10/27/2017 Carotid Arterial Duplex Study Indications:       Carotid artery stenosis. Risk Factors:      Hypertension, no history of smoking. Other Factors:     Dyslipidemia. Comparison Study:  Previous carotid duplex examination performed in 07/2016                    showed the left ICA with velocities of 70/15 cm/sec and the                    left ICA with velocities of 169/44 cm/sec. Performing Technologist: Levy Sjogren RVT  Examination Guidelines: A complete evaluation includes B-mode imaging, spectral Doppler, color Doppler, and power Doppler as needed of all  accessible portions of each vessel. Bilateral testing is considered an integral part  of a complete examination. Limited examinations for reoccurring indications may be performed as noted.  Right Carotid Findings: +----------+--------+--------+--------+------------+--------+           PSV cm/sEDV cm/sStenosisDescribe    Comments +----------+--------+--------+--------+------------+--------+ CCA Prox  75      15                                   +----------+--------+--------+--------+------------+--------+ CCA Distal67      11                                   +----------+--------+--------+--------+------------+--------+ ICA Prox  70      17      1-39%   heterogenous         +----------+--------+--------+--------+------------+--------+ ICA Mid   67      17                          tortuous +----------+--------+--------+--------+------------+--------+ ICA Distal117     31                          tortuous +----------+--------+--------+--------+------------+--------+ ECA       88      22                                   +----------+--------+--------+--------+------------+--------+ +----------+--------+-------+----------------+-------------------+           PSV cm/sEDV cmsDescribe        Arm Pressure (mmHG) +----------+--------+-------+----------------+-------------------+ [id number]             Multiphasic, [id number]                 +----------+--------+-------+----------------+-------------------+ +---------+--------+--+--------+--+---------+ VertebralPSV cm/s62EDV cm/s15Antegrade +---------+--------+--+--------+--+---------+ High bifurcation and tortuous ICA  Left Carotid Findings: +----------+--------+--------+--------+------------+--------+           PSV cm/sEDV cm/sStenosisDescribe    Comments +----------+--------+--------+--------+------------+--------+ CCA Prox  86      17                                    +----------+--------+--------+--------+------------+--------+ CCA Distal70      17                                   +----------+--------+--------+--------+------------+--------+ ICA Prox  55      13              heterogenous         +----------+--------+--------+--------+------------+--------+ ICA Mid   60      20      1-39%               tortuous +----------+--------+--------+--------+------------+--------+ ICA Distal72      22                          tortuous +----------+--------+--------+--------+------------+--------+ ECA       76      10                                   +----------+--------+--------+--------+------------+--------+ +----------+--------+--------+----------------+-------------------+  SubclavianPSV cm/sEDV cm/sDescribe        Arm Pressure (mmHG) +----------+--------+--------+----------------+-------------------+           63              Multiphasic, [id number]                 +----------+--------+--------+----------------+-------------------+ +---------+--------+--+--------+--+---------+ VertebralPSV cm/s45EDV cm/s11Antegrade +---------+--------+--+--------+--+---------+ High bifurcation and tortuous ICA  Final Interpretation: Right Carotid: Velocities in the right ICA are consistent with a 1-39% stenosis. Left Carotid: Velocities in the left ICA are consistent with a 1-39% stenosis. Vertebrals:  Bilateral vertebral arteries demonstrate antegrade flow. Subclavians: Normal flow hemodynamics were seen in bilateral subclavian              arteries. *See table(s) above for measurements and observations.  Electronically signed by Tobias Alexander MD on 10/27/2017 at 2:54:00 PM.    Final     Assessment & Plan:   There are no diagnoses linked to this encounter.   No orders of the defined types were placed in this encounter.    Follow-up: No follow-ups on file.  Sonda Primes, MD

## 2019-02-28 NOTE — Assessment & Plan Note (Signed)
  On diet  

## 2019-02-28 NOTE — Patient Instructions (Signed)
If you have medicare related insurance (such as traditional Medicare, Blue Cross Medicare, [employer] Medicare, or similar), Please make an appointment at the scheduling desk with Jill, the Wellness Health Coach, for your Wellness visit in this office, which is a benefit with your insurance.  

## 2019-02-28 NOTE — Assessment & Plan Note (Signed)
On Omeprazole, Carafate

## 2019-02-28 NOTE — Assessment & Plan Note (Signed)
H/o GI blood loss

## 2019-02-28 NOTE — Assessment & Plan Note (Signed)
On Breo 

## 2019-02-28 NOTE — Assessment & Plan Note (Addendum)
Voltaren gel Knees, hands

## 2019-02-28 NOTE — Assessment & Plan Note (Signed)
Pt declined statins in the past Declined ASA prior On zetia 10 mg

## 2019-03-01 LAB — IRON,TIBC AND FERRITIN PANEL
%SAT: 16 % (calc) (ref 16–45)
Ferritin: 28 ng/mL (ref 16–288)
Iron: 49 ug/dL (ref 45–160)
TIBC: 303 mcg/dL (calc) (ref 250–450)

## 2019-03-02 DIAGNOSIS — Z780 Asymptomatic menopausal state: Secondary | ICD-10-CM | POA: Insufficient documentation

## 2019-03-12 ENCOUNTER — Other Ambulatory Visit: Payer: Self-pay | Admitting: Internal Medicine

## 2019-03-15 ENCOUNTER — Telehealth: Payer: Self-pay | Admitting: Internal Medicine

## 2019-03-15 NOTE — Telephone Encounter (Signed)
Patient calling for recent lab results. Please advise.

## 2019-03-15 NOTE — Telephone Encounter (Signed)
Pt.notified

## 2019-04-10 ENCOUNTER — Telehealth: Payer: Self-pay | Admitting: Internal Medicine

## 2019-04-10 NOTE — Telephone Encounter (Signed)
I called her - she has a foreign body sensation in right neck when swallowing  I suppose it is possible that the gum is lodged there  Please refer to ENT to be seen this week (I hope) about this.

## 2019-04-10 NOTE — Telephone Encounter (Signed)
Dr Carlean Purl,  Nevin Bloodgood is off today. Please review this for me. This very nice patient is calling with complaints of dysphagia. No regurgitation of liquids or solids. She reports it feels like something is stuck in her throat. Patient fell asleep with gum in her mouth over a week ago. When she woke up the gum was gone. She is certain she swallowed it. Please advise?

## 2019-04-10 NOTE — Telephone Encounter (Signed)
Please refer to Epic, last OV with Tye Savoy on 08/05/16, please forward to the correct nurse. Thank you.

## 2019-04-11 NOTE — Telephone Encounter (Signed)
[facility] Patient went there "in the past." Referral placed. Notes faxed. They will contact her directly. Included my desk number in the referral incase there are any questions. The patient is aware.

## 2019-04-26 DIAGNOSIS — T17208A Unspecified foreign body in pharynx causing other injury, initial encounter: Secondary | ICD-10-CM | POA: Insufficient documentation

## 2019-04-26 DIAGNOSIS — H938X3 Other specified disorders of ear, bilateral: Secondary | ICD-10-CM | POA: Diagnosis not present

## 2019-04-26 DIAGNOSIS — J342 Deviated nasal septum: Secondary | ICD-10-CM | POA: Diagnosis not present

## 2019-04-26 DIAGNOSIS — K219 Gastro-esophageal reflux disease without esophagitis: Secondary | ICD-10-CM | POA: Diagnosis not present

## 2019-04-26 DIAGNOSIS — J387 Other diseases of larynx: Secondary | ICD-10-CM | POA: Diagnosis not present

## 2019-04-28 ENCOUNTER — Other Ambulatory Visit: Payer: Self-pay | Admitting: Otolaryngology

## 2019-04-28 DIAGNOSIS — T17208A Unspecified foreign body in pharynx causing other injury, initial encounter: Secondary | ICD-10-CM

## 2019-05-09 ENCOUNTER — Telehealth: Payer: Self-pay

## 2019-05-09 NOTE — Telephone Encounter (Signed)
New message    The patient is calling asking about the COVID vaccine shot.   Please advise

## 2019-05-09 NOTE — Telephone Encounter (Signed)
Pt notified that it is okay for her to receive vaccine

## 2019-05-11 ENCOUNTER — Other Ambulatory Visit: Payer: Self-pay | Admitting: Internal Medicine

## 2019-05-11 DIAGNOSIS — Z23 Encounter for immunization: Secondary | ICD-10-CM | POA: Diagnosis not present

## 2019-05-15 ENCOUNTER — Other Ambulatory Visit: Payer: Self-pay | Admitting: Internal Medicine

## 2019-05-25 DIAGNOSIS — N8189 Other female genital prolapse: Secondary | ICD-10-CM | POA: Diagnosis not present

## 2019-06-05 DIAGNOSIS — H401131 Primary open-angle glaucoma, bilateral, mild stage: Secondary | ICD-10-CM | POA: Diagnosis not present

## 2019-06-08 DIAGNOSIS — Z23 Encounter for immunization: Secondary | ICD-10-CM | POA: Diagnosis not present

## 2019-07-15 ENCOUNTER — Other Ambulatory Visit: Payer: Self-pay | Admitting: Internal Medicine

## 2019-08-31 ENCOUNTER — Other Ambulatory Visit: Payer: Self-pay

## 2019-08-31 ENCOUNTER — Ambulatory Visit (INDEPENDENT_AMBULATORY_CARE_PROVIDER_SITE_OTHER): Payer: Medicare Other

## 2019-08-31 ENCOUNTER — Ambulatory Visit (INDEPENDENT_AMBULATORY_CARE_PROVIDER_SITE_OTHER): Payer: Medicare Other | Admitting: Internal Medicine

## 2019-08-31 ENCOUNTER — Encounter: Payer: Self-pay | Admitting: Internal Medicine

## 2019-08-31 VITALS — BP 160/85 | HR 57 | Temp 98.3°F | Ht 60.0 in | Wt 168.0 lb

## 2019-08-31 DIAGNOSIS — E669 Obesity, unspecified: Secondary | ICD-10-CM

## 2019-08-31 DIAGNOSIS — I10 Essential (primary) hypertension: Secondary | ICD-10-CM | POA: Diagnosis not present

## 2019-08-31 DIAGNOSIS — E785 Hyperlipidemia, unspecified: Secondary | ICD-10-CM | POA: Diagnosis not present

## 2019-08-31 DIAGNOSIS — Z0000 Encounter for general adult medical examination without abnormal findings: Secondary | ICD-10-CM | POA: Diagnosis not present

## 2019-08-31 LAB — BASIC METABOLIC PANEL
BUN: 11 mg/dL (ref 6–23)
CO2: 31 mEq/L (ref 19–32)
Calcium: 9.6 mg/dL (ref 8.4–10.5)
Chloride: 105 mEq/L (ref 96–112)
Creatinine, Ser: 0.8 mg/dL (ref 0.40–1.20)
GFR: 82.71 mL/min (ref >60.00)
Glucose, Bld: 89 mg/dL (ref 70–99)
Potassium: 4.1 mEq/L (ref 3.5–5.1)
Sodium: 141 mEq/L (ref 135–145)

## 2019-08-31 NOTE — Patient Instructions (Addendum)
[name] , Thank you for taking time to come for your Medicare Wellness Visit. I appreciate your ongoing commitment to your health goals. Please review the following plan we discussed and let me know if I can assist you in the future.   Screening recommendations/referrals: Colonoscopy: no repeat due to age 86: 10/31/2014; will schedule with OB/GYN next week Bone Density: 12/08/2016 Recommended yearly ophthalmology/optometry visit for glaucoma screening and checkup Recommended yearly dental visit for hygiene and checkup  Vaccinations: Influenza vaccine: 11/19/2018 Pneumococcal vaccine: completed Tdap vaccine: 08/20/2011 Shingles vaccine: never done per pharmacy Covid-19: completed  Advanced directives: Advance directive discussed with you today. Even though you declined this today please call our office should you change your mind and we can give you the proper paperwork for you to fill out.  Conditions/risks identified: Please continue to do your personal lifestyle choices by: daily care of teeth and gums, regular physical activity (goal should be 5 days a week for 30 minutes), eat a healthy diet, avoid tobacco and drug use, limiting any alcohol intake, taking a low-dose aspirin (if not allergic or have been advised by your provider otherwise) and taking vitamins and minerals as recommended by your provider.  Next appointment: Please schedule your next Medicare Wellness Visit with your Nurse Health Advisor in 1 year.  Preventive Care 55 Years and Older, Female Preventive care refers to lifestyle choices and visits with your health care provider that can promote health and wellness. What does preventive care include?  A yearly physical exam. This is also called an annual well check.  Dental exams once or twice a year.  Routine eye exams. Ask your health care provider how often you should have your eyes checked.  Personal lifestyle choices, including:  Daily care of your teeth  and gums.  Regular physical activity.  Eating a healthy diet.  Avoiding tobacco and drug use.  Limiting alcohol use.  Practicing safe sex.  Taking low-dose aspirin every day.  Taking vitamin and mineral supplements as recommended by your health care provider. What happens during an annual well check? The services and screenings done by your health care provider during your annual well check will depend on your age, overall health, lifestyle risk factors, and family history of disease. Counseling  Your health care provider may ask you questions about your:  Alcohol use.  Tobacco use.  Drug use.  Emotional well-being.  Home and relationship well-being.  Sexual activity.  Eating habits.  History of falls.  Memory and ability to understand (cognition).  Work and work Statistician.  Reproductive health. Screening  You may have the following tests or measurements:  Height, weight, and BMI.  Blood pressure.  Lipid and cholesterol levels. These may be checked every 5 years, or more frequently if you are over 30 years old.  Skin check.  Lung cancer screening. You may have this screening every year starting at age 34 if you have a 30-pack-year history of smoking and currently smoke or have quit within the past 15 years.  Fecal occult blood test (FOBT) of the stool. You may have this test every year starting at age 44.  Flexible sigmoidoscopy or colonoscopy. You may have a sigmoidoscopy every 5 years or a colonoscopy every 10 years starting at age 33.  Hepatitis C blood test.  Hepatitis B blood test.  Sexually transmitted disease (STD) testing.  Diabetes screening. This is done by checking your blood sugar (glucose) after you have not eaten for a while (fasting). You may  have this done every 1-3 years.  Bone density scan. This is done to screen for osteoporosis. You may have this done starting at age 50.  Mammogram. This may be done every 1-2 years. Talk to  your health care provider about how often you should have regular mammograms. Talk with your health care provider about your test results, treatment options, and if necessary, the need for more tests. Vaccines  Your health care provider may recommend certain vaccines, such as:  Influenza vaccine. This is recommended every year.  Tetanus, diphtheria, and acellular pertussis (Tdap, Td) vaccine. You may need a Td booster every 10 years.  Zoster vaccine. You may need this after age 29.  Pneumococcal 13-valent conjugate (PCV13) vaccine. One dose is recommended after age 14.  Pneumococcal polysaccharide (PPSV23) vaccine. One dose is recommended after age 35. Talk to your health care provider about which screenings and vaccines you need and how often you need them. This information is not intended to replace advice given to you by your health care provider. Make sure you discuss any questions you have with your health care provider. Document Released: 03/29/2015 Document Revised: 11/20/2015 Document Reviewed: 01/01/2015 Elsevier Interactive Patient Education  2017 [place] Prevention in the Home Falls can cause injuries. They can happen to people of all ages. There are many things you can do to make your home safe and to help prevent falls. What can I do on the outside of my home?  Regularly fix the edges of walkways and driveways and fix any cracks.  Remove anything that might make you trip as you walk through a door, such as a raised step or threshold.  Trim any bushes or trees on the path to your home.  Use bright outdoor lighting.  Clear any walking paths of anything that might make someone trip, such as rocks or tools.  Regularly check to see if handrails are loose or broken. Make sure that both sides of any steps have handrails.  Any raised decks and porches should have guardrails on the edges.  Have any leaves, snow, or ice cleared regularly.  Use sand or salt on  walking paths during winter.  Clean up any spills in your garage right away. This includes oil or grease spills. What can I do in the bathroom?  Use night lights.  Install grab bars by the toilet and in the tub and shower. Do not use towel bars as grab bars.  Use non-skid mats or decals in the tub or shower.  If you need to sit down in the shower, use a plastic, non-slip stool.  Keep the floor dry. Clean up any water that spills on the floor as soon as it happens.  Remove soap buildup in the tub or shower regularly.  Attach bath mats securely with double-sided non-slip rug tape.  Do not have throw rugs and other things on the floor that can make you trip. What can I do in the bedroom?  Use night lights.  Make sure that you have a light by your bed that is easy to reach.  Do not use any sheets or blankets that are too big for your bed. They should not hang down onto the floor.  Have a firm chair that has side arms. You can use this for support while you get dressed.  Do not have throw rugs and other things on the floor that can make you trip. What can I do in the kitchen?  Clean up  any spills right away.  Avoid walking on wet floors.  Keep items that you use a lot in easy-to-reach places.  If you need to reach something above you, use a strong step stool that has a grab bar.  Keep electrical cords out of the way.  Do not use floor polish or wax that makes floors slippery. If you must use wax, use non-skid floor wax.  Do not have throw rugs and other things on the floor that can make you trip. What can I do with my stairs?  Do not leave any items on the stairs.  Make sure that there are handrails on both sides of the stairs and use them. Fix handrails that are broken or loose. Make sure that handrails are as long as the stairways.  Check any carpeting to make sure that it is firmly attached to the stairs. Fix any carpet that is loose or worn.  Avoid having throw  rugs at the top or bottom of the stairs. If you do have throw rugs, attach them to the floor with carpet tape.  Make sure that you have a light switch at the top of the stairs and the bottom of the stairs. If you do not have them, ask someone to add them for you. What else can I do to help prevent falls?  Wear shoes that:  Do not have high heels.  Have rubber bottoms.  Are comfortable and fit you well.  Are closed at the toe. Do not wear sandals.  If you use a stepladder:  Make sure that it is fully opened. Do not climb a closed stepladder.  Make sure that both sides of the stepladder are locked into place.  Ask someone to hold it for you, if possible.  Clearly mark and make sure that you can see:  Any grab bars or handrails.  First and last steps.  Where the edge of each step is.  Use tools that help you move around (mobility aids) if they are needed. These include:  Canes.  Walkers.  Scooters.  Crutches.  Turn on the lights when you go into a dark area. Replace any light bulbs as soon as they burn out.  Set up your furniture so you have a clear path. Avoid moving your furniture around.  If any of your floors are uneven, fix them.  If there are any pets around you, be aware of where they are.  Review your medicines with your doctor. Some medicines can make you feel dizzy. This can increase your chance of falling. Ask your doctor what other things that you can do to help prevent falls. This information is not intended to replace advice given to you by your health care provider. Make sure you discuss any questions you have with your health care provider. Document Released: 12/27/2008 Document Revised: 08/08/2015 Document Reviewed: 04/06/2014 Elsevier Interactive Patient Education  2017 [employer].

## 2019-08-31 NOTE — Assessment & Plan Note (Signed)
Wt Readings from Last 3 Encounters:  08/31/19 168 lb (76.2 kg)  02/28/19 163 lb (73.9 kg)  09/08/18 167 lb (75.8 kg)

## 2019-08-31 NOTE — Assessment & Plan Note (Signed)
Nl BP at home -- SBP 130 or less Losartan, Bystolic, Furosemide, Amlodipine 5 mg Check BP at home. Call if up

## 2019-08-31 NOTE — Assessment & Plan Note (Signed)
Declined ASA On zetia 10 mg

## 2019-08-31 NOTE — Progress Notes (Addendum)
Subjective:   [name] is a 86 y.o. female who presents for Medicare Annual (Subsequent) preventive examination.  Review of Systems:  No ROS. Medicare Wellness Visit Cardiac Risk Factors include: advanced age (>58men, >19 women);dyslipidemia;family history of premature cardiovascular disease;hypertension;obesity (BMI >30kg/m2)     Objective:     Vitals: BP (!) 160/85   Pulse (!) 57   Temp 98.3 F (36.8 C)   Ht 5' (1.524 m)   Wt 168 lb (76.2 kg)   SpO2 98%   BMI 32.81 kg/m   Body mass index is 32.81 kg/m.  Advanced Directives 08/31/2019 03/28/2018 09/02/2016  Does Patient Have a Medical Advance Directive? No No Yes  Type of Advance Directive - Public librarian;Living will  Does patient want to make changes to medical advance directive? No - Patient declined Yes (ED - Information included in AVS) -  Copy of [place] in Chart? 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ell [employer], LPN  Past Medical History:  Diagnosis Date  . Allergy   . Allergy, unspecified not elsewhere classified   . Anxiety   . Asthma   . Cataract    bil cataracts removed  . Diverticulitis   . Diverticulosis of colon (without mention of hemorrhage)   . DJD (degenerative joint disease)   . Gastritis   . GERD (gastroesophageal reflux disease)     . Glaucoma   . History of GI diverticular bleed   . HTN (hypertension)   . Hyperlipidemia   . LPRD (laryngopharyngeal reflux disease)    Past Surgical History:  Procedure Laterality Date  . BLADDER REPAIR     after perforation  . cataract surgery  2011   w/ IOL Gershon Crane)  . COLONOSCOPY    . KIDNEY SURGERY    . KNEE ARTHROSCOPY  1998   left  . TOTAL ABDOMINAL HYSTERECTOMY     fibroid tumor  . UPPER GASTROINTESTINAL ENDOSCOPY    . WISDOM TOOTH EXTRACTION     Family History  Problem Relation Age of Onset  . Heart disease Father        ??  . Other Mother        bladder tumor  . Arthritis Mother   . Cancer Sister   . Cancer Brother   . Colon cancer Neg Hx   . Esophageal cancer Neg Hx   . Pancreatic cancer Neg Hx   . Prostate cancer Neg Hx   . Rectal cancer Neg Hx   . Stomach cancer Neg Hx    Social History   Socioeconomic History  . Marital status: Widowed    Spouse name: Not on file  . Number of children: 4  . Years of education: 69  . Highest education level: High school graduate  Occupational History  .  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 Occupation: Airline pilot: Programmer, applications  Tobacco Use  . Smoking status: Never Smoker  . Smokeless tobacco: Never Used  Vaping Use  . Vaping Use: Never used  Substance and Sexual Activity  . Alcohol use: No  . Drug use: No  . Sexual activity: Never  Other Topics Concern  . Not on file  Social History Narrative   HSG. Married '56 - '87, widowed. 2 sons > '53, '62. 2 daughters > '59, '61. 9 granddaughters. 12 great-grandchildren. Lives in own home and son lives with her.  Retired.   ACP - asked but not answered (June '14). Referred to [email or web address].   Social Determinants of Health   Financial Resource Strain:   . Difficulty of Paying Living Expenses:   Food Insecurity:   . Worried About Charity fundraiser in the Last Year:   . Arboriculturist in the Last Year:   Transportation Needs:    . Film/video editor (Medical):   Marland Kitchen Lack of Transportation (Non-Medical):   Physical Activity:   . Days of Exercise per Week:   . Minutes of Exercise per Session:   Stress:   . Feeling of Stress :   Social Connections:   . Frequency of Communication with Friends and Family:   . Frequency of Social Gatherings with Friends and Family:   . Attends Religious Services:   . Active Member of Clubs or Organizations:   . Attends Archivist Meetings:   Marland Kitchen Marital Status:     Outpatient Encounter Medications as of 08/31/2019  Medication Sig  . acetaminophen (TYLENOL) 325 MG tablet Take 650 mg by mouth every 6 (six) hours as needed.  Marland Kitchen amLODipine (NORVASC) 5 MG tablet TAKE 1 TABLET(5 MG) BY MOUTH DAILY  . ATROVENT HFA 17 MCG/ACT inhaler INL 2 PFS PO QID  . BREO ELLIPTA 200-25 MCG/INH AEPB INL 1 PUFF PO QD  . BYSTOLIC 20 MG TABS TAKE 1 TABLET BY MOUTH EVERY DAY  . diclofenac sodium (VOLTAREN) 1 % GEL apply 4 grams to affected area four times a day  . dorzolamide-timolol (COSOPT) 22.3-6.8 MG/ML ophthalmic solution Place 1 drop into both eyes 3 (three) times daily.    Marland Kitchen ezetimibe (ZETIA) 10 MG tablet TAKE 1 TABLET BY MOUTH EVERY DAY  . fexofenadine (ALLEGRA) 180 MG tablet Take 180 mg by mouth daily.    Marland Kitchen FLOVENT HFA 110 MCG/ACT inhaler Inhale 2 puffs into the lungs 2 (two) times daily.  . furosemide (LASIX) 40 MG tablet TAKE 1 TABLET BY MOUTH EVERY DAY  . ipratropium (ATROVENT) 0.06 % nasal spray Place 2 sprays into both nostrils as needed for rhinitis.  Marland Kitchen latanoprost (XALATAN) 0.005 % ophthalmic solution INT 1 GTT IN OU D  . losartan (COZAAR) 100 MG tablet TAKE 1 TABLET BY MOUTH EVERY DAY  . Multiple Vitamins-Minerals (MULTIVITAMIN WITH MINERALS) tablet Take 1 tablet by mouth daily.  Marland Kitchen omeprazole (PRILOSEC) 40 MG capsule TAKE 1 CAPSULE BY MOUTH ONCE DAILY  . pilocarpine (PILOCAR) 4 % ophthalmic solution Place 1 drop into the right eye at bedtime. Reported on 09/05/2015  .  prednisoLONE acetate (PRED FORTE) 1 % ophthalmic suspension SHAKE LQ AND INT 1 GTT IN OU QID  . sucralfate (CARAFATE) 1 g tablet Take 1 tablet (1 g total) by mouth 4 (four) times daily -  with meals and at bedtime.   No facility-administered encounter medications on file as of 08/31/2019.  Activities of Daily Living In your present state of health, do you have any difficulty performing the following activities: 08/31/2019  Hearing? N  Vision? N  Difficulty concentrating or making decisions? N  Walking or climbing stairs? N  Dressing or bathing? N  Doing errands, shopping? N  Preparing Food and eating ? N  Using the Toilet? N  In the past six months, have you accidently leaked urine? N  Do you have problems with loss of bowel control? N  Managing your Medications? N  Managing your Finances? N  Housekeeping or managing your Housekeeping? N  Some recent data might be hidden    Patient Care Team: Plotnikov, Evie Lacks, MD as PCP - General (Internal Medicine) Gatha Mayer, MD as Consulting Physician (Gastroenterology)    Assessment:   This is a routine wellness examination for Deara.  Exercise Activities and Dietary recommendations Current Exercise Habits: The patient does not participate in regular exercise at present (walk around her house and do house work), Exercise limited by: respiratory conditions(s);psychological condition(s);orthopedic condition(s)  Goals    .  I want to travel      I am going [place] to visits my sisters, visit my nieces, go to [place]. 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    .  Patient Stated      Continue to visit my sisters and family. Stay physically and socially active. Love God and love family.    .  Patient Stated (pt-stated)      Will continue to read and pray to God because my mind in intact and I'm still mobile and active.       Fall Risk Fall Risk  08/31/2019 03/28/2018 02/21/2018 12/21/2017  10/02/2016  Falls in the past year? 0 0 0 No No  Comment - - - - Emmi Telephone Survey: data to providers prior to load  Number falls in past yr: 0 - 0 - -  Injury with Fall? 0 - 0 - -  Risk for fall due to : No Fall Risks Impaired balance/gait - - -  Follow up Falls evaluation completed Falls prevention discussed Falls evaluation completed - -   Is the patient's home free of loose throw rugs in walkways, pet beds, electrical cords, etc?   yes      Grab bars in the bathroom? yes      Handrails on the stairs?   yes      Adequate lighting?   yes  Timed Get Up and Go performed: not indicated  Depression Screen PHQ 2/9 Scores 08/31/2019 03/28/2018 12/21/2017 09/02/2016  PHQ - 2 Score 0 0 0 0     Cognitive Function: patient is cogitatively intact MMSE - Mini Mental State Exam 08/31/2019 09/02/2016  Not completed: Refused -  Orientation to time - 5  Orientation to Place - 5  Registration - 3  Attention/ Calculation - 5  Recall - 2  Language- name 2 objects - 2  Language- repeat - 1  Language- follow 3 step command - 3  Language- read & follow direction - 1  Write a sentence - 1  Copy design - 1  Total score - 29        Immunization History  Administered Date(s) Administered  . Fluad Quad(high Dose 65+) 11/19/2018  . Influenza Split 11/26/2010, 01/25/2012  . Influenza Whole 12/12/2007, 01/28/2009, 11/14/2009  . Influenza, High Dose Seasonal PF 12/19/2012, 01/09/2016, 01/06/2017, 12/21/2017  . Influenza,inj,Quad  PF,6+ Mos 12/06/2014  . Influenza-Unspecified 12/23/2013  . PFIZER SARS-COV-2 Vaccination 05/11/2019, 06/08/2019  . Pneumococcal Conjugate-13 05/30/2014  . Pneumococcal Polysaccharide-23 06/17/2002, 09/05/2015  . Td 08/20/2011  . Zoster 10/13/2011, 09/29/2012    Qualifies for Shingles Vaccine? Yes  Screening Tests Health Maintenance  Topic Date Due  . INFLUENZA VACCINE  10/15/2019  . TETANUS/TDAP  08/19/2021  . DEXA SCAN  12/08/2021  . COVID-19 Vaccine   Completed  . PNA vac Low Risk Adult  Completed    Cancer Screenings: Lung: Low Dose CT Chest recommended if Age 47-80 years, 30 pack-year currently smoking OR have quit w/in 15years. Patient does not qualify. Breast:  Up to date on Mammogram? No   Up to date of Bone Density/Dexa? No Colorectal: not recommended due to age  Additional Screenings: Hepatitis C Screening: Never done     Plan:     Reviewed health maintenance screenings with patient today and relevant education, vaccines, and/or referrals were provided.    Continue doing brain stimulating activities (puzzles, reading, adult coloring books, staying active) to keep memory sharp.    Continue to eat heart healthy diet (full of fruits, vegetables, whole grains, lean protein, water--limit salt, fat, and sugar intake) and increase physical activity as tolerated.  I have personally reviewed and noted the following in the patient's chart:   . Medical and social history . Use of alcohol, tobacco or illicit drugs  . Current medications and supplements . Functional ability and status . Nutritional status . Physical activity . Advanced directives . List of other physicians . Hospitalizations, surgeries, and ER visits in previous 12 months . Vitals . Screenings to include cognitive, depression, and falls . Referrals and appointments  In addition, I have reviewed and discussed with patient certain preventive protocols, quality metrics, and best practice recommendations. A written personalized care plan for preventive services as well as general preventive health recommendations were provided to patient.     Sheral Flow, LPN  04/[birth year]69  Nurse Health Advisor

## 2019-08-31 NOTE — Progress Notes (Signed)
Subjective:  Patient ID: [name], female    DOB: [birth year]  Age: 86 y.o. MRN: [id number]  CC: No chief complaint on file.   HPI [name] presents for HTN, asthma SBP 130 at home  Outpatient Medications Prior to Visit  Medication Sig Dispense Refill  . acetaminophen (TYLENOL) 325 MG tablet Take 650 mg by mouth every 6 (six) hours as needed.    Marland Kitchen amLODipine (NORVASC) 5 MG tablet TAKE 1 TABLET(5 MG) BY MOUTH DAILY 90 tablet 5  . ATROVENT HFA 17 MCG/ACT inhaler INL 2 PFS PO QID  5  . BREO ELLIPTA 200-25 MCG/INH AEPB INL 1 PUFF PO QD  5  . BYSTOLIC 20 MG TABS TAKE 1 TABLET BY MOUTH EVERY DAY 90 tablet 3  . diclofenac sodium (VOLTAREN) 1 % GEL apply 4 grams to affected area four times a day 200 g 4  . dorzolamide-timolol (COSOPT) 22.3-6.8 MG/ML ophthalmic solution Place 1 drop into both eyes 3 (three) times daily.      Marland Kitchen ezetimibe (ZETIA) 10 MG tablet TAKE 1 TABLET BY MOUTH EVERY DAY 30 tablet 11  . fexofenadine (ALLEGRA) 180 MG tablet Take 180 mg by mouth daily.      Marland Kitchen FLOVENT HFA 110 MCG/ACT inhaler Inhale 2 puffs into the lungs 2 (two) times daily.  0  . furosemide (LASIX) 40 MG tablet TAKE 1 TABLET BY MOUTH EVERY DAY 90 tablet 1  . ipratropium (ATROVENT) 0.06 % nasal spray Place 2 sprays into both nostrils as needed for rhinitis.    Marland Kitchen latanoprost (XALATAN) 0.005 % ophthalmic solution INT 1 GTT IN OU D  3  . losartan (COZAAR) 100 MG tablet TAKE 1 TABLET BY MOUTH EVERY DAY 90 tablet 3  . Multiple Vitamins-Minerals (MULTIVITAMIN WITH MINERALS) tablet Take 1 tablet by mouth daily.    Marland Kitchen omeprazole (PRILOSEC) 40 MG capsule TAKE 1 CAPSULE BY MOUTH ONCE DAILY 30 capsule 11  . pilocarpine (PILOCAR) 4 % ophthalmic solution Place 1 drop into the right eye at bedtime. Reported on 09/05/2015    . prednisoLONE acetate (PRED FORTE) 1 % ophthalmic suspension SHAKE LQ AND INT 1 GTT IN OU QID  0  . sucralfate (CARAFATE) 1 g tablet Take 1 tablet (1 g total) by mouth 4 (four) times daily -   with meals and at bedtime. 120 tablet 11   No facility-administered medications prior to visit.    ROS: Review of Systems  Constitutional: Negative for activity change, appetite change, chills, fatigue and unexpected weight change.  HENT: Negative for congestion, mouth sores and sinus pressure.   Eyes: Negative for visual disturbance.  Respiratory: Negative for cough and chest tightness.   Gastrointestinal: Negative for abdominal pain and nausea.  Genitourinary: Negative for difficulty urinating, frequency and vaginal pain.  Musculoskeletal: Positive for arthralgias. Negative for back pain and gait problem.  Skin: Negative for pallor and rash.  Neurological: Negative for dizziness, tremors, weakness, numbness and headaches.  Psychiatric/Behavioral: Negative for confusion and sleep disturbance.    Objective:  BP (!) 180/94 (BP Location: Right Arm, Patient Position: Sitting, Cuff Size: Normal)   Pulse (!) 57   Temp 98.3 F (36.8 C) (Oral)   Ht 5' (1.524 m)   Wt 168 lb (76.2 kg)   SpO2 98%   BMI 32.81 kg/m   BP Readings from Last 3 Encounters:  08/31/19 (!) 180/94  02/28/19 (!) 156/84  09/08/18 (!) 178/86    Wt Readings from Last 3 Encounters:  08/31/19 168  lb (76.2 kg)  02/28/19 163 lb (73.9 kg)  09/08/18 167 lb (75.8 kg)    Physical Exam Constitutional:      General: She is not in acute distress.    Appearance: She is well-developed. She is obese.  HENT:     Head: Normocephalic.     Right Ear: External ear normal.     Left Ear: External ear normal.     Nose: Nose normal.  Eyes:     General:        Right eye: No discharge.        Left eye: No discharge.     Conjunctiva/sclera: Conjunctivae normal.     Pupils: Pupils are equal, round, and reactive to light.  Neck:     Thyroid: No thyromegaly.     Vascular: No JVD.     Trachea: No tracheal deviation.  Cardiovascular:     Rate and Rhythm: Normal rate and regular rhythm.     Heart sounds: Normal heart sounds.    Pulmonary:     Effort: No respiratory distress.     Breath sounds: No stridor. No wheezing.  Abdominal:     General: Bowel sounds are normal. There is no distension.     Palpations: Abdomen is soft. There is no mass.     Tenderness: There is no abdominal tenderness. There is no guarding or rebound.  Musculoskeletal:        General: No tenderness.     Cervical back: Normal range of motion and neck supple.  Lymphadenopathy:     Cervical: No cervical adenopathy.  Skin:    Findings: No erythema or rash.  Neurological:     Cranial Nerves: No cranial nerve deficit.     Motor: No abnormal muscle tone.     Coordination: Coordination normal.     Gait: Gait abnormal.     Deep Tendon Reflexes: Reflexes normal.  Psychiatric:        Behavior: Behavior normal.        Thought Content: Thought content normal.        Judgment: Judgment normal.     Lab Results  Component Value Date   WBC 6.9 02/28/2019   HGB 12.3 02/28/2019   HCT 37.6 02/28/2019   PLT 291.0 02/28/2019   GLUCOSE 93 02/28/2019   CHOL 172 09/08/2018   TRIG 76.0 09/08/2018   HDL 54.00 09/08/2018   LDLCALC 103 (H) 09/08/2018   ALT 9 09/08/2018   AST 14 09/08/2018   NA 142 02/28/2019   K 4.2 02/28/2019   CL 107 02/28/2019   CREATININE 0.93 02/28/2019   BUN 8 02/28/2019   CO2 27 02/28/2019   TSH 1.85 09/08/2018    VAS US CAROTID  Result Date: 10/27/2017 Carotid Arterial Duplex Study Indications:       Carotid artery stenosis. Risk Factors:      Hypertension, no history of smoking. Other Factors:     Dyslipidemia. Comparison Study:  Previous carotid duplex examination performed in 07/2016                    showed the left ICA with velocities of 70/15 cm/sec and the                    left ICA with velocities of 169/44 cm/sec. Performing Technologist: Levy Sjogren RVT  Examination Guidelines: A complete evaluation includes B-mode imaging, spectral Doppler, color Doppler, and power Doppler as needed of all accessible  portions of each  vessel. Bilateral testing is considered an integral part of a complete examination. Limited examinations for reoccurring indications may be performed as noted.  Right Carotid Findings: +----------+--------+--------+--------+------------+--------+           PSV cm/sEDV cm/sStenosisDescribe    Comments +----------+--------+--------+--------+------------+--------+ CCA Prox  75      15                                   +----------+--------+--------+--------+------------+--------+ CCA Distal67      11                                   +----------+--------+--------+--------+------------+--------+ ICA Prox  70      17      1-39%   heterogenous         +----------+--------+--------+--------+------------+--------+ ICA Mid   67      17                          tortuous +----------+--------+--------+--------+------------+--------+ ICA Distal117     31                          tortuous +----------+--------+--------+--------+------------+--------+ ECA       88      22                                   +----------+--------+--------+--------+------------+--------+ +----------+--------+-------+----------------+-------------------+           PSV cm/sEDV cmsDescribe        Arm Pressure (mmHG) +----------+--------+-------+----------------+-------------------+ [id number]             Multiphasic, [id number]                 +----------+--------+-------+----------------+-------------------+ +---------+--------+--+--------+--+---------+ VertebralPSV cm/s62EDV cm/s15Antegrade +---------+--------+--+--------+--+---------+ High bifurcation and tortuous ICA  Left Carotid Findings: +----------+--------+--------+--------+------------+--------+           PSV cm/sEDV cm/sStenosisDescribe    Comments +----------+--------+--------+--------+------------+--------+ CCA Prox  86      17                                    +----------+--------+--------+--------+------------+--------+ CCA Distal70      17                                   +----------+--------+--------+--------+------------+--------+ ICA Prox  55      13              heterogenous         +----------+--------+--------+--------+------------+--------+ ICA Mid   60      20      1-39%               tortuous +----------+--------+--------+--------+------------+--------+ ICA Distal72      22                          tortuous +----------+--------+--------+--------+------------+--------+ ECA       76      10                                   +----------+--------+--------+--------+------------+--------+ +----------+--------+--------+----------------+-------------------+  SubclavianPSV cm/sEDV cm/sDescribe        Arm Pressure (mmHG) +----------+--------+--------+----------------+-------------------+           63              Multiphasic, [id number]                 +----------+--------+--------+----------------+-------------------+ +---------+--------+--+--------+--+---------+ VertebralPSV cm/s45EDV cm/s11Antegrade +---------+--------+--+--------+--+---------+ High bifurcation and tortuous ICA  Final Interpretation: Right Carotid: Velocities in the right ICA are consistent with a 1-39% stenosis. Left Carotid: Velocities in the left ICA are consistent with a 1-39% stenosis. Vertebrals:  Bilateral vertebral arteries demonstrate antegrade flow. Subclavians: Normal flow hemodynamics were seen in bilateral subclavian              arteries. *See table(s) above for measurements and observations.  Electronically signed by Tobias Alexander MD on 10/27/2017 at 2:54:00 PM.    Final     Assessment & Plan:   There are no diagnoses linked to this encounter.   No orders of the defined types were placed in this encounter.    Follow-up: No follow-ups on file.  Sonda Primes, MD

## 2019-09-04 ENCOUNTER — Encounter: Payer: Self-pay | Admitting: Internal Medicine

## 2019-10-12 DIAGNOSIS — H401131 Primary open-angle glaucoma, bilateral, mild stage: Secondary | ICD-10-CM | POA: Diagnosis not present

## 2019-10-17 ENCOUNTER — Ambulatory Visit (INDEPENDENT_AMBULATORY_CARE_PROVIDER_SITE_OTHER): Payer: Medicare Other | Admitting: Internal Medicine

## 2019-10-17 ENCOUNTER — Encounter: Payer: Self-pay | Admitting: Internal Medicine

## 2019-10-17 ENCOUNTER — Other Ambulatory Visit: Payer: Self-pay

## 2019-10-17 VITALS — BP 190/86 | HR 70 | Temp 98.1°F | Resp 16 | Ht 60.0 in | Wt 168.0 lb

## 2019-10-17 DIAGNOSIS — I10 Essential (primary) hypertension: Secondary | ICD-10-CM

## 2019-10-17 MED ORDER — INDAPAMIDE 1.25 MG PO TABS: 1.2500 mg | ORAL_TABLET | Freq: Every day | ORAL | 0 refills | Status: DC

## 2019-10-17 NOTE — Patient Instructions (Signed)

## 2019-10-17 NOTE — Progress Notes (Signed)
Subjective:  Patient ID: [name], female    DOB: 1933-09-19  Age: 86 y.o. MRN: [id number]  CC: Hypertension  This visit occurred during the SARS-CoV-2 public health emergency.  Safety protocols were in place, including screening questions prior to the visit, additional usage of staff PPE, and extensive cleaning of exam room while observing appropriate contact time as indicated for disinfecting solutions.   HPI EVVIE MESIC presents for f/up -she complains of her blood pressure is not well controlled and she has had a few left-sided headaches recently.  She tells me she is compliant with all of her antihypertensives but according to prescription refills she would have recently run out of furosemide.  Outpatient Medications Prior to Visit  Medication Sig Dispense Refill  . acetaminophen (TYLENOL) 325 MG tablet Take 650 mg by mouth every 6 (six) hours as needed.    Marland Kitchen amLODipine (NORVASC) 5 MG tablet TAKE 1 TABLET(5 MG) BY MOUTH DAILY 90 tablet 5  . ATROVENT HFA 17 MCG/ACT inhaler INL 2 PFS PO QID  5  . BREO ELLIPTA 200-25 MCG/INH AEPB INL 1 PUFF PO QD  5  . BYSTOLIC 20 MG TABS TAKE 1 TABLET BY MOUTH EVERY DAY 90 tablet 3  . diclofenac sodium (VOLTAREN) 1 % GEL apply 4 grams to affected area four times a day 200 g 4  . ezetimibe (ZETIA) 10 MG tablet TAKE 1 TABLET BY MOUTH EVERY DAY 30 tablet 11  . fexofenadine (ALLEGRA) 180 MG tablet Take 180 mg by mouth daily.      Marland Kitchen FLOVENT HFA 110 MCG/ACT inhaler Inhale 2 puffs into the lungs 2 (two) times daily.  0  . ipratropium (ATROVENT) 0.06 % nasal spray Place 2 sprays into both nostrils as needed for rhinitis.    Marland Kitchen latanoprost (XALATAN) 0.005 % ophthalmic solution INT 1 GTT IN OU D  3  . losartan (COZAAR) 100 MG tablet TAKE 1 TABLET BY MOUTH EVERY DAY 90 tablet 3  . Multiple Vitamins-Minerals (MULTIVITAMIN WITH MINERALS) tablet Take 1 tablet by mouth daily.    Marland Kitchen omeprazole (PRILOSEC) 40 MG capsule TAKE 1 CAPSULE BY MOUTH ONCE DAILY 30  capsule 11  . prednisoLONE acetate (PRED FORTE) 1 % ophthalmic suspension SHAKE LQ AND INT 1 GTT IN OU QID  0  . sucralfate (CARAFATE) 1 g tablet Take 1 tablet (1 g total) by mouth 4 (four) times daily -  with meals and at bedtime. 120 tablet 11  . furosemide (LASIX) 40 MG tablet TAKE 1 TABLET BY MOUTH EVERY DAY 90 tablet 1  . dorzolamide-timolol (COSOPT) 22.3-6.8 MG/ML ophthalmic solution Place 1 drop into both eyes 3 (three) times daily.      . pilocarpine (PILOCAR) 4 % ophthalmic solution Place 1 drop into the right eye at bedtime. Reported on 09/05/2015     No facility-administered medications prior to visit.    ROS Review of Systems  Constitutional: Negative.  Negative for appetite change, diaphoresis, fatigue and unexpected weight change.  Eyes: Negative for pain and visual disturbance.  Respiratory: Negative for cough, chest tightness, shortness of breath and wheezing.   Cardiovascular: Negative for chest pain, palpitations and leg swelling.  Gastrointestinal: Negative for abdominal pain, constipation, diarrhea, nausea and vomiting.  Endocrine: Negative.   Genitourinary: Negative.  Negative for difficulty urinating, frequency and hematuria.  Musculoskeletal: Negative.  Negative for back pain and myalgias.  Skin: Negative.   Neurological: Positive for headaches. Negative for dizziness, seizures, weakness and light-headedness.  Hematological: Negative for  adenopathy. Does not bruise/bleed easily.  Psychiatric/Behavioral: Negative.     Objective:  BP (!) 190/86 (BP Location: Right Arm, Patient Position: Sitting, Cuff Size: Normal)   Pulse 70   Temp 98.1 F (36.7 C) (Oral)   Resp 16   Ht 5' (1.524 m)   Wt 168 lb (76.2 kg)   SpO2 99%   BMI 32.81 kg/m   BP Readings from Last 3 Encounters:  10/17/19 (!) 190/86  08/31/19 (!) 160/85  08/31/19 (!) 160/85    Wt Readings from Last 3 Encounters:  10/17/19 168 lb (76.2 kg)  08/31/19 168 lb (76.2 kg)  08/31/19 168 lb (76.2 kg)      Physical Exam Vitals reviewed.  Constitutional:      General: She is not in acute distress.    Appearance: Normal appearance. She is not ill-appearing, toxic-appearing or diaphoretic.  HENT:     Nose: Nose normal.     Mouth/Throat:     Mouth: Mucous membranes are moist.  Eyes:     Extraocular Movements: Extraocular movements intact.     Conjunctiva/sclera: Conjunctivae normal.     Pupils: Pupils are equal, round, and reactive to light.  Cardiovascular:     Rate and Rhythm: Normal rate and regular rhythm.     Heart sounds: No murmur heard.   Pulmonary:     Effort: Pulmonary effort is normal.     Breath sounds: No stridor. No wheezing, rhonchi or rales.  Abdominal:     General: Abdomen is flat. Bowel sounds are normal. There is no distension.     Palpations: Abdomen is soft. There is no hepatomegaly, splenomegaly or mass.     Tenderness: There is no abdominal tenderness.  Musculoskeletal:        General: Normal range of motion.     Cervical back: Neck supple.     Right lower leg: No edema.     Left lower leg: No edema.  Lymphadenopathy:     Cervical: No cervical adenopathy.  Skin:    General: Skin is warm and dry.     Coloration: Skin is not pale.  Neurological:     General: No focal deficit present.     Mental Status: She is alert and oriented to person, place, and time. Mental status is at baseline.  Psychiatric:        Mood and Affect: Mood normal.        Behavior: Behavior normal.        Thought Content: Thought content normal.        Judgment: Judgment normal.     Lab Results  Component Value Date   WBC 6.9 02/28/2019   HGB 12.3 02/28/2019   HCT 37.6 02/28/2019   PLT 291.0 02/28/2019   GLUCOSE 89 08/31/2019   CHOL 172 09/08/2018   TRIG 76.0 09/08/2018   HDL 54.00 09/08/2018   LDLCALC 103 (H) 09/08/2018   ALT 9 09/08/2018   AST 14 09/08/2018   NA 141 08/31/2019   K 4.1 08/31/2019   CL 105 08/31/2019   CREATININE 0.80 08/31/2019   BUN 11 08/31/2019    CO2 31 08/31/2019   TSH 1.85 09/08/2018    VAS US CAROTID  Result Date: 10/27/2017 Carotid Arterial Duplex Study Indications:       Carotid artery stenosis. Risk Factors:      Hypertension, no history of smoking. Other Factors:     Dyslipidemia. Comparison Study:  Previous carotid duplex examination performed in 07/2016  showed the left ICA with velocities of 70/15 cm/sec and the                    left ICA with velocities of 169/44 cm/sec. Performing Technologist: Levy Sjogren RVT  Examination Guidelines: A complete evaluation includes B-mode imaging, spectral Doppler, color Doppler, and power Doppler as needed of all accessible portions of each vessel. Bilateral testing is considered an integral part of a complete examination. Limited examinations for reoccurring indications may be performed as noted.  Right Carotid Findings: +----------+--------+--------+--------+------------+--------+           PSV cm/sEDV cm/sStenosisDescribe    Comments +----------+--------+--------+--------+------------+--------+ CCA Prox  75      15                                   +----------+--------+--------+--------+------------+--------+ CCA Distal67      11                                   +----------+--------+--------+--------+------------+--------+ ICA Prox  70      17      1-39%   heterogenous         +----------+--------+--------+--------+------------+--------+ ICA Mid   67      17                          tortuous +----------+--------+--------+--------+------------+--------+ ICA Distal117     31                          tortuous +----------+--------+--------+--------+------------+--------+ ECA       88      22                                   +----------+--------+--------+--------+------------+--------+ +----------+--------+-------+----------------+-------------------+           PSV cm/sEDV cmsDescribe        Arm Pressure (mmHG)  +----------+--------+-------+----------------+-------------------+ [id number]             Multiphasic, [id number]                 +----------+--------+-------+----------------+-------------------+ +---------+--------+--+--------+--+---------+ VertebralPSV cm/s62EDV cm/s15Antegrade +---------+--------+--+--------+--+---------+ High bifurcation and tortuous ICA  Left Carotid Findings: +----------+--------+--------+--------+------------+--------+           PSV cm/sEDV cm/sStenosisDescribe    Comments +----------+--------+--------+--------+------------+--------+ CCA Prox  86      17                                   +----------+--------+--------+--------+------------+--------+ CCA Distal70      17                                   +----------+--------+--------+--------+------------+--------+ ICA Prox  55      13              heterogenous         +----------+--------+--------+--------+------------+--------+ ICA Mid   60      20      1-39%               tortuous +----------+--------+--------+--------+------------+--------+ ICA Distal72  22                          tortuous +----------+--------+--------+--------+------------+--------+ ECA       76      10                                   +----------+--------+--------+--------+------------+--------+ +----------+--------+--------+----------------+-------------------+ SubclavianPSV cm/sEDV cm/sDescribe        Arm Pressure (mmHG) +----------+--------+--------+----------------+-------------------+           63              Multiphasic, [id number]                 +----------+--------+--------+----------------+-------------------+ +---------+--------+--+--------+--+---------+ VertebralPSV cm/s45EDV cm/s11Antegrade +---------+--------+--+--------+--+---------+ High bifurcation and tortuous ICA  Final Interpretation: Right Carotid: Velocities in the right ICA are consistent with a 1-39% stenosis. Left  Carotid: Velocities in the left ICA are consistent with a 1-39% stenosis. Vertebrals:  Bilateral vertebral arteries demonstrate antegrade flow. Subclavians: Normal flow hemodynamics were seen in bilateral subclavian              arteries. *See table(s) above for measurements and observations.  Electronically signed by Tobias Alexander MD on 10/27/2017 at 2:54:00 PM.    Final     Assessment & Plan:   Jaylynn was seen today for hypertension.  Diagnoses and all orders for this visit:  Essential hypertension- Her blood pressure is not adequately well controlled and she is symptomatic with a headache.  It looks like she is not compliant with furosemide.  I think a thiazide diuretic would be a better option for blood pressure control.  I recommended that she start taking indapamide at 1.25 mg a day.  She will double the dose if she has not achieved a systolic blood pressure of 170 within the next week or 2.  I recommended she continue the other antihypertensives with no other changes. -     indapamide (LOZOL) 1.25 MG tablet; Take 1 tablet (1.25 mg total) by mouth daily.   I have discontinued Curt Bears. Boule's dorzolamide-timolol, pilocarpine, and furosemide. I am also having her start on indapamide. Additionally, I am having her maintain her fexofenadine, multivitamin with minerals, ipratropium, acetaminophen, Flovent HFA, Breo Ellipta, Atrovent HFA, latanoprost, prednisoLONE acetate, diclofenac sodium, omeprazole, Bystolic, sucralfate, amLODipine, losartan, and ezetimibe.  Meds ordered this encounter  Medications  . indapamide (LOZOL) 1.25 MG tablet    Sig: Take 1 tablet (1.25 mg total) by mouth daily.    Dispense:  90 tablet    Refill:  0     Follow-up: Return in about 4 weeks (around 11/14/2019).  Sanda Linger, MD

## 2019-10-23 ENCOUNTER — Telehealth: Payer: Self-pay

## 2019-10-23 NOTE — Telephone Encounter (Signed)
Noted. Thx.

## 2019-10-23 NOTE — Telephone Encounter (Signed)
Per [facility] note, on  10/20/2019 5:05:10 PM -She was seen on Tuesday due to high blood pressure issues. One of her medication was changed. Now she has blood pressure of 112/64. She is not having any symptoms. When she was at the office it was 190/?Marland Kitchen   Advised to see PCP within 3 days.

## 2019-10-26 ENCOUNTER — Encounter: Payer: Self-pay | Admitting: Internal Medicine

## 2019-10-26 ENCOUNTER — Ambulatory Visit (INDEPENDENT_AMBULATORY_CARE_PROVIDER_SITE_OTHER): Payer: Medicare Other | Admitting: Internal Medicine

## 2019-10-26 ENCOUNTER — Other Ambulatory Visit: Payer: Self-pay

## 2019-10-26 VITALS — BP 148/84 | HR 64 | Temp 98.4°F | Ht 60.0 in | Wt 165.0 lb

## 2019-10-26 DIAGNOSIS — E538 Deficiency of other specified B group vitamins: Secondary | ICD-10-CM

## 2019-10-26 DIAGNOSIS — E559 Vitamin D deficiency, unspecified: Secondary | ICD-10-CM | POA: Diagnosis not present

## 2019-10-26 DIAGNOSIS — R5383 Other fatigue: Secondary | ICD-10-CM

## 2019-10-26 DIAGNOSIS — G4452 New daily persistent headache (NDPH): Secondary | ICD-10-CM

## 2019-10-26 DIAGNOSIS — I10 Essential (primary) hypertension: Secondary | ICD-10-CM | POA: Diagnosis not present

## 2019-10-26 MED ORDER — FUROSEMIDE 40 MG PO TABS
40.0000 mg | ORAL_TABLET | Freq: Every day | ORAL | 3 refills | Status: DC
Start: 2019-10-26 — End: 2020-12-02

## 2019-10-26 NOTE — Progress Notes (Signed)
Subjective:   Patient ID: [name], female    DOB: 1934-01-25, 86 y.o.   MRN: [id number]  HPI The patient is an 86 YO female coming in for concerns about BP issues. She was started on indapamide by another provider and took it 1-2 times and it made her feel like she was dying. She did not take it since then. She is taking her normal BP medications currently which are lasix, amlodipine, and losartan. She denies missing doses. Checking BP at home and her meter was not accurate today when compared with our readings at the office. She does have chronic head pain in the back left side. She is concerned about this as it does not correlate with her BP readings being normal or high. Denies vision changes or hearing changes.   Review of Systems  Constitutional: Negative.   HENT: Negative.   Eyes: Negative.   Respiratory: Negative for cough, chest tightness and shortness of breath.   Cardiovascular: Negative for chest pain, palpitations and leg swelling.  Gastrointestinal: Negative for abdominal distention, abdominal pain, constipation, diarrhea, nausea and vomiting.  Musculoskeletal: Negative.   Skin: Negative.   Neurological: Positive for headaches.  Psychiatric/Behavioral: Negative.     Objective:  Physical Exam Constitutional:      Appearance: She is well-developed.  HENT:     Head: Normocephalic and atraumatic.  Cardiovascular:     Rate and Rhythm: Normal rate and regular rhythm.  Pulmonary:     Effort: Pulmonary effort is normal. No respiratory distress.     Breath sounds: Normal breath sounds. No wheezing or rales.  Abdominal:     General: Bowel sounds are normal. There is no distension.     Palpations: Abdomen is soft.     Tenderness: There is no abdominal tenderness. There is no rebound.  Musculoskeletal:        General: Tenderness present.     Cervical back: Normal range of motion.     Comments: Some tenderness left posterior skull  Skin:    General: Skin is warm and  dry.  Neurological:     Mental Status: She is alert and oriented to person, place, and time.     Coordination: Coordination normal.     Vitals:   10/26/19 1530  BP: (!) 148/84  Pulse: 64  Temp: 98.4 F (36.9 C)  TempSrc: Oral  SpO2: 96%  Weight: 165 lb (74.8 kg)  Height: 5' (1.524 m)    This visit occurred during the SARS-CoV-2 public health emergency.  Safety protocols were in place, including screening questions prior to the visit, additional usage of staff PPE, and extensive cleaning of exam room while observing appropriate contact time as indicated for disinfecting solutions.   Assessment & Plan:

## 2019-10-26 NOTE — Patient Instructions (Signed)
We will check the labs and the MRI of the brain to find out what is going on.  If all that is normal we will increase the dose of the amlodipine to 10 mg daily.

## 2019-10-27 ENCOUNTER — Other Ambulatory Visit: Payer: Self-pay | Admitting: Internal Medicine

## 2019-10-27 DIAGNOSIS — G4452 New daily persistent headache (NDPH): Secondary | ICD-10-CM | POA: Insufficient documentation

## 2019-10-27 LAB — COMPREHENSIVE METABOLIC PANEL
AG Ratio: 1.4 (calc) (ref 1.0–2.5)
ALT: 8 U/L (ref 6–29)
AST: 15 U/L (ref 10–35)
Albumin: 4 g/dL (ref 3.6–5.1)
Alkaline phosphatase (APISO): 84 U/L (ref 37–153)
BUN/Creatinine Ratio: 17 (calc) (ref 6–22)
BUN: 22 mg/dL (ref 7–25)
CO2: 30 mmol/L (ref 20–32)
Calcium: 9.6 mg/dL (ref 8.6–10.4)
Chloride: 100 mmol/L (ref 98–110)
Creat: 1.32 mg/dL — ABNORMAL HIGH (ref 0.60–0.88)
Globulin: 2.9 g/dL (calc) (ref 1.9–3.7)
Glucose, Bld: 107 mg/dL — ABNORMAL HIGH (ref 65–99)
Potassium: 4.3 mmol/L (ref 3.5–5.3)
Sodium: 137 mmol/L (ref 135–146)
Total Bilirubin: 0.5 mg/dL (ref 0.2–1.2)
Total Protein: 6.9 g/dL (ref 6.1–8.1)

## 2019-10-27 LAB — CBC
HCT: 37.5 % (ref 35.0–45.0)
Hemoglobin: 12.2 g/dL (ref 11.7–15.5)
MCH: 27.4 pg (ref 27.0–33.0)
MCHC: 32.5 g/dL (ref 32.0–36.0)
MCV: 84.3 fL (ref 80.0–100.0)
MPV: 10.9 fL (ref 7.5–12.5)
Platelets: 294 10*3/uL (ref 140–400)
RBC: 4.45 10*6/uL (ref 3.80–5.10)
RDW: 14 % (ref 11.0–15.0)
WBC: 8.7 10*3/uL (ref 3.8–10.8)

## 2019-10-27 LAB — VITAMIN B12: Vitamin B-12: 388 pg/mL (ref 200–1100)

## 2019-10-27 LAB — TSH: TSH: 1.71 mIU/L (ref 0.40–4.50)

## 2019-10-27 LAB — VITAMIN D 25 HYDROXY (VIT D DEFICIENCY, FRACTURES): Vit D, 25-Hydroxy: 19 ng/mL — ABNORMAL LOW (ref 30–100)

## 2019-10-27 MED ORDER — VITAMIN D (ERGOCALCIFEROL) 1.25 MG (50000 UNIT) PO CAPS: 50000.0000 [IU] | ORAL_CAPSULE | ORAL | 0 refills | Status: DC

## 2019-10-27 NOTE — Assessment & Plan Note (Signed)
Refill lasix 40 mg daily and continue amlodipine 5 mg daily and losartan 100 mg daily. Advised if BP still elevated would recommend increase to 10 mg daily amlodipine rather than adding a new medication. Stopped indapamide as she had poor reaction with that. Follow up PCP in 3-4 weeks for BP check.

## 2019-10-27 NOTE — Assessment & Plan Note (Signed)
Checking MRI brain due to new headaches. Checking labs as well to rule out alternate causes. No temporal tenderness bilateral.

## 2019-11-02 ENCOUNTER — Ambulatory Visit: Payer: Medicare Other | Admitting: Internal Medicine

## 2019-11-02 DIAGNOSIS — J301 Allergic rhinitis due to pollen: Secondary | ICD-10-CM | POA: Diagnosis not present

## 2019-11-02 DIAGNOSIS — J3089 Other allergic rhinitis: Secondary | ICD-10-CM | POA: Diagnosis not present

## 2019-11-02 DIAGNOSIS — J4530 Mild persistent asthma, uncomplicated: Secondary | ICD-10-CM | POA: Diagnosis not present

## 2019-11-07 ENCOUNTER — Other Ambulatory Visit: Payer: Self-pay

## 2019-11-07 ENCOUNTER — Ambulatory Visit (INDEPENDENT_AMBULATORY_CARE_PROVIDER_SITE_OTHER): Payer: Medicare Other | Admitting: Internal Medicine

## 2019-11-07 ENCOUNTER — Encounter: Payer: Self-pay | Admitting: Internal Medicine

## 2019-11-07 ENCOUNTER — Other Ambulatory Visit: Payer: Self-pay | Admitting: Internal Medicine

## 2019-11-07 DIAGNOSIS — I10 Essential (primary) hypertension: Secondary | ICD-10-CM

## 2019-11-07 DIAGNOSIS — E669 Obesity, unspecified: Secondary | ICD-10-CM | POA: Diagnosis not present

## 2019-11-07 DIAGNOSIS — E559 Vitamin D deficiency, unspecified: Secondary | ICD-10-CM | POA: Diagnosis not present

## 2019-11-07 DIAGNOSIS — E785 Hyperlipidemia, unspecified: Secondary | ICD-10-CM | POA: Diagnosis not present

## 2019-11-07 MED ORDER — CLONIDINE HCL 0.1 MG PO TABS: 0.1000 mg | ORAL_TABLET | Freq: Two times a day (BID) | ORAL | 3 refills | Status: DC | PRN

## 2019-11-07 NOTE — Assessment & Plan Note (Signed)
Vit D - see Rx

## 2019-11-07 NOTE — Assessment & Plan Note (Signed)
Wt Readings from Last 3 Encounters:  11/07/19 168 lb (76.2 kg)  10/26/19 165 lb (74.8 kg)  10/17/19 168 lb (76.2 kg)

## 2019-11-07 NOTE — Progress Notes (Signed)
Subjective:  Patient ID: [name], female    DOB: 1933-06-04  Age: 86 y.o. MRN: [id number]  CC: Hypertension (2 WEEK FOLLOW-UP)   HPI KARYME CALAFIORE presents for HTN - not well controlled  SBP at home 130-150  F/u elev creatinine, vit D def    Outpatient Medications Prior to Visit  Medication Sig Dispense Refill  . acetaminophen (TYLENOL) 325 MG tablet Take 650 mg by mouth every 6 (six) hours as needed.    Marland Kitchen amLODipine (NORVASC) 5 MG tablet TAKE 1 TABLET(5 MG) BY MOUTH DAILY 90 tablet 5  . ATROVENT HFA 17 MCG/ACT inhaler INL 2 PFS PO QID  5  . BREO ELLIPTA 200-25 MCG/INH AEPB INL 1 PUFF PO QD  5  . BYSTOLIC 20 MG TABS TAKE 1 TABLET BY MOUTH EVERY DAY 90 tablet 3  . diclofenac sodium (VOLTAREN) 1 % GEL apply 4 grams to affected area four times a day 200 g 4  . ezetimibe (ZETIA) 10 MG tablet TAKE 1 TABLET BY MOUTH EVERY DAY 30 tablet 11  . fexofenadine (ALLEGRA) 180 MG tablet Take 180 mg by mouth daily.      Marland Kitchen FLOVENT HFA 110 MCG/ACT inhaler Inhale 2 puffs into the lungs 2 (two) times daily.  0  . furosemide (LASIX) 40 MG tablet Take 1 tablet (40 mg total) by mouth daily. 90 tablet 3  . ipratropium (ATROVENT) 0.06 % nasal spray Place 2 sprays into both nostrils as needed for rhinitis.    Marland Kitchen latanoprost (XALATAN) 0.005 % ophthalmic solution INT 1 GTT IN OU D  3  . losartan (COZAAR) 100 MG tablet TAKE 1 TABLET BY MOUTH EVERY DAY 90 tablet 3  . Multiple Vitamins-Minerals (MULTIVITAMIN WITH MINERALS) tablet Take 1 tablet by mouth daily.    Marland Kitchen omeprazole (PRILOSEC) 40 MG capsule TAKE 1 CAPSULE BY MOUTH ONCE DAILY 30 capsule 11  . prednisoLONE acetate (PRED FORTE) 1 % ophthalmic suspension SHAKE LQ AND INT 1 GTT IN OU QID  0  . sucralfate (CARAFATE) 1 g tablet Take 1 tablet (1 g total) by mouth 4 (four) times daily -  with meals and at bedtime. 120 tablet 11  . Vitamin D, Ergocalciferol, (DRISDOL) 1.25 MG (50000 UNIT) CAPS capsule Take 1 capsule (50,000 Units total) by mouth every  7 (seven) days. 12 capsule 0   No facility-administered medications prior to visit.    ROS: Review of Systems  Constitutional: Negative for activity change, appetite change, chills, fatigue and unexpected weight change.  HENT: Negative for congestion, mouth sores and sinus pressure.   Eyes: Negative for visual disturbance.  Respiratory: Negative for cough and chest tightness.   Gastrointestinal: Negative for abdominal pain and nausea.  Genitourinary: Negative for difficulty urinating, frequency and vaginal pain.  Musculoskeletal: Positive for arthralgias. Negative for back pain and gait problem.  Skin: Negative for pallor and rash.  Neurological: Negative for dizziness, tremors, weakness, numbness and headaches.  Psychiatric/Behavioral: Negative for confusion and sleep disturbance.    Objective:  BP (!) 162/80 (BP Location: Left Arm)   Pulse (!) 58   Temp 98.4 F (36.9 C) (Oral)   Wt 168 lb (76.2 kg)   SpO2 97%   BMI 32.81 kg/m   BP Readings from Last 3 Encounters:  11/07/19 (!) 162/80  10/26/19 (!) 148/84  10/17/19 (!) 190/86    Wt Readings from Last 3 Encounters:  11/07/19 168 lb (76.2 kg)  10/26/19 165 lb (74.8 kg)  10/17/19 168 lb (76.2 kg)  Physical Exam Constitutional:      General: She is not in acute distress.    Appearance: She is well-developed. She is obese.  HENT:     Head: Normocephalic.     Right Ear: External ear normal.     Left Ear: External ear normal.     Nose: Nose normal.  Eyes:     General:        Right eye: No discharge.        Left eye: No discharge.     Conjunctiva/sclera: Conjunctivae normal.     Pupils: Pupils are equal, round, and reactive to light.  Neck:     Thyroid: No thyromegaly.     Vascular: No JVD.     Trachea: No tracheal deviation.  Cardiovascular:     Rate and Rhythm: Normal rate and regular rhythm.     Heart sounds: Normal heart sounds.  Pulmonary:     Effort: No respiratory distress.     Breath sounds: No  stridor. No wheezing.  Abdominal:     General: Bowel sounds are normal. There is no distension.     Palpations: Abdomen is soft. There is no mass.     Tenderness: There is no abdominal tenderness. There is no guarding or rebound.  Musculoskeletal:        General: No tenderness.     Cervical back: Normal range of motion and neck supple.  Lymphadenopathy:     Cervical: No cervical adenopathy.  Skin:    Findings: No erythema or rash.  Neurological:     Mental Status: She is oriented to person, place, and time.     Cranial Nerves: No cranial nerve deficit.     Motor: No abnormal muscle tone.     Coordination: Coordination normal.     Deep Tendon Reflexes: Reflexes normal.  Psychiatric:        Behavior: Behavior normal.        Thought Content: Thought content normal.        Judgment: Judgment normal.     Lab Results  Component Value Date   WBC 8.7 10/26/2019   HGB 12.2 10/26/2019   HCT 37.5 10/26/2019   PLT 294 10/26/2019   GLUCOSE 107 (H) 10/26/2019   CHOL 172 09/08/2018   TRIG 76.0 09/08/2018   HDL 54.00 09/08/2018   LDLCALC 103 (H) 09/08/2018   ALT 8 10/26/2019   AST 15 10/26/2019   NA 137 10/26/2019   K 4.3 10/26/2019   CL 100 10/26/2019   CREATININE 1.32 (H) 10/26/2019   BUN 22 10/26/2019   CO2 30 10/26/2019   TSH 1.71 10/26/2019    VAS US CAROTID  Result Date: 10/27/2017 Carotid Arterial Duplex Study Indications:       Carotid artery stenosis. Risk Factors:      Hypertension, no history of smoking. Other Factors:     Dyslipidemia. Comparison Study:  Previous carotid duplex examination performed in 07/2016                    showed the left ICA with velocities of 70/15 cm/sec and the                    left ICA with velocities of 169/44 cm/sec. Performing Technologist: Levy Sjogren RVT  Examination Guidelines: A complete evaluation includes B-mode imaging, spectral Doppler, color Doppler, and power Doppler as needed of all accessible portions of each vessel. Bilateral  testing is considered an integral part of a  complete examination. Limited examinations for reoccurring indications may be performed as noted.  Right Carotid Findings: +----------+--------+--------+--------+------------+--------+           PSV cm/sEDV cm/sStenosisDescribe    Comments +----------+--------+--------+--------+------------+--------+ CCA Prox  75      15                                   +----------+--------+--------+--------+------------+--------+ CCA Distal67      11                                   +----------+--------+--------+--------+------------+--------+ ICA Prox  70      17      1-39%   heterogenous         +----------+--------+--------+--------+------------+--------+ ICA Mid   67      17                          tortuous +----------+--------+--------+--------+------------+--------+ ICA Distal117     31                          tortuous +----------+--------+--------+--------+------------+--------+ ECA       88      22                                   +----------+--------+--------+--------+------------+--------+ +----------+--------+-------+----------------+-------------------+           PSV cm/sEDV cmsDescribe        Arm Pressure (mmHG) +----------+--------+-------+----------------+-------------------+ [id number]             Multiphasic, KVQ259                 +----------+--------+-------+----------------+-------------------+ +---------+--------+--+--------+--+---------+ VertebralPSV cm/s62EDV cm/s15Antegrade +---------+--------+--+--------+--+---------+ High bifurcation and tortuous ICA  Left Carotid Findings: +----------+--------+--------+--------+------------+--------+           PSV cm/sEDV cm/sStenosisDescribe    Comments +----------+--------+--------+--------+------------+--------+ CCA Prox  86      17                                   +----------+--------+--------+--------+------------+--------+ CCA  Distal70      17                                   +----------+--------+--------+--------+------------+--------+ ICA Prox  55      13              heterogenous         +----------+--------+--------+--------+------------+--------+ ICA Mid   60      20      1-39%               tortuous +----------+--------+--------+--------+------------+--------+ ICA Distal72      22                          tortuous +----------+--------+--------+--------+------------+--------+ ECA       76      10                                   +----------+--------+--------+--------+------------+--------+ +----------+--------+--------+----------------+-------------------+  SubclavianPSV cm/sEDV cm/sDescribe        Arm Pressure (mmHG) +----------+--------+--------+----------------+-------------------+           63              Multiphasic, WNL150                 +----------+--------+--------+----------------+-------------------+ +---------+--------+--+--------+--+---------+ VertebralPSV cm/s45EDV cm/s11Antegrade +---------+--------+--+--------+--+---------+ High bifurcation and tortuous ICA  Final Interpretation: Right Carotid: Velocities in the right ICA are consistent with a 1-39% stenosis. Left Carotid: Velocities in the left ICA are consistent with a 1-39% stenosis. Vertebrals:  Bilateral vertebral arteries demonstrate antegrade flow. Subclavians: Normal flow hemodynamics were seen in bilateral subclavian              arteries. *See table(s) above for measurements and observations.  Electronically signed by Tobias Alexander MD on 10/27/2017 at 2:54:00 PM.    Final     Assessment & Plan:   There are no diagnoses linked to this encounter.   No orders of the defined types were placed in this encounter.    Follow-up: No follow-ups on file.  Sonda Primes, MD

## 2019-11-07 NOTE — Assessment & Plan Note (Signed)
Zetia

## 2019-11-07 NOTE — Assessment & Plan Note (Addendum)
Clonidine prn for SBP>180 Exercise

## 2019-11-08 DIAGNOSIS — R319 Hematuria, unspecified: Secondary | ICD-10-CM | POA: Diagnosis not present

## 2019-11-08 DIAGNOSIS — N951 Menopausal and female climacteric states: Secondary | ICD-10-CM | POA: Diagnosis not present

## 2019-11-08 DIAGNOSIS — N898 Other specified noninflammatory disorders of vagina: Secondary | ICD-10-CM | POA: Diagnosis not present

## 2019-11-08 DIAGNOSIS — N8189 Other female genital prolapse: Secondary | ICD-10-CM | POA: Diagnosis not present

## 2019-11-14 DIAGNOSIS — F439 Reaction to severe stress, unspecified: Secondary | ICD-10-CM | POA: Diagnosis not present

## 2019-11-14 DIAGNOSIS — R42 Dizziness and giddiness: Secondary | ICD-10-CM | POA: Diagnosis not present

## 2019-11-15 ENCOUNTER — Ambulatory Visit: Payer: Medicare Other | Admitting: Internal Medicine

## 2019-11-16 ENCOUNTER — Ambulatory Visit
Admission: RE | Admit: 2019-11-16 | Discharge: 2019-11-16 | Disposition: A | Discharge status: Home / Self Care | Payer: Medicare Other | Source: Ambulatory Visit | Attending: Internal Medicine | Admitting: Internal Medicine

## 2019-11-16 ENCOUNTER — Other Ambulatory Visit: Payer: Self-pay

## 2019-11-16 DIAGNOSIS — G4452 New daily persistent headache (NDPH): Secondary | ICD-10-CM

## 2019-11-16 DIAGNOSIS — R519 Headache, unspecified: Secondary | ICD-10-CM | POA: Diagnosis not present

## 2019-11-16 DIAGNOSIS — R9082 White matter disease, unspecified: Secondary | ICD-10-CM | POA: Diagnosis not present

## 2019-11-18 ENCOUNTER — Other Ambulatory Visit: Payer: Medicare Other

## 2019-12-08 DIAGNOSIS — H524 Presbyopia: Secondary | ICD-10-CM | POA: Diagnosis not present

## 2019-12-18 ENCOUNTER — Ambulatory Visit (INDEPENDENT_AMBULATORY_CARE_PROVIDER_SITE_OTHER): Payer: Medicare Other | Admitting: Internal Medicine

## 2019-12-18 ENCOUNTER — Encounter: Payer: Self-pay | Admitting: Internal Medicine

## 2019-12-18 ENCOUNTER — Other Ambulatory Visit: Payer: Self-pay

## 2019-12-18 VITALS — BP 158/60 | HR 60 | Temp 97.8°F | Ht 60.0 in | Wt 165.0 lb

## 2019-12-18 DIAGNOSIS — E785 Hyperlipidemia, unspecified: Secondary | ICD-10-CM | POA: Diagnosis not present

## 2019-12-18 DIAGNOSIS — Z23 Encounter for immunization: Secondary | ICD-10-CM

## 2019-12-18 DIAGNOSIS — E559 Vitamin D deficiency, unspecified: Secondary | ICD-10-CM | POA: Diagnosis not present

## 2019-12-18 DIAGNOSIS — F419 Anxiety disorder, unspecified: Secondary | ICD-10-CM | POA: Diagnosis not present

## 2019-12-18 DIAGNOSIS — I10 Essential (primary) hypertension: Secondary | ICD-10-CM

## 2019-12-18 DIAGNOSIS — G44209 Tension-type headache, unspecified, not intractable: Secondary | ICD-10-CM

## 2019-12-18 DIAGNOSIS — R519 Headache, unspecified: Secondary | ICD-10-CM | POA: Insufficient documentation

## 2019-12-18 NOTE — Assessment & Plan Note (Signed)
Dtr w/MS in [place]

## 2019-12-18 NOTE — Assessment & Plan Note (Signed)
On Zetia 

## 2019-12-18 NOTE — Assessment & Plan Note (Signed)
Vit D  po 

## 2019-12-18 NOTE — Assessment & Plan Note (Signed)
Losartan, Bystolic, Furosemide, Amlodipine 5 mg Check BP at home.

## 2019-12-18 NOTE — Addendum Note (Signed)
Addended by: Lauralee Evener C on: 12/18/2019 12:09 PM   Modules accepted: Orders

## 2019-12-18 NOTE — Assessment & Plan Note (Signed)
Better ?stress related Brain  MRI normal

## 2019-12-18 NOTE — Progress Notes (Signed)
Subjective:  Patient ID: [name], female    DOB: Mar 04, 1934  Age: 86 y.o. MRN: [id number]  CC: No chief complaint on file.   HPI [name] presents for asthma, HTN, dyslipidemia f/u BP nl at home F/u HA s - much better   Outpatient Medications Prior to Visit  Medication Sig Dispense Refill  . acetaminophen (TYLENOL) 325 MG tablet Take 650 mg by mouth every 6 (six) hours as needed.    Marland Kitchen amLODipine (NORVASC) 5 MG tablet TAKE 1 TABLET(5 MG) BY MOUTH DAILY 90 tablet 5  . ATROVENT HFA 17 MCG/ACT inhaler INL 2 PFS PO QID  5  . BREO ELLIPTA 200-25 MCG/INH AEPB INL 1 PUFF PO QD  5  . BYSTOLIC 20 MG TABS TAKE 1 TABLET BY MOUTH EVERY DAY 90 tablet 3  . cloNIDine (CATAPRES) 0.1 MG tablet Take 1 tablet (0.1 mg total) by mouth 2 (two) times daily as needed. 60 tablet 3  . diclofenac sodium (VOLTAREN) 1 % GEL apply 4 grams to affected area four times a day 200 g 4  . ezetimibe (ZETIA) 10 MG tablet TAKE 1 TABLET BY MOUTH EVERY DAY 30 tablet 11  . fexofenadine (ALLEGRA) 180 MG tablet Take 180 mg by mouth daily.      Marland Kitchen FLOVENT HFA 110 MCG/ACT inhaler Inhale 2 puffs into the lungs 2 (two) times daily.  0  . furosemide (LASIX) 40 MG tablet Take 1 tablet (40 mg total) by mouth daily. 90 tablet 3  . ipratropium (ATROVENT) 0.06 % nasal spray Place 2 sprays into both nostrils as needed for rhinitis.    Marland Kitchen latanoprost (XALATAN) 0.005 % ophthalmic solution INT 1 GTT IN OU D  3  . losartan (COZAAR) 100 MG tablet TAKE 1 TABLET BY MOUTH EVERY DAY 90 tablet 3  . Multiple Vitamins-Minerals (MULTIVITAMIN WITH MINERALS) tablet Take 1 tablet by mouth daily.    Marland Kitchen omeprazole (PRILOSEC) 40 MG capsule TAKE 1 CAPSULE BY MOUTH ONCE DAILY 30 capsule 11  . prednisoLONE acetate (PRED FORTE) 1 % ophthalmic suspension SHAKE LQ AND INT 1 GTT IN OU QID  0  . sucralfate (CARAFATE) 1 g tablet Take 1 tablet (1 g total) by mouth 4 (four) times daily -  with meals and at bedtime. 120 tablet 11  . Vitamin D,  Ergocalciferol, (DRISDOL) 1.25 MG (50000 UNIT) CAPS capsule Take 1 capsule (50,000 Units total) by mouth every 7 (seven) days. 12 capsule 0   No facility-administered medications prior to visit.    ROS: Review of Systems  Constitutional: Negative for activity change, appetite change, chills, fatigue and unexpected weight change.  HENT: Negative for congestion, mouth sores and sinus pressure.   Eyes: Negative for visual disturbance.  Respiratory: Negative for cough and chest tightness.   Gastrointestinal: Negative for abdominal pain and nausea.  Genitourinary: Negative for difficulty urinating, frequency and vaginal pain.  Musculoskeletal: Positive for arthralgias. Negative for back pain and gait problem.  Skin: Negative for pallor and rash.  Neurological: Negative for dizziness, tremors, weakness, numbness and headaches.  Psychiatric/Behavioral: Negative for confusion and sleep disturbance.    Objective:  BP (!) 158/60 (BP Location: Left Arm, Patient Position: Sitting, Cuff Size: Large)   Pulse 60   Temp 97.8 F (36.6 C) (Oral)   Ht 5' (1.524 m)   Wt 165 lb (74.8 kg)   SpO2 98%   BMI 32.22 kg/m   BP Readings from Last 3 Encounters:  12/18/19 (!) 158/60  11/07/19 Marland Kitchen)  162/80  10/26/19 (!) 148/84    Wt Readings from Last 3 Encounters:  12/18/19 165 lb (74.8 kg)  11/07/19 168 lb (76.2 kg)  10/26/19 165 lb (74.8 kg)    Physical Exam Constitutional:      General: She is not in acute distress.    Appearance: She is well-developed.  HENT:     Head: Normocephalic.     Right Ear: External ear normal.     Left Ear: External ear normal.     Nose: Nose normal.  Eyes:     General:        Right eye: No discharge.        Left eye: No discharge.     Conjunctiva/sclera: Conjunctivae normal.     Pupils: Pupils are equal, round, and reactive to light.  Neck:     Thyroid: No thyromegaly.     Vascular: No JVD.     Trachea: No tracheal deviation.  Cardiovascular:     Rate and  Rhythm: Normal rate and regular rhythm.     Heart sounds: Normal heart sounds.  Pulmonary:     Effort: No respiratory distress.     Breath sounds: No stridor. No wheezing.  Abdominal:     General: Bowel sounds are normal. There is no distension.     Palpations: Abdomen is soft. There is no mass.     Tenderness: There is no abdominal tenderness. There is no guarding or rebound.  Musculoskeletal:        General: No tenderness.     Cervical back: Normal range of motion and neck supple.  Lymphadenopathy:     Cervical: No cervical adenopathy.  Skin:    Findings: No erythema or rash.  Neurological:     Mental Status: She is oriented to person, place, and time.     Cranial Nerves: No cranial nerve deficit.     Motor: No abnormal muscle tone.     Coordination: Coordination normal.     Deep Tendon Reflexes: Reflexes normal.  Psychiatric:        Behavior: Behavior normal.        Thought Content: Thought content normal.        Judgment: Judgment normal.     Lab Results  Component Value Date   WBC 8.7 10/26/2019   HGB 12.2 10/26/2019   HCT 37.5 10/26/2019   PLT 294 10/26/2019   GLUCOSE 107 (H) 10/26/2019   CHOL 172 09/08/2018   TRIG 76.0 09/08/2018   HDL 54.00 09/08/2018   LDLCALC 103 (H) 09/08/2018   ALT 8 10/26/2019   AST 15 10/26/2019   NA 137 10/26/2019   K 4.3 10/26/2019   CL 100 10/26/2019   CREATININE 1.32 (H) 10/26/2019   BUN 22 10/26/2019   CO2 30 10/26/2019   TSH 1.71 10/26/2019    MR Brain Wo Contrast  Result Date: 11/17/2019 CLINICAL DATA:  Left temporal headache. EXAM: MRI HEAD WITHOUT CONTRAST TECHNIQUE: Multiplanar, multiecho pulse sequences of the brain and surrounding structures were obtained without intravenous contrast. COMPARISON:  None. FINDINGS: Brain: No acute infarct, acute hemorrhage or extra-axial collection. Multifocal hyperintense T2-weighted signal within the white matter. Normal volume of CSF spaces. No chronic microhemorrhage. Normal midline  structures. Vascular: Normal flow voids. Skull and upper cervical spine: Normal marrow signal. Sinuses/Orbits: Negative.  Left ocular lens replacement. Other: None IMPRESSION: 1. No acute intracranial abnormality. 2. White matter changes most commonly indicating chronic small vessel ischemia. Electronically Signed   By: Deatra Robinson  M.D.   On: 11/17/2019 02:35    Assessment & Plan:    Sonda Primes, MD

## 2019-12-28 DIAGNOSIS — N8189 Other female genital prolapse: Secondary | ICD-10-CM | POA: Diagnosis not present

## 2020-01-23 ENCOUNTER — Other Ambulatory Visit: Payer: Self-pay | Admitting: Internal Medicine

## 2020-01-23 DIAGNOSIS — I10 Essential (primary) hypertension: Secondary | ICD-10-CM

## 2020-02-13 ENCOUNTER — Other Ambulatory Visit: Payer: Self-pay

## 2020-02-13 ENCOUNTER — Encounter: Payer: Self-pay | Admitting: Internal Medicine

## 2020-02-13 ENCOUNTER — Ambulatory Visit (INDEPENDENT_AMBULATORY_CARE_PROVIDER_SITE_OTHER): Payer: Medicare Other | Admitting: Internal Medicine

## 2020-02-13 DIAGNOSIS — R198 Other specified symptoms and signs involving the digestive system and abdomen: Secondary | ICD-10-CM | POA: Diagnosis not present

## 2020-02-13 DIAGNOSIS — R140 Abdominal distension (gaseous): Secondary | ICD-10-CM | POA: Diagnosis not present

## 2020-02-13 DIAGNOSIS — Z9079 Acquired absence of other genital organ(s): Secondary | ICD-10-CM | POA: Diagnosis not present

## 2020-02-13 LAB — COMPREHENSIVE METABOLIC PANEL
ALT: 7 U/L (ref 0–35)
AST: 13 U/L (ref 0–37)
Albumin: 3.9 g/dL (ref 3.5–5.2)
Alkaline Phosphatase: 82 U/L (ref 39–117)
BUN: 10 mg/dL (ref 6–23)
CO2: 32 mEq/L (ref 19–32)
Calcium: 9.2 mg/dL (ref 8.4–10.5)
Chloride: 105 mEq/L (ref 96–112)
Creatinine, Ser: 1.1 mg/dL (ref 0.40–1.20)
GFR: 46.2 mL/min — ABNORMAL LOW (ref >60.00)
Glucose, Bld: 96 mg/dL (ref 70–99)
Potassium: 3.6 mEq/L (ref 3.5–5.1)
Sodium: 142 mEq/L (ref 135–145)
Total Bilirubin: 0.4 mg/dL (ref 0.2–1.2)
Total Protein: 7 g/dL (ref 6.0–8.3)

## 2020-02-13 LAB — CBC WITH DIFFERENTIAL/PLATELET
Basophils Absolute: 0 10*3/uL (ref 0.0–0.1)
Basophils Relative: 0.4 % (ref 0.0–3.0)
Eosinophils Absolute: 0.6 10*3/uL (ref 0.0–0.7)
Eosinophils Relative: 7.6 % — ABNORMAL HIGH (ref 0.0–5.0)
HCT: 37.3 % (ref 36.0–46.0)
Hemoglobin: 12.2 g/dL (ref 12.0–15.0)
Lymphocytes Relative: 37.7 % (ref 12.0–46.0)
Lymphs Abs: 2.8 10*3/uL (ref 0.7–4.0)
MCHC: 32.8 g/dL (ref 30.0–36.0)
MCV: 82.5 fl (ref 78.0–100.0)
Monocytes Absolute: 0.5 10*3/uL (ref 0.1–1.0)
Monocytes Relative: 6.9 % (ref 3.0–12.0)
Neutro Abs: 3.5 10*3/uL (ref 1.4–7.7)
Neutrophils Relative %: 47.4 % (ref 43.0–77.0)
Platelets: 271 10*3/uL (ref 150.0–400.0)
RBC: 4.52 Mil/uL (ref 3.87–5.11)
RDW: 14.8 % (ref 11.5–15.5)
WBC: 7.4 10*3/uL (ref 4.0–10.5)

## 2020-02-13 NOTE — Assessment & Plan Note (Signed)
Partial CT

## 2020-02-13 NOTE — Assessment & Plan Note (Signed)
R side of the abd is bigger then the left. She noticed it 2 wks ago. No pain. No n/v Hernia vs other Labs CT

## 2020-02-13 NOTE — Progress Notes (Signed)
Subjective:  Patient ID: [name], female    DOB: [birth year]  Age: 86 y.o. MRN: [id number]  CC: Acute Visit (c/o having a hard spot on RT mid-abdomen area  since last OV.  )   HPI [name] presents for R side of the abd is bigger then the left. She noticed it 2 wks ago. No pain. No n/v  Outpatient Medications Prior to Visit  Medication Sig Dispense Refill  . acetaminophen (TYLENOL) 325 MG tablet Take 650 mg by mouth every 6 (six) hours as needed.    Marland Kitchen amLODipine (NORVASC) 5 MG tablet TAKE 1 TABLET(5 MG) BY MOUTH DAILY 90 tablet 5  . ATROVENT HFA 17 MCG/ACT inhaler INL 2 PFS PO QID  5  . BREO ELLIPTA 200-25 MCG/INH AEPB INL 1 PUFF PO QD  5  . BYSTOLIC 20 MG TABS TAKE 1 TABLET BY MOUTH EVERY DAY 90 tablet 3  . cloNIDine (CATAPRES) 0.1 MG tablet Take 1 tablet (0.1 mg total) by mouth 2 (two) times daily as needed. 60 tablet 3  . diclofenac sodium (VOLTAREN) 1 % GEL apply 4 grams to affected area four times a day 200 g 4  . ezetimibe (ZETIA) 10 MG tablet TAKE 1 TABLET BY MOUTH EVERY DAY 30 tablet 11  . fexofenadine (ALLEGRA) 180 MG tablet Take 180 mg by mouth daily.      Marland Kitchen FLOVENT HFA 110 MCG/ACT inhaler Inhale 2 puffs into the lungs 2 (two) times daily.  0  . furosemide (LASIX) 40 MG tablet Take 1 tablet (40 mg total) by mouth daily. 90 tablet 3  . ipratropium (ATROVENT) 0.06 % nasal spray Place 2 sprays into both nostrils as needed for rhinitis.    Marland Kitchen latanoprost (XALATAN) 0.005 % ophthalmic solution INT 1 GTT IN OU D  3  . losartan (COZAAR) 100 MG tablet TAKE 1 TABLET BY MOUTH EVERY DAY 90 tablet 3  . Multiple Vitamins-Minerals (MULTIVITAMIN WITH MINERALS) tablet Take 1 tablet by mouth daily.    Marland Kitchen omeprazole (PRILOSEC) 40 MG capsule TAKE 1 CAPSULE BY MOUTH ONCE DAILY 30 capsule 11  . prednisoLONE acetate (PRED FORTE) 1 % ophthalmic suspension SHAKE LQ AND INT 1 GTT IN OU QID  0  . sucralfate (CARAFATE) 1 g tablet Take 1 tablet (1 g total) by mouth 4 (four) times daily -   with meals and at bedtime. 120 tablet 11  . Vitamin D, Ergocalciferol, (DRISDOL) 1.25 MG (50000 UNIT) CAPS capsule Take 1 capsule (50,000 Units total) by mouth every 7 (seven) days. 12 capsule 0   No facility-administered medications prior to visit.    ROS: Review of Systems  Constitutional: Negative for activity change, appetite change, chills, fatigue and unexpected weight change.  HENT: Negative for congestion, mouth sores and sinus pressure.   Eyes: Negative for visual disturbance.  Respiratory: Negative for cough and chest tightness.   Gastrointestinal: Positive for abdominal distention. Negative for abdominal pain, anal bleeding, diarrhea, nausea and vomiting.  Genitourinary: Negative for difficulty urinating, frequency and vaginal pain.  Musculoskeletal: Negative for back pain and gait problem.  Skin: Negative for pallor and rash.  Neurological: Negative for dizziness, tremors, weakness, numbness and headaches.  Psychiatric/Behavioral: Negative for confusion and sleep disturbance.    Objective:  BP 140/74   Pulse 72   Temp 98.2 F (36.8 C) (Oral)   Ht 5' (1.524 m)   Wt 162 lb 12.8 oz (73.8 kg)   SpO2 99%   BMI 31.79 kg/m  BP Readings from Last 3 Encounters:  02/13/20 140/74  12/18/19 (!) 158/60  11/07/19 (!) 162/80    Wt Readings from Last 3 Encounters:  02/13/20 162 lb 12.8 oz (73.8 kg)  12/18/19 165 lb (74.8 kg)  11/07/19 168 lb (76.2 kg)    Physical Exam Constitutional:      General: She is not in acute distress.    Appearance: She is well-developed. She is obese. She is not toxic-appearing.  HENT:     Head: Normocephalic.     Right Ear: External ear normal.     Left Ear: External ear normal.     Nose: Nose normal.  Eyes:     General:        Right eye: No discharge.        Left eye: No discharge.     Conjunctiva/sclera: Conjunctivae normal.     Pupils: Pupils are equal, round, and reactive to light.  Neck:     Thyroid: No thyromegaly.      Vascular: No JVD.     Trachea: No tracheal deviation.  Cardiovascular:     Rate and Rhythm: Normal rate and regular rhythm.     Heart sounds: Normal heart sounds.  Pulmonary:     Effort: No respiratory distress.     Breath sounds: No stridor. No wheezing.  Abdominal:     General: Bowel sounds are normal. There is distension.     Palpations: Abdomen is soft. There is no mass.     Tenderness: There is no abdominal tenderness. There is no guarding or rebound.  Musculoskeletal:        General: No tenderness.     Cervical back: Normal range of motion and neck supple.  Lymphadenopathy:     Cervical: No cervical adenopathy.  Skin:    Findings: No erythema or rash.  Neurological:     Cranial Nerves: No cranial nerve deficit.     Motor: No abnormal muscle tone.     Coordination: Coordination normal.     Deep Tendon Reflexes: Reflexes normal.  Psychiatric:        Behavior: Behavior normal.        Thought Content: Thought content normal.        Judgment: Judgment normal.    R abd>L abd; no mass  NT   Lab Results  Component Value Date   WBC 8.7 10/26/2019   HGB 12.2 10/26/2019   HCT 37.5 10/26/2019   PLT 294 10/26/2019   GLUCOSE 107 (H) 10/26/2019   CHOL 172 09/08/2018   TRIG 76.0 09/08/2018   HDL 54.00 09/08/2018   LDLCALC 103 (H) 09/08/2018   ALT 8 10/26/2019   AST 15 10/26/2019   NA 137 10/26/2019   K 4.3 10/26/2019   CL 100 10/26/2019   CREATININE 1.32 (H) 10/26/2019   BUN 22 10/26/2019   CO2 30 10/26/2019   TSH 1.71 10/26/2019    MR Brain Wo Contrast  Result Date: 11/17/2019 CLINICAL DATA:  Left temporal headache. EXAM: MRI HEAD WITHOUT CONTRAST TECHNIQUE: Multiplanar, multiecho pulse sequences of the brain and surrounding structures were obtained without intravenous contrast. COMPARISON:  None. FINDINGS: Brain: No acute infarct, acute hemorrhage or extra-axial collection. Multifocal hyperintense T2-weighted signal within the white matter. Normal volume of CSF  spaces. No chronic microhemorrhage. Normal midline structures. Vascular: Normal flow voids. Skull and upper cervical spine: Normal marrow signal. Sinuses/Orbits: Negative.  Left ocular lens replacement. Other: None IMPRESSION: 1. No acute intracranial abnormality. 2. White matter changes most  commonly indicating chronic small vessel ischemia. Electronically Signed   By: Deatra Robinson M.D.   On: 11/17/2019 02:35    Assessment & Plan:   There are no diagnoses linked to this encounter.   No orders of the defined types were placed in this encounter.    Follow-up: No follow-ups on file.  Sonda Primes, MD

## 2020-02-13 NOTE — Addendum Note (Signed)
Addended by: Jacob Moores on: 02/13/2020 01:51 PM   Modules accepted: Orders

## 2020-02-15 DIAGNOSIS — Z961 Presence of intraocular lens: Secondary | ICD-10-CM | POA: Diagnosis not present

## 2020-02-15 DIAGNOSIS — H401131 Primary open-angle glaucoma, bilateral, mild stage: Secondary | ICD-10-CM | POA: Diagnosis not present

## 2020-02-15 DIAGNOSIS — H2511 Age-related nuclear cataract, right eye: Secondary | ICD-10-CM | POA: Diagnosis not present

## 2020-02-15 DIAGNOSIS — H18421 Band keratopathy, right eye: Secondary | ICD-10-CM | POA: Diagnosis not present

## 2020-03-01 ENCOUNTER — Other Ambulatory Visit: Payer: Self-pay | Admitting: Obstetrics and Gynecology

## 2020-03-01 ENCOUNTER — Ambulatory Visit
Admission: RE | Admit: 2020-03-01 | Discharge: 2020-03-01 | Disposition: A | Discharge status: Home / Self Care | Payer: Medicare Other | Source: Ambulatory Visit | Attending: Internal Medicine | Admitting: Internal Medicine

## 2020-03-01 ENCOUNTER — Other Ambulatory Visit: Payer: Self-pay

## 2020-03-01 DIAGNOSIS — R140 Abdominal distension (gaseous): Secondary | ICD-10-CM | POA: Diagnosis not present

## 2020-03-01 DIAGNOSIS — R198 Other specified symptoms and signs involving the digestive system and abdomen: Secondary | ICD-10-CM

## 2020-03-01 DIAGNOSIS — K4090 Unilateral inguinal hernia, without obstruction or gangrene, not specified as recurrent: Secondary | ICD-10-CM | POA: Diagnosis not present

## 2020-03-01 DIAGNOSIS — K429 Umbilical hernia without obstruction or gangrene: Secondary | ICD-10-CM | POA: Diagnosis not present

## 2020-03-01 DIAGNOSIS — K5730 Diverticulosis of large intestine without perforation or abscess without bleeding: Secondary | ICD-10-CM | POA: Diagnosis not present

## 2020-03-01 MED ORDER — IOPAMIDOL (ISOVUE-300) INJECTION 61%
100.0000 mL | Freq: Once | INTRAVENOUS | Status: AC | PRN
  Administered 2020-03-01: 100 mL via INTRAVENOUS

## 2020-03-12 ENCOUNTER — Other Ambulatory Visit: Payer: Self-pay

## 2020-03-12 ENCOUNTER — Ambulatory Visit (INDEPENDENT_AMBULATORY_CARE_PROVIDER_SITE_OTHER): Payer: Medicare Other | Admitting: Internal Medicine

## 2020-03-12 ENCOUNTER — Encounter: Payer: Self-pay | Admitting: Internal Medicine

## 2020-03-12 DIAGNOSIS — R1909 Other intra-abdominal and pelvic swelling, mass and lump: Secondary | ICD-10-CM | POA: Diagnosis not present

## 2020-03-12 DIAGNOSIS — K469 Unspecified abdominal hernia without obstruction or gangrene: Secondary | ICD-10-CM | POA: Insufficient documentation

## 2020-03-12 NOTE — Assessment & Plan Note (Addendum)
On CT  - Moderate-sized right-sided infraumbilical hernia containing fat and fluid. It is symptomatic Surgery ref

## 2020-03-12 NOTE — Assessment & Plan Note (Signed)
Moderate-sized right-sided infraumbilical hernia containing fat and fluid, symptomatic Surgical ref

## 2020-03-12 NOTE — Progress Notes (Signed)
Subjective:  Patient ID: [name], female    DOB: [birth year]  Age: 86 y.o. MRN: [id number]  CC: Follow-up   HPI MIROLA CHASEY presents for abd hernia, CT review, HTN f/u  Outpatient Medications Prior to Visit  Medication Sig Dispense Refill  . acetaminophen (TYLENOL) 325 MG tablet Take 650 mg by mouth every 6 (six) hours as needed.    Marland Kitchen amLODipine (NORVASC) 5 MG tablet TAKE 1 TABLET(5 MG) BY MOUTH DAILY 90 tablet 5  . ATROVENT HFA 17 MCG/ACT inhaler INL 2 PFS PO QID  5  . BREO ELLIPTA 200-25 MCG/INH AEPB INL 1 PUFF PO QD  5  . BYSTOLIC 20 MG TABS TAKE 1 TABLET BY MOUTH EVERY DAY 90 tablet 3  . cloNIDine (CATAPRES) 0.1 MG tablet Take 1 tablet (0.1 mg total) by mouth 2 (two) times daily as needed. 60 tablet 3  . diclofenac sodium (VOLTAREN) 1 % GEL apply 4 grams to affected area four times a day 200 g 4  . ezetimibe (ZETIA) 10 MG tablet TAKE 1 TABLET BY MOUTH EVERY DAY 30 tablet 11  . fexofenadine (ALLEGRA) 180 MG tablet Take 180 mg by mouth daily.    Marland Kitchen FLOVENT HFA 110 MCG/ACT inhaler Inhale 2 puffs into the lungs 2 (two) times daily.  0  . furosemide (LASIX) 40 MG tablet Take 1 tablet (40 mg total) by mouth daily. 90 tablet 3  . ipratropium (ATROVENT) 0.06 % nasal spray Place 2 sprays into both nostrils as needed for rhinitis.    Marland Kitchen latanoprost (XALATAN) 0.005 % ophthalmic solution INT 1 GTT IN OU D  3  . losartan (COZAAR) 100 MG tablet TAKE 1 TABLET BY MOUTH EVERY DAY 90 tablet 3  . Multiple Vitamins-Minerals (MULTIVITAMIN WITH MINERALS) tablet Take 1 tablet by mouth daily.    Marland Kitchen omeprazole (PRILOSEC) 40 MG capsule TAKE 1 CAPSULE BY MOUTH ONCE DAILY 30 capsule 11  . prednisoLONE acetate (PRED FORTE) 1 % ophthalmic suspension SHAKE LQ AND INT 1 GTT IN OU QID  0  . sucralfate (CARAFATE) 1 g tablet Take 1 tablet (1 g total) by mouth 4 (four) times daily -  with meals and at bedtime. 120 tablet 11  . Vitamin D, Ergocalciferol, (DRISDOL) 1.25 MG (50000 UNIT) CAPS capsule Take 1  capsule (50,000 Units total) by mouth every 7 (seven) days. 12 capsule 0   No facility-administered medications prior to visit.    ROS: Review of Systems  Constitutional: Negative for activity change, appetite change, chills, fatigue and unexpected weight change.  HENT: Negative for congestion, mouth sores and sinus pressure.   Eyes: Negative for visual disturbance.  Respiratory: Negative for cough and chest tightness.   Gastrointestinal: Positive for abdominal distention and abdominal pain. Negative for nausea and vomiting.  Genitourinary: Negative for difficulty urinating, frequency and vaginal pain.  Musculoskeletal: Negative for back pain and gait problem.  Skin: Negative for pallor and rash.  Neurological: Negative for dizziness, tremors, weakness, numbness and headaches.  Psychiatric/Behavioral: Negative for confusion and sleep disturbance.    Objective:  BP 130/66   Pulse (!) 53   Temp 98.2 F (36.8 C) (Oral)   Ht 5' (1.524 m)   Wt 158 lb (71.7 kg)   SpO2 99%   BMI 30.86 kg/m   BP Readings from Last 3 Encounters:  03/12/20 130/66  02/13/20 140/74  12/18/19 (!) 158/60    Wt Readings from Last 3 Encounters:  03/12/20 158 lb (71.7 kg)  02/13/20 162 lb  12.8 oz (73.8 kg)  12/18/19 165 lb (74.8 kg)    Physical Exam Constitutional:      General: She is not in acute distress.    Appearance: She is well-developed.  HENT:     Head: Normocephalic.     Right Ear: External ear normal.     Left Ear: External ear normal.     Nose: Nose normal.     Mouth/Throat:     Mouth: Oropharynx is clear and moist.  Eyes:     General:        Right eye: No discharge.        Left eye: No discharge.     Conjunctiva/sclera: Conjunctivae normal.     Pupils: Pupils are equal, round, and reactive to light.  Neck:     Thyroid: No thyromegaly.     Vascular: No JVD.     Trachea: No tracheal deviation.  Cardiovascular:     Rate and Rhythm: Normal rate and regular rhythm.     Heart  sounds: Normal heart sounds.  Pulmonary:     Effort: No respiratory distress.     Breath sounds: No stridor. No wheezing.  Abdominal:     General: Bowel sounds are normal. There is distension.     Palpations: Abdomen is soft. There is no mass.     Tenderness: There is abdominal tenderness. There is no guarding or rebound.     Hernia: A hernia is present.  Musculoskeletal:        General: No tenderness or edema.     Cervical back: Normal range of motion and neck supple.  Lymphadenopathy:     Cervical: No cervical adenopathy.  Skin:    Findings: No erythema or rash.  Neurological:     Mental Status: She is oriented to person, place, and time.     Cranial Nerves: No cranial nerve deficit.     Motor: No abnormal muscle tone.     Coordination: Coordination normal.     Deep Tendon Reflexes: Reflexes normal.  Psychiatric:        Mood and Affect: Mood and affect normal.        Behavior: Behavior normal.        Thought Content: Thought content normal.        Judgment: Judgment normal.    Fullness in the R side of lower abd   Lab Results  Component Value Date   WBC 7.4 02/13/2020   HGB 12.2 02/13/2020   HCT 37.3 02/13/2020   PLT 271.0 02/13/2020   GLUCOSE 96 02/13/2020   CHOL 172 09/08/2018   TRIG 76.0 09/08/2018   HDL 54.00 09/08/2018   LDLCALC 103 (H) 09/08/2018   ALT 7 02/13/2020   AST 13 02/13/2020   NA 142 02/13/2020   K 3.6 02/13/2020   CL 105 02/13/2020   CREATININE 1.10 02/13/2020   BUN 10 02/13/2020   CO2 32 02/13/2020   TSH 1.71 10/26/2019    CT Abdomen Pelvis W Contrast  Result Date: 03/01/2020 CLINICAL DATA:  Abdominal distension.  Possible hernia. EXAM: CT ABDOMEN AND PELVIS WITH CONTRAST TECHNIQUE: Multidetector CT imaging of the abdomen and pelvis was performed using the standard protocol following bolus administration of intravenous contrast. CONTRAST:  ISOVUE-300 IOPAMIDOL (ISOVUE-300) INJECTION 61% COMPARISON:  Abdominal ultrasound 11/02/2013  FINDINGS: Lower chest: No acute finding. Hepatobiliary: No focal liver abnormality is seen. No gallstones, gallbladder wall thickening, or biliary dilatation. Pancreas: Unremarkable. Spleen: Unremarkable. Adrenals/Urinary Tract: Unremarkable adrenal glands. Mild left  renal scarring. No renal calculi, mass, or hydronephrosis. Unremarkable bladder. Stomach/Bowel: The stomach is unremarkable. There is no evidence of bowel obstruction. There is diffuse colonic diverticulosis, greatest in the descending and sigmoid colon, without evidence of acute diverticulitis. The appendix is unremarkable. Vascular/Lymphatic: Abdominal aortic atherosclerosis without aneurysm. No enlarged lymph nodes. Reproductive: Status post hysterectomy.  Unremarkable ovaries. Other: Pelvic floor laxity. Right-sided infraumbilical hernia containing fat and fluid. The hernia neck measures 1.9 cm in diameter, and the hernia sac measures 7.4 cm in maximal diameter. There is stranding and/or vascular engorgement within the herniated fat and adjacent omentum. There is also a small right-sided inguinal hernia containing fat and fluid. Musculoskeletal: No suspicious osseous lesion. Moderate lumbar disc and facet degeneration. IMPRESSION: 1. Moderate-sized right-sided infraumbilical hernia containing fat and fluid. Stranding in the herniated fat, correlate for acute clinical symptoms. 2. Small right-sided inguinal hernia containing fat and fluid. 3. Colonic diverticulosis. 4. Aortic Atherosclerosis (ICD10-I70.0). Electronically Signed   By: Sebastian Ache M.D.   On: 03/01/2020 16:28    Assessment & Plan:    Sonda Primes, MD

## 2020-03-21 DIAGNOSIS — Z23 Encounter for immunization: Secondary | ICD-10-CM | POA: Diagnosis not present

## 2020-03-26 ENCOUNTER — Telehealth: Payer: Self-pay | Admitting: Internal Medicine

## 2020-03-26 NOTE — Telephone Encounter (Signed)
Patient was prescribed montelukast 10 mg once a day by her allergist and she wants to make sure it was okay to take. She saw all the side effects and was worried.  [phone]

## 2020-03-27 NOTE — Telephone Encounter (Signed)
Notified pt w/MD response.../lmb 

## 2020-03-27 NOTE — Telephone Encounter (Signed)
OK to take if she thinks it is helping her.She can check w/her allergist too. Thx

## 2020-04-08 ENCOUNTER — Telehealth: Payer: Self-pay | Admitting: *Deleted

## 2020-04-08 NOTE — Telephone Encounter (Signed)
   [facility] Pre-operative Risk Assessment    HEARTCARE STAFF: - Please ensure there is not already an duplicate clearance open for this procedure. - Under Visit Info/Reason for Call, type in Other and utilize the format Clearance MM/DD/YY or Clearance TBD. Do not use dashes or single digits. - If request is for dental extraction, please clarify the # of teeth to be extracted.  Request for surgical clearance:  1. What type of surgery is being performed?  LAP ASSISTED VENTRAL HERNIA REPAIR   2. When is this surgery scheduled?  TBD   3. What type of clearance is required (medical clearance vs. Pharmacy clearance to hold med vs. Both)?  MEDICAL  4. Are there any medications that need to be held prior to surgery and how long? N/A   5. Practice name and name of physician performing surgery?  [facility] / DR. MARTIN   6. What is the office phone number?  [phone]   7.   What is the office fax number?  [phone]  8.   Anesthesia type (None, local, MAC, general) ?  GENERAL   [name] 04/08/2020, 4:26 PM  _________________________________________________________________   (provider comments below)

## 2020-04-09 NOTE — Telephone Encounter (Signed)
Primary Cardiologist:No primary care provider on file.  Chart reviewed as part of pre-operative protocol coverage. Because of [name]'s past medical history and time since last visit, he/she will require a follow-up visit in order to better assess preoperative cardiovascular risk.  Pre-op covering staff: - Please schedule appointment and call patient to inform them. - Please contact requesting surgeon's office via preferred method (i.e, phone, fax) to inform them of need for appointment prior to surgery.  If applicable, this message will also be routed to pharmacy pool and/or primary cardiologist for input on holding anticoagulant/antiplatelet agent as requested below so that this information is available at time of patient's appointment.   Deberah Pelton, NP  04/09/2020, 9:17 AM

## 2020-04-09 NOTE — Telephone Encounter (Signed)
Called and scheduled NEW pt appt with Dr Audie Box 04-15-20 for pre-op clearance.

## 2020-04-14 NOTE — Progress Notes (Signed)
Cardiology Office Note:   Date:  04/15/2020  NAME:  [name]    MRN: [id number] DOB:  [birth year]   PCP:  Tresa Garter, MD  Cardiologist:  No primary care provider on file.   Referring MD: Luretha Murphy, MD   Chief Complaint  Patient presents with  . Pre-op Exam        History of Present Illness:   [name] is a 87 y.o. female with a hx of HTN, HLD, abdominal hernia who is being seen today for the evaluation of preoperative assessment at the request of Plotnikov, Georgina Quint, MD.  She is recently been diagnosed with an abdominal hernia.  She will undergo surgery with Dr. Luretha Murphy at [facility].  Regarding medical history she has high blood pressure and hyperlipidemia.  She is fairly healthy.  She is never had a heart attack or stroke.  No history of congestive heart failure.  No history of kidney disease or diabetes.  She is a retired Technical brewer.  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  She reports she does not exercise routinely but does activity such as walking around the grocery store.  She apparently can walk around the store for up to 1 hour without any significant chest pain or shortness of breath.  She reports no limitations other than her left knee.  Her EKG today demonstrates sinus bradycardia with nonspecific ST-T changes.  She is without cardiac complaints or symptoms.  She is never smoker.  She does not drink alcohol or use drugs.  She is a great great grandmother.  No family history of heart disease.  Blood pressure 150/78.  She takes medication.  Seems to be within limits at other visits.  She denies any major symptoms today.  Problem List 1. HTN 2. HLD -T chol 172, HDL 54, LDL 103, TG 76  Past Medical History: Past Medical History:  Diagnosis Date  . Allergy   . Allergy, unspecified not elsewhere classified   . Anxiety   . Asthma   . Cataract    bil cataracts removed  . Diverticulitis   . Diverticulosis of colon  (without mention of hemorrhage)   . DJD (degenerative joint disease)   . Gastritis   . GERD (gastroesophageal reflux disease)   . Glaucoma   . History of GI diverticular bleed   . HTN (hypertension)   . Hyperlipidemia   . LPRD (laryngopharyngeal reflux disease)     Past Surgical History: Past Surgical History:  Procedure Laterality Date  . BLADDER REPAIR     after perforation  . cataract surgery  2011   w/ IOL Nile Riggs)  . COLONOSCOPY    . KIDNEY SURGERY    . KNEE ARTHROSCOPY  1998   left  . UPPER GASTROINTESTINAL ENDOSCOPY    . WISDOM TOOTH EXTRACTION      Current Medications: Current Meds  Medication Sig  . acetaminophen (TYLENOL) 325 MG tablet Take 650 mg by mouth every 6 (six) hours as needed.  Marland Kitchen amLODipine (NORVASC) 5 MG tablet TAKE 1 TABLET(5 MG) BY MOUTH DAILY  . ATROVENT HFA 17 MCG/ACT inhaler INL 2 PFS PO QID  . BREO ELLIPTA 200-25 MCG/INH AEPB INL 1 PUFF PO QD  . BYSTOLIC 20 MG TABS TAKE 1 TABLET BY MOUTH EVERY DAY  . cloNIDine (CATAPRES) 0.1 MG tablet Take 1 tablet (0.1 mg total) by mouth 2 (two) times daily as needed.  . diclofenac sodium (VOLTAREN) 1 %  GEL apply 4 grams to affected area four times a day  . ezetimibe (ZETIA) 10 MG tablet TAKE 1 TABLET BY MOUTH EVERY DAY  . fexofenadine (ALLEGRA) 180 MG tablet Take 180 mg by mouth daily.  Marland Kitchen FLOVENT HFA 110 MCG/ACT inhaler Inhale 2 puffs into the lungs 2 (two) times daily.  . furosemide (LASIX) 40 MG tablet Take 1 tablet (40 mg total) by mouth daily.  Marland Kitchen ipratropium (ATROVENT) 0.06 % nasal spray Place 2 sprays into both nostrils as needed for rhinitis.  Marland Kitchen latanoprost (XALATAN) 0.005 % ophthalmic solution INT 1 GTT IN OU D  . losartan (COZAAR) 100 MG tablet TAKE 1 TABLET BY MOUTH EVERY DAY  . Multiple Vitamins-Minerals (MULTIVITAMIN WITH MINERALS) tablet Take 1 tablet by mouth daily.  Marland Kitchen omeprazole (PRILOSEC) 40 MG capsule TAKE 1 CAPSULE BY MOUTH ONCE DAILY  . prednisoLONE acetate (PRED FORTE) 1 % ophthalmic  suspension SHAKE LQ AND INT 1 GTT IN OU QID  . sucralfate (CARAFATE) 1 g tablet Take 1 tablet (1 g total) by mouth 4 (four) times daily -  with meals and at bedtime.  . Vitamin D, Ergocalciferol, (DRISDOL) 1.25 MG (50000 UNIT) CAPS capsule Take 1 capsule (50,000 Units total) by mouth every 7 (seven) days.     Allergies:    Budesonide-formoterol fumarate, Codeine, Coreg [carvedilol], Guaifenesin, Indapamide, and Singulair [montelukast sodium]   Social History: Social History   Socioeconomic History  . Marital status: Widowed    Spouse name: Not on file  . Number of children: 4  . Years of education: 80  . Highest education level: High school graduate  Occupational History  . 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 Occupation: Multimedia programmer: Runner, broadcasting/film/video  Tobacco Use  . Smoking status: Never Smoker  . Smokeless tobacco: Never Used  Vaping Use  . Vaping Use: Never used  Substance and Sexual Activity  . Alcohol use: No  . Drug use: No  . Sexual activity: Never  Other Topics Concern  . Not on file  Social History Narrative   HSG. Married '56 - '87, widowed. 2 sons > '53, '62. 2 daughters > '59, '61. 9 granddaughters. 12 great-grandchildren. Lives in own home and son lives with her.  Retired.   ACP - asked but not answered (June '14). Referred to [email or web address]   Social Determinants of Health   Financial Resource Strain: Not on file  Food Insecurity: Not on file  Transportation Needs: Not on file  Physical Activity: Not on file  Stress: Not on file  Social Connections: Not on file    Family History: The patient's family history includes Arthritis in her mother; Cancer in her brother and sister; Heart disease in her father; Other in her mother. There is no history of Colon cancer, Esophageal cancer, Pancreatic cancer, Prostate cancer, Rectal cancer, or Stomach cancer.  ROS:   All other ROS reviewed and negative. Pertinent positives noted in the HPI.      EKGs/Labs/Other Studies Reviewed:   The following studies were personally reviewed by me today:  EKG:  EKG is ordered today.  The ekg ordered today demonstrates sinus bradycardia heart rate 57, nonspecific ST-T changes, and was personally reviewed by me.   Carotid US 10/27/2017 Final Interpretation:  Right Carotid: Velocities in the right ICA are consistent with a 1-39%  stenosis.   Left Carotid: Velocities in the left ICA are consistent with a 1-39%  stenosis.   Vertebrals: Bilateral vertebral arteries demonstrate antegrade flow.  Subclavians: Normal flow hemodynamics were seen in bilateral subclavian        arteries.   TTE 03/04/2017 - Left ventricle: The cavity size was normal. Systolic function was  normal. The estimated ejection fraction was in the range of 55%  to 60%. Wall motion was normal; there were no regional wall  motion abnormalities. Left ventricular diastolic function  parameters were normal.  - Mitral valve: There was mild regurgitation.  - Left atrium: The atrium was mildly dilated.  - Right atrium: The atrium was mildly dilated.  - Atrial septum: No defect or patent foramen ovale was identified.   Recent Labs: 10/26/2019: TSH 1.71 02/13/2020: ALT 7; BUN 10; Creatinine, Ser 1.10; Hemoglobin 12.2; Platelets 271.0; Potassium 3.6; Sodium 142   Recent Lipid Panel    Component Value Date/Time   CHOL 172 09/08/2018 1156   TRIG 76.0 09/08/2018 1156   HDL 54.00 09/08/2018 1156   CHOLHDL 3 09/08/2018 1156   VLDL 15.2 09/08/2018 1156   LDLCALC 103 (H) 09/08/2018 1156    Physical Exam:   VS:  BP (!) 150/78   Pulse (!) 57   Ht 5' (1.524 m)   Wt 163 lb (73.9 kg)   SpO2 99%   BMI 31.83 kg/m    Wt Readings from Last 3 Encounters:  04/15/20 163 lb (73.9 kg)  03/12/20 158 lb (71.7 kg)  02/13/20 162 lb 12.8 oz (73.8 kg)    General: Well nourished, well developed, in no acute distress Head: Atraumatic, normal size  Eyes: PEERLA, EOMI   Neck: Supple, no JVD Endocrine: No thryomegaly Cardiac: Normal S1, S2; RRR; no murmurs, rubs, or gallops Lungs: Clear to auscultation bilaterally, no wheezing, rhonchi or rales  Abd: Soft, nontender, no hepatomegaly  Ext: No edema, pulses 2+ Musculoskeletal: No deformities, BUE and BLE strength normal and equal Skin: Warm and dry, no rashes   Neuro: Alert and oriented to person, place, time, and situation, CNII-XII grossly intact, no focal deficits  Psych: Normal mood and affect   ASSESSMENT:   JINA GALANOS is a 87 y.o. female who presents for the following: 1. Preoperative cardiovascular examination   2. Essential hypertension   3. Mixed hyperlipidemia    PLAN:    1. Preoperative cardiovascular examination -The Revised Cardiac Risk Index = 0 which equates to 0.4% (very low risk) estimated risk of perioperative myocardial infarction, pulmonary edema, ventricular fibrillation, cardiac arrest, or complete heart block.  -She can complete greater than 4 METs without any significant chest pain or shortness of breath.  EKG with nonspecific ST-T changes.  Echocardiogram in 2018 with normal LV function.   -No further testing indicated prior to surgery. -Our service is available as needed in the peri-operative period.    2. Essential hypertension -Managed per PCP.  3. Mixed hyperlipidemia Managed per PCP.  Disposition: Return if symptoms worsen or fail to improve.  Medication Adjustments/Labs and Tests Ordered: Current medicines are reviewed at length with the patient today.  Concerns regarding medicines are outlined above.  Orders Placed This Encounter  Procedures  . EKG 12-Lead   No orders of the defined types were placed in this encounter.   Patient Instructions  Medication Instructions:  The current medical regimen is effective;  continue present plan and medications.  *If you need a refill on your cardiac medications before your next appointment, please call your  pharmacy*  Follow-Up: At [facility], you and your health needs are our priority.  As part of our continuing mission  to provide you with exceptional heart care, we have created designated Provider Care Teams.  These Care Teams include your primary Cardiologist (physician) and Advanced Practice Providers (APPs -  Physician Assistants and Nurse Practitioners) who all work together to provide you with the care you need, when you need it.  We recommend signing up for the patient portal called "MyChart".  Sign up information is provided on this After Visit Summary.  MyChart is used to connect with patients for Virtual Visits (Telemedicine).  Patients are able to view lab/test results, encounter notes, upcoming appointments, etc.  Non-urgent messages can be sent to your provider as well.   To learn more about what you can do with MyChart, go to [email or web address].    Your next appointment:   As needed  The format for your next appointment:   In Person  Provider:   Lennie Odor, MD        Signed, Lenna Gilford. Flora Lipps, MD [facility]  [address], [address] [place], [place] [address] [phone]  04/15/2020 12:01 PM

## 2020-04-15 ENCOUNTER — Encounter: Payer: Self-pay | Admitting: Cardiovascular Disease

## 2020-04-15 ENCOUNTER — Ambulatory Visit (INDEPENDENT_AMBULATORY_CARE_PROVIDER_SITE_OTHER): Payer: Medicare Other | Admitting: Cardiovascular Disease

## 2020-04-15 ENCOUNTER — Other Ambulatory Visit: Payer: Self-pay

## 2020-04-15 VITALS — BP 150/78 | HR 57 | Ht 60.0 in | Wt 163.0 lb

## 2020-04-15 DIAGNOSIS — Z01810 Encounter for preprocedural cardiovascular examination: Secondary | ICD-10-CM

## 2020-04-15 DIAGNOSIS — I10 Essential (primary) hypertension: Secondary | ICD-10-CM | POA: Diagnosis not present

## 2020-04-15 DIAGNOSIS — I6523 Occlusion and stenosis of bilateral carotid arteries: Secondary | ICD-10-CM

## 2020-04-15 DIAGNOSIS — E782 Mixed hyperlipidemia: Secondary | ICD-10-CM

## 2020-04-15 NOTE — Patient Instructions (Signed)
Medication Instructions:  The current medical regimen is effective;  continue present plan and medications.  *If you need a refill on your cardiac medications before your next appointment, please call your pharmacy*    Follow-Up: At [facility], you and your health needs are our priority.  As part of our continuing mission to provide you with exceptional heart care, we have created designated Provider Care Teams.  These Care Teams include your primary Cardiologist (physician) and Advanced Practice Providers (APPs -  Physician Assistants and Nurse Practitioners) who all work together to provide you with the care you need, when you need it.  We recommend signing up for the patient portal called "MyChart".  Sign up information is provided on this After Visit Summary.  MyChart is used to connect with patients for Virtual Visits (Telemedicine).  Patients are able to view lab/test results, encounter notes, upcoming appointments, etc.  Non-urgent messages can be sent to your provider as well.   To learn more about what you can do with MyChart, go to [email or web address].    Your next appointment:   As needed  The format for your next appointment:   In Person  Provider:   [place] O'Neal, MD      

## 2020-04-19 ENCOUNTER — Other Ambulatory Visit: Payer: Self-pay | Admitting: Internal Medicine

## 2020-04-19 ENCOUNTER — Telehealth: Payer: Self-pay | Admitting: Internal Medicine

## 2020-04-19 MED ORDER — DICLOFENAC SODIUM 1 % EX GEL
4.0000 g | Freq: Four times a day (QID) | CUTANEOUS | 3 refills | Status: DC
Start: 2020-04-19 — End: 2022-01-06

## 2020-04-19 NOTE — Telephone Encounter (Signed)
1.Medication Requested: diclofenac sodium (VOLTAREN) 1 % GEL    2. Pharmacy (Name, Street, [place]): [phone] - [place], [address] AT [address]  3. On Med List: yes   4. Last Visit with PCP: 12.28.21  5. Next visit date with PCP: n/a    Agent: Please be advised that RX refills may take up to 3 business days. We ask that you follow-up with your pharmacy.

## 2020-04-19 NOTE — Telephone Encounter (Signed)
Reviewed chart pt is up-to-date sent refills to pof.../lmb  

## 2020-04-23 ENCOUNTER — Telehealth: Payer: Self-pay | Admitting: *Deleted

## 2020-04-23 NOTE — Telephone Encounter (Signed)
Rec'd PA for pt Diclofenac Gel 1%. Completed via cover-my-meds w/ Key: [id number]. Rec'd msg stating " Your information has been submitted to [place] [employer]. [employer] will review the request and will issue a decision, typically within 1-3 days from your submission".Marland KitchenJohny Chess

## 2020-04-24 NOTE — Telephone Encounter (Signed)
Check status on PA med was approved. Sent approval information to pof.Marland KitchenJohny Chess

## 2020-05-05 ENCOUNTER — Other Ambulatory Visit: Payer: Self-pay | Admitting: Internal Medicine

## 2020-05-08 ENCOUNTER — Telehealth: Payer: Self-pay | Admitting: Internal Medicine

## 2020-05-08 NOTE — Telephone Encounter (Signed)
Patient requesting medication list be faxed to allergy specialists Phone [phone] Fax [phone]

## 2020-05-08 NOTE — Telephone Encounter (Signed)
Faxed over allergy list to fax # below,,/lmb

## 2020-05-16 DIAGNOSIS — J3089 Other allergic rhinitis: Secondary | ICD-10-CM | POA: Diagnosis not present

## 2020-05-16 DIAGNOSIS — J301 Allergic rhinitis due to pollen: Secondary | ICD-10-CM | POA: Diagnosis not present

## 2020-05-16 DIAGNOSIS — J4530 Mild persistent asthma, uncomplicated: Secondary | ICD-10-CM | POA: Diagnosis not present

## 2020-05-23 NOTE — Progress Notes (Signed)
Left a message with answering service requesting orders for epic from surgeon.

## 2020-05-27 DIAGNOSIS — H18421 Band keratopathy, right eye: Secondary | ICD-10-CM | POA: Diagnosis not present

## 2020-05-27 DIAGNOSIS — H401131 Primary open-angle glaucoma, bilateral, mild stage: Secondary | ICD-10-CM | POA: Diagnosis not present

## 2020-06-04 NOTE — Progress Notes (Signed)
DUE TO COVID-19 ONLY ONE VISITOR IS ALLOWED TO COME WITH YOU AND STAY IN THE WAITING ROOM ONLY DURING PRE OP AND PROCEDURE DAY OF SURGERY. THE 1 VISITOR  MAY VISIT WITH YOU AFTER SURGERY IN YOUR PRIVATE ROOM DURING VISITING HOURS ONLY!  YOU NEED TO HAVE A COVID 19 TEST ON___3/24/22____ @_______ , THIS TEST MUST BE DONE BEFORE SURGERY,  COVID TESTING SITE [address] [place] [address], IT IS ON THE RIGHT GOING OUT [address] APPROXIMATELY  2 MINUTES PAST [employer] ON THE RIGHT. ONCE YOUR COVID TEST IS COMPLETED,  PLEASE BEGIN THE QUARANTINE INSTRUCTIONS AS OUTLINED IN YOUR HANDOUT.                JILL STOPKA  06/04/2020   Your procedure is scheduled on:  06/10/20  Report to [facility] Main  Entrance   Report to admitting at    1000 AM     Call this number if you have problems the morning of surgery [phone]    REMEMBER: NO  SOLID FOOD CANDY OR GUM AFTER MIDNIGHT. CLEAR LIQUIDS UNTIL  0900am        . NOTHING BY MOUTH EXCEPT CLEAR LIQUIDS UNTIL     0900am   . PLEASE FINISH ENSURE DRINK PER SURGEON ORDER  WHICH NEEDS TO BE COMPLETED AT   0900am   .      CLEAR LIQUID DIET   Foods Allowed                                                                    Coffee and tea, regular and decaf                            Fruit ices (not with fruit pulp)                                      Iced Popsicles                                    Carbonated beverages, regular and diet                                    Cranberry, grape and apple juices Sports drinks like Gatorade Lightly seasoned clear broth or consume(fat free) Sugar, honey syrup ___________________________________________________________________      BRUSH YOUR TEETH MORNING OF SURGERY AND RINSE YOUR MOUTH OUT, NO CHEWING GUM CANDY OR MINTS.     Take these medicines the morning of surgery with A SIP OF WATER: inhalers as usual and birng, Bystolic, Clonidine, Clarinex, Allegra,  Prilosec, Eye drops as usual   DO NOT TAKE ANY DIABETIC MEDICATIONS DAY OF YOUR SURGERY                               You may not have any metal on your body including hair pins and  piercings  Do not wear jewelry, make-up, lotions, powders or perfumes, deodorant             Do not wear nail polish on your fingernails.  Do not shave  48 hours prior to surgery.              Men may shave face and neck.   Do not bring valuables to the hospital. [place].  Contacts, dentures or bridgework may not be worn into surgery.  Leave suitcase in the car. After surgery it may be brought to your room.     Patients discharged the day of surgery will not be allowed to drive home. IF YOU ARE HAVING SURGERY AND GOING HOME THE SAME DAY, YOU MUST HAVE AN ADULT TO DRIVE YOU HOME AND BE WITH YOU FOR 24 HOURS. YOU MAY GO HOME BY TAXI OR UBER OR ORTHERWISE, BUT AN ADULT MUST ACCOMPANY YOU HOME AND STAY WITH YOU FOR 24 HOURS.  Name and phone number of your driver:  Special Instructions: N/A              Please read over the following fact sheets you were given: _____________________________________________________________________  [facility] - Preparing for Surgery Before surgery, you can play an important role.  Because skin is not sterile, your skin needs to be as free of germs as possible.  You can reduce the number of germs on your skin by washing with CHG (chlorahexidine gluconate) soap before surgery.  CHG is an antiseptic cleaner which kills germs and bonds with the skin to continue killing germs even after washing. Please DO NOT use if you have an allergy to CHG or antibacterial soaps.  If your skin becomes reddened/irritated stop using the CHG and inform your nurse when you arrive at Short Stay. Do not shave (including legs and underarms) for at least 48 hours prior to the first CHG shower.  You may shave your face/neck. Please follow these  instructions carefully:  1.  Shower with CHG Soap the night before surgery and the  morning of Surgery.  2.  If you choose to wash your hair, wash your hair first as usual with your  normal  shampoo.  3.  After you shampoo, rinse your hair and body thoroughly to remove the  shampoo.                           4.  Use CHG as you would any other liquid soap.  You can apply chg directly  to the skin and wash                       Gently with a scrungie or clean washcloth.  5.  Apply the CHG Soap to your body ONLY FROM THE NECK DOWN.   Do not use on face/ open                           Wound or open sores. Avoid contact with eyes, ears mouth and genitals (private parts).                       Wash face,  Genitals (private parts) with your normal soap.             6.  Wash thoroughly, paying special attention to the area where your surgery  will be performed.  7.  Thoroughly rinse your body with warm water from the neck down.  8.  DO NOT shower/wash with your normal soap after using and rinsing off  the CHG Soap.                9.  Pat yourself dry with a clean towel.            10.  Wear clean pajamas.            11.  Place clean sheets on your bed the night of your first shower and do not  sleep with pets. Day of Surgery : Do not apply any lotions/deodorants the morning of surgery.  Please wear clean clothes to the hospital/surgery center.  FAILURE TO FOLLOW THESE INSTRUCTIONS MAY RESULT IN THE CANCELLATION OF YOUR SURGERY PATIENT SIGNATURE_________________________________  NURSE SIGNATURE__________________________________  ________________________________________________________________________

## 2020-06-05 ENCOUNTER — Inpatient Hospital Stay (HOSPITAL_COMMUNITY): Admission: RE | Admit: 2020-06-05 | Payer: Medicare Other | Source: Ambulatory Visit

## 2020-06-06 ENCOUNTER — Other Ambulatory Visit: Payer: Self-pay

## 2020-06-06 ENCOUNTER — Other Ambulatory Visit (HOSPITAL_COMMUNITY)
Admission: RE | Admit: 2020-06-06 | Discharge: 2020-06-06 | Disposition: A | Discharge status: Home / Self Care | Payer: Medicare Other | Source: Ambulatory Visit | Attending: Surgery | Admitting: Surgery

## 2020-06-06 ENCOUNTER — Encounter (HOSPITAL_COMMUNITY): Payer: Self-pay

## 2020-06-06 ENCOUNTER — Encounter (HOSPITAL_COMMUNITY)
Admission: RE | Admit: 2020-06-06 | Discharge: 2020-06-06 | Disposition: A | Discharge status: Home / Self Care | Payer: Medicare Other | Source: Ambulatory Visit | Attending: Surgery | Admitting: Surgery

## 2020-06-06 DIAGNOSIS — Z01812 Encounter for preprocedural laboratory examination: Secondary | ICD-10-CM | POA: Insufficient documentation

## 2020-06-06 DIAGNOSIS — Z20822 Contact with and (suspected) exposure to covid-19: Secondary | ICD-10-CM | POA: Diagnosis not present

## 2020-06-06 LAB — CBC
HCT: 40 % (ref 36.0–46.0)
Hemoglobin: 12.5 g/dL (ref 12.0–15.0)
MCH: 26.8 pg (ref 26.0–34.0)
MCHC: 31.3 g/dL (ref 30.0–36.0)
MCV: 85.7 fL (ref 80.0–100.0)
Platelets: 306 10*3/uL (ref 150–400)
RBC: 4.67 MIL/uL (ref 3.87–5.11)
RDW: 14.7 % (ref 11.5–15.5)
WBC: 5.6 10*3/uL (ref 4.0–10.5)
nRBC: 0 % (ref 0.0–0.2)

## 2020-06-06 LAB — BASIC METABOLIC PANEL
Anion gap: 8 (ref 5–15)
BUN: 9 mg/dL (ref 8–23)
CO2: 26 mmol/L (ref 22–32)
Calcium: 9.4 mg/dL (ref 8.9–10.3)
Chloride: 110 mmol/L (ref 98–111)
Creatinine, Ser: 0.89 mg/dL (ref 0.44–1.00)
GFR, Estimated: >60 mL/min (ref >60)
Glucose, Bld: 94 mg/dL (ref 70–99)
Potassium: 3.9 mmol/L (ref 3.5–5.1)
Sodium: 144 mmol/L (ref 135–145)

## 2020-06-06 LAB — SARS CORONAVIRUS 2 (TAT 6-24 HRS): SARS Coronavirus 2: NEGATIVE

## 2020-06-06 NOTE — Anesthesia Preprocedure Evaluation (Addendum)
Anesthesia Evaluation  Patient identified by MRN, date of birth, ID band Patient awake    Reviewed: Allergy & Precautions, NPO status , Patient's Chart, lab work & pertinent test results  Airway Mallampati: II  TM Distance: >3 FB Neck ROM: Full    Dental no notable dental hx. (+) Upper Dentures, Partial Lower, Dental Advisory Given   Pulmonary asthma ,    Pulmonary exam normal breath sounds clear to auscultation       Cardiovascular hypertension, Pt. on medications and Pt. on home beta blockers Normal cardiovascular exam Rhythm:Regular Rate:Normal  04/15/20 EKG SB R 57 w NSST changes  02/2017 Echo Left ventricle: The cavity size was normal. Systolic function was  normal. The estimated ejection fraction was in the range of 55%  to 60%. Wall motion was normal; there were no regional wall  motion abnormalities. Left ventricular diastolic function  parameters were normal.    Neuro/Psych negative neurological ROS  negative psych ROS   GI/Hepatic Neg liver ROS, GERD  Medicated,  Endo/Other  negative endocrine ROS  Renal/GU K+ 3.9     Musculoskeletal  (+) Arthritis ,   Abdominal   Peds  Hematology Hgd 12.5 Plt 306   Anesthesia Other Findings All See list  Reproductive/Obstetrics                          Anesthesia Physical Anesthesia Plan  ASA: III  Anesthesia Plan: General   Post-op Pain Management:    Induction: Inhalational  PONV Risk Score and Plan: Treatment may vary due to age or medical condition and Ondansetron  Airway Management Planned: Oral ETT  Additional Equipment: None  Intra-op Plan:   Post-operative Plan: Extubation in OR  Informed Consent: I have reviewed the patients History and Physical, chart, labs and discussed the procedure including the risks, benefits and alternatives for the proposed anesthesia with the patient or authorized representative who has  indicated his/her understanding and acceptance.     Dental advisory given  Plan Discussed with: CRNA  Anesthesia Plan Comments: (Per cardiology, "Preoperative cardiovascular examination -The Revised Cardiac Risk Index = 0 which equates to 0.4% (very low risk) estimated risk of perioperative myocardial infarction, pulmonary edema, ventricular fibrillation, cardiac arrest, or complete heart block.  -She can complete greater than 4 METs without any significant chest pain or shortness of breath.  EKG with nonspecific ST-T changes.  Echocardiogram in 2018 with normal LV function.   -No further testing indicated prior to surgery. -Our service is available as needed in the peri-operative period."  GA w lidocaine infusion)      Anesthesia Quick Evaluation

## 2020-06-06 NOTE — Progress Notes (Signed)
Anesthesia Review:  PCP: DR Walker Kehr Lov 03/12/2020  Cardiologist :Eleonore Chiquito Clearance visit 04/15/20 Chest x-ray : EKG :04/15/20 Echo :2018  Carotids- 2019  Stress test: Cardiac Cath :  Activity level: can do a flgiht of stairs without difficulty  Sleep Study/ CPAP :no  Fasting Blood Sugar :      / Checks Blood Sugar -- times a day:   Blood Thinner/ Instructions /Last Dose: ASA / Instructions/ Last Dose :  Blood pressure at preop was 186/77.  Pt denies any dizziness , headache or chest pain.  Pt states " I feel fine".  Pt was very talkative at preop appt.  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

## 2020-06-07 NOTE — Progress Notes (Signed)
Called pt at home and verified that she was taking  Amlodipine even though pharmacy had it listed she was not taking.  Verified with pt that she takes amlodipine, clonidine, bystolic and cozaar in the am for blood pressure.  Pt instructed to take Amlodpine, , Bystolic, and Clonidine am of surgery as usually takes in the am along with allegra and Atrovent Inhaler as usually takes in the am. PT voiced understanding.

## 2020-06-09 MED ORDER — BUPIVACAINE LIPOSOME 1.3 % IJ SUSP
20.0000 mL | INTRAMUSCULAR | Status: DC
  Filled 2020-06-09: qty 20

## 2020-06-09 NOTE — H&P (Signed)
Chief Complaint:  Symptomatic ventral hernia  History of Present Illness:  [name] is an 87 y.o. female that I saw in the office in January.  She has a 7 cm hernia to the right of the umbilicus from prior surgery.  Plan lap asssisted ventral hernia repair  Past Medical History:  Diagnosis Date  . Allergy   . Allergy, unspecified not elsewhere classified   . Anxiety    pt denies   . Asthma   . Cataract    bil cataracts removed  . Diverticulitis   . Diverticulosis of colon (without mention of hemorrhage)   . DJD (degenerative joint disease)   . Gastritis   . GERD (gastroesophageal reflux disease)   . Glaucoma   . History of GI diverticular bleed   . HTN (hypertension)   . Hyperlipidemia   . LPRD (laryngopharyngeal reflux disease)     Past Surgical History:  Procedure Laterality Date  . BLADDER REPAIR     after perforation  . cataract surgery  2011   w/ IOL Gershon Crane)  . COLONOSCOPY    . KIDNEY SURGERY    . KNEE ARTHROSCOPY  1998   left  . UPPER GASTROINTESTINAL ENDOSCOPY    . WISDOM TOOTH EXTRACTION      Current Facility-Administered Medications  Medication Dose Route Frequency Provider Last Rate Last Admin  . [START ON 06/10/2020] bupivacaine liposome (EXPAREL) 1.3 % injection 266 mg  20 mL Infiltration On Call to OR Polly Cobia, [facility]       Current Outpatient Medications  Medication Sig Dispense Refill  . acetaminophen (TYLENOL) 325 MG tablet Take 650 mg by mouth every 6 (six) hours as needed for moderate pain.    . ATROVENT HFA 17 MCG/ACT inhaler Inhale 2 puffs into the lungs in the morning.  5  . BREO ELLIPTA 200-25 MCG/INH AEPB Inhale 1 puff into the lungs every evening.  5  . BYSTOLIC 20 MG TABS TAKE 1 TABLET BY MOUTH EVERY DAY (Patient taking differently: Take 20 mg by mouth daily.) 90 tablet 3  . cloNIDine (CATAPRES) 0.1 MG tablet TAKE 1 TABLET(0.1 MG) BY MOUTH TWICE DAILY AS NEEDED TAKE FOR TOP BLOOD PRESSURE GREATER THAN 180 (Patient taking  differently: Take 0.1 mg by mouth 2 (two) times daily.) 60 tablet 3  . desloratadine (CLARINEX) 5 MG tablet Take 5 mg by mouth daily.    Marland Kitchen ezetimibe (ZETIA) 10 MG tablet TAKE 1 TABLET BY MOUTH EVERY DAY (Patient taking differently: Take 10 mg by mouth daily.) 30 tablet 11  . fexofenadine (ALLEGRA) 180 MG tablet Take 180 mg by mouth daily.    . furosemide (LASIX) 40 MG tablet Take 1 tablet (40 mg total) by mouth daily. 90 tablet 3  . ipratropium (ATROVENT) 0.06 % nasal spray Place 2 sprays into both nostrils as needed for rhinitis.    Marland Kitchen latanoprost (XALATAN) 0.005 % ophthalmic solution Place 1 drop into both eyes at bedtime.  3  . losartan (COZAAR) 100 MG tablet TAKE 1 TABLET BY MOUTH EVERY DAY (Patient taking differently: Take 100 mg by mouth daily.) 90 tablet 3  . Multiple Vitamins-Minerals (MULTIVITAMIN WITH MINERALS) tablet Take 1 tablet by mouth daily.    Marland Kitchen omeprazole (PRILOSEC) 40 MG capsule TAKE 1 CAPSULE BY MOUTH ONCE DAILY (Patient taking differently: Take 40 mg by mouth daily.) 30 capsule 11  . prednisoLONE acetate (PRED FORTE) 1 % ophthalmic suspension Place 1 drop into the right eye daily.  0  . sucralfate (  CARAFATE) 1 g tablet TAKE 1 TABLET(1 GRAM) BY MOUTH FOUR TIMES DAILY AT BEDTIME WITH MEALS (Patient taking differently: Take 1 g by mouth 4 (four) times daily -  with meals and at bedtime.) 120 tablet 5  . amLODipine (NORVASC) 5 MG tablet TAKE 1 TABLET(5 MG) BY MOUTH DAILY (Patient not taking: No sig reported) 90 tablet 5  . diclofenac sodium (VOLTAREN) 1 % GEL apply 4 grams to affected area four times a day (Patient not taking: No sig reported) 200 g 4  . diclofenac Sodium (VOLTAREN) 1 % GEL Apply 4 g topically 4 (four) times daily. (Patient not taking: Reported on 05/27/2020) 200 g 3  . fexofenadine (ALLEGRA) 180 MG tablet Take 180 mg by mouth daily. (Patient not taking: Reported on 05/27/2020)    . Vitamin D, Ergocalciferol, (DRISDOL) 1.25 MG (50000 UNIT) CAPS capsule Take 1 capsule  (50,000 Units total) by mouth every 7 (seven) days. (Patient not taking: Reported on 05/27/2020) 12 capsule 0   Budesonide-formoterol fumarate, Codeine, Coreg [carvedilol], Guaifenesin, Indapamide, and Singulair [montelukast sodium] Family History  Problem Relation Age of Onset  . Heart disease Father        ??  . Other Mother        bladder tumor  . Arthritis Mother   . Cancer Sister   . Cancer Brother   . Colon cancer Neg Hx   . Esophageal cancer Neg Hx   . Pancreatic cancer Neg Hx   . Prostate cancer Neg Hx   . Rectal cancer Neg Hx   . Stomach cancer Neg Hx    Social History:   reports that she has never smoked. She has never used smokeless tobacco. She reports that she does not drink alcohol and does not use drugs.   REVIEW OF SYSTEMS : Negative except for see problem list  Physical Exam:   There were no vitals taken for this visit. There is no height or weight on file to calculate BMI.  Gen:  WDWN AAF NAD  Neurological: Alert and oriented to person, place, and time. Motor and sensory function is grossly intact  Head: Normocephalic and atraumatic.  Eyes: Conjunctivae are normal. Pupils are equal, round, and reactive to light. No scleral icterus.  Neck: Normal range of motion. Neck supple. No tracheal deviation or thyromegaly present.  Cardiovascular:  SR without murmurs or gallops.  No carotid bruits Breast:  Not examined Respiratory: Effort normal.  No respiratory distress. No chest wall tenderness. Breath sounds normal.  No wheezes, rales or rhonchi.  Abdomen:  Fascial defect slightly below the umbilicus on the right GU:  Not examined Musculoskeletal: Normal range of motion. Extremities are nontender. No cyanosis, edema or clubbing noted Lymphadenopathy: No cervical, preauricular, postauricular or axillary adenopathy is present Skin: Skin is warm and dry. No rash noted. No diaphoresis. No erythema. No pallor. Pscyh: Normal mood and affect. Behavior is normal. Judgment  and thought content normal.   LABORATORY RESULTS: No results found for this or any previous visit (from the past 48 hour(s)).   RADIOLOGY RESULTS: No results found.  Problem List: Patient Active Problem List   Diagnosis Date Noted  . Abdominal hernia 03/12/2020  . Abdominal enlargement 02/13/2020  . Headache 12/18/2019  . Vitamin D deficiency 11/07/2019  . New daily persistent headache 10/27/2019  . Obesity (BMI 30-39.9) 02/28/2019  . Anemia 02/28/2019  . Trigeminal neuralgia of left side of face 09/08/2018  . Knee pain, chronic 12/21/2017  . Hand weakness 12/21/2017  .  Lipoma 08/31/2017  . Moderate persistent asthma with exacerbation 05/21/2017  . Cough 02/17/2017  . Impacted cerumen of right ear 06/04/2015  . Carotid artery stenosis, asymptomatic, bilateral 12/06/2014  . Acute sinusitis 03/02/2014  . Abdominal mass 10/26/2013  . Dyspnea 03/28/2012  . Murmur 03/28/2012  . Need for prophylactic vaccination with tetanus-diphtheria (Td) 08/24/2011  . GERD with stricture 02/09/2011  . Well adult exam 11/13/2010  . CHRONIC LARYNGITIS 12/09/2009  . Acute bronchitis 08/06/2009  . RECTAL BLEEDING 10/23/2008  . Diverticulosis of colon (without mention of hemorrhage) 12/27/2007  . Glaucoma 12/12/2007  . DIVERTICULAR BLEEDING, HX OF 12/12/2007  . Dyslipidemia 01/20/2007  . Anxiety disorder 01/20/2007  . Essential hypertension 01/20/2007  . Asthma 01/20/2007  . Osteoarthritis 01/20/2007  . Allergic rhinitis 01/20/2007  . Acquired absence of genital organ 01/20/2007    Assessment & Plan: symptoamatic ventral hernia for lap assisted repair    Matt B. Hassell Done, MD, [facility] [phone] beeper [phone]  06/09/2020 9:43 PM

## 2020-06-10 ENCOUNTER — Ambulatory Visit (HOSPITAL_COMMUNITY): Payer: Medicare Other | Admitting: Physician Assistant

## 2020-06-10 ENCOUNTER — Ambulatory Visit (HOSPITAL_COMMUNITY): Payer: Medicare Other | Admitting: Anesthesiology

## 2020-06-10 ENCOUNTER — Encounter (HOSPITAL_COMMUNITY)
Admission: RE | Disposition: A | Discharge status: Home / Self Care | Payer: Self-pay | Source: Home / Self Care | Attending: Surgery

## 2020-06-10 ENCOUNTER — Ambulatory Visit (HOSPITAL_COMMUNITY)
Admission: RE | Admit: 2020-06-10 | Discharge: 2020-06-10 | Disposition: A | Discharge status: Home / Self Care | Payer: Medicare Other | Attending: Surgery | Admitting: Surgery

## 2020-06-10 ENCOUNTER — Encounter (HOSPITAL_COMMUNITY): Payer: Self-pay | Admitting: Surgery

## 2020-06-10 DIAGNOSIS — Z8261 Family history of arthritis: Secondary | ICD-10-CM | POA: Insufficient documentation

## 2020-06-10 DIAGNOSIS — E559 Vitamin D deficiency, unspecified: Secondary | ICD-10-CM | POA: Diagnosis not present

## 2020-06-10 DIAGNOSIS — Z961 Presence of intraocular lens: Secondary | ICD-10-CM | POA: Insufficient documentation

## 2020-06-10 DIAGNOSIS — Z888 Allergy status to other drugs, medicaments and biological substances status: Secondary | ICD-10-CM | POA: Diagnosis not present

## 2020-06-10 DIAGNOSIS — Z885 Allergy status to narcotic agent status: Secondary | ICD-10-CM | POA: Diagnosis not present

## 2020-06-10 DIAGNOSIS — Z8249 Family history of ischemic heart disease and other diseases of the circulatory system: Secondary | ICD-10-CM | POA: Diagnosis not present

## 2020-06-10 DIAGNOSIS — Z79899 Other long term (current) drug therapy: Secondary | ICD-10-CM | POA: Insufficient documentation

## 2020-06-10 DIAGNOSIS — Z809 Family history of malignant neoplasm, unspecified: Secondary | ICD-10-CM | POA: Insufficient documentation

## 2020-06-10 DIAGNOSIS — J4541 Moderate persistent asthma with (acute) exacerbation: Secondary | ICD-10-CM | POA: Diagnosis not present

## 2020-06-10 DIAGNOSIS — Z7951 Long term (current) use of inhaled steroids: Secondary | ICD-10-CM | POA: Diagnosis not present

## 2020-06-10 DIAGNOSIS — K436 Other and unspecified ventral hernia with obstruction, without gangrene: Secondary | ICD-10-CM | POA: Diagnosis not present

## 2020-06-10 DIAGNOSIS — E785 Hyperlipidemia, unspecified: Secondary | ICD-10-CM | POA: Diagnosis not present

## 2020-06-10 DIAGNOSIS — Z9849 Cataract extraction status, unspecified eye: Secondary | ICD-10-CM | POA: Diagnosis not present

## 2020-06-10 HISTORY — PX: VENTRAL HERNIA REPAIR: SHX424

## 2020-06-10 SURGERY — REPAIR, HERNIA, VENTRAL, LAPAROSCOPIC
Anesthesia: General

## 2020-06-10 MED ORDER — SCOPOLAMINE 1 MG/3DAYS TD PT72
1.0000 | MEDICATED_PATCH | TRANSDERMAL | Status: DC
  Administered 2020-06-10: 1.5 mg via TRANSDERMAL
  Filled 2020-06-10: qty 1

## 2020-06-10 MED ORDER — DEXAMETHASONE SODIUM PHOSPHATE 10 MG/ML IJ SOLN
INTRAMUSCULAR | Status: AC
  Filled 2020-06-10: qty 1

## 2020-06-10 MED ORDER — CEFAZOLIN SODIUM-DEXTROSE 2-4 GM/100ML-% IV SOLN
2.0000 g | INTRAVENOUS | Status: AC
  Administered 2020-06-10: 2 g via INTRAVENOUS
  Filled 2020-06-10: qty 100

## 2020-06-10 MED ORDER — ACETAMINOPHEN 500 MG PO TABS
1000.0000 mg | ORAL_TABLET | ORAL | Status: AC
  Administered 2020-06-10: 1000 mg via ORAL
  Filled 2020-06-10: qty 2

## 2020-06-10 MED ORDER — CHLORHEXIDINE GLUCONATE CLOTH 2 % EX PADS: 6.0000 | MEDICATED_PAD | Freq: Once | CUTANEOUS | Status: DC

## 2020-06-10 MED ORDER — ONDANSETRON HCL 4 MG/2ML IJ SOLN
INTRAMUSCULAR | Status: DC | PRN
  Administered 2020-06-10: 4 mg via INTRAVENOUS

## 2020-06-10 MED ORDER — BUPIVACAINE LIPOSOME 1.3 % IJ SUSP
INTRAMUSCULAR | Status: DC | PRN
  Administered 2020-06-10: 20 mL

## 2020-06-10 MED ORDER — HYDROCODONE-ACETAMINOPHEN 5-325 MG PO TABS: 1.0000 | ORAL_TABLET | Freq: Four times a day (QID) | ORAL | 0 refills | Status: AC | PRN

## 2020-06-10 MED ORDER — ACETAMINOPHEN 10 MG/ML IV SOLN
1000.0000 mg | Freq: Once | INTRAVENOUS | Status: DC | PRN
Start: 2020-06-10 — End: 2020-06-10

## 2020-06-10 MED ORDER — PROPOFOL 10 MG/ML IV BOLUS
INTRAVENOUS | Status: DC | PRN
  Administered 2020-06-10: 80 mg via INTRAVENOUS

## 2020-06-10 MED ORDER — ONDANSETRON HCL 4 MG/2ML IJ SOLN: 4.0000 mg | Freq: Once | INTRAMUSCULAR | Status: DC | PRN

## 2020-06-10 MED ORDER — LIDOCAINE 2% (20 MG/ML) 5 ML SYRINGE
INTRAMUSCULAR | Status: AC
  Filled 2020-06-10: qty 5

## 2020-06-10 MED ORDER — CHLORHEXIDINE GLUCONATE 0.12 % MT SOLN
15.0000 mL | Freq: Once | OROMUCOSAL | Status: AC
  Administered 2020-06-10: 15 mL via OROMUCOSAL

## 2020-06-10 MED ORDER — ORAL CARE MOUTH RINSE: 15.0000 mL | Freq: Once | OROMUCOSAL | Status: AC

## 2020-06-10 MED ORDER — GLYCOPYRROLATE 0.2 MG/ML IJ SOLN
INTRAMUSCULAR | Status: DC | PRN
  Administered 2020-06-10: .2 mg via INTRAVENOUS

## 2020-06-10 MED ORDER — ROCURONIUM BROMIDE 10 MG/ML (PF) SYRINGE
PREFILLED_SYRINGE | INTRAVENOUS | Status: DC | PRN
  Administered 2020-06-10: 40 mg via INTRAVENOUS

## 2020-06-10 MED ORDER — GLYCOPYRROLATE PF 0.2 MG/ML IJ SOSY
PREFILLED_SYRINGE | INTRAMUSCULAR | Status: AC
  Filled 2020-06-10: qty 1

## 2020-06-10 MED ORDER — SUGAMMADEX SODIUM 200 MG/2ML IV SOLN
INTRAVENOUS | Status: DC | PRN
  Administered 2020-06-10: 175 mg via INTRAVENOUS

## 2020-06-10 MED ORDER — DEXAMETHASONE SODIUM PHOSPHATE 10 MG/ML IJ SOLN
INTRAMUSCULAR | Status: DC | PRN
  Administered 2020-06-10: 10 mg via INTRAVENOUS

## 2020-06-10 MED ORDER — LACTATED RINGERS IR SOLN
Status: DC | PRN
  Administered 2020-06-10: 3000 mL

## 2020-06-10 MED ORDER — FENTANYL CITRATE (PF) 100 MCG/2ML IJ SOLN: 25.0000 ug | INTRAMUSCULAR | Status: DC | PRN

## 2020-06-10 MED ORDER — PROPOFOL 10 MG/ML IV BOLUS
INTRAVENOUS | Status: AC
  Filled 2020-06-10: qty 20

## 2020-06-10 MED ORDER — FENTANYL CITRATE (PF) 100 MCG/2ML IJ SOLN
INTRAMUSCULAR | Status: DC | PRN
  Administered 2020-06-10: 25 ug via INTRAVENOUS
  Administered 2020-06-10: 50 ug via INTRAVENOUS
  Administered 2020-06-10: 25 ug via INTRAVENOUS

## 2020-06-10 MED ORDER — LIDOCAINE 2% (20 MG/ML) 5 ML SYRINGE
INTRAMUSCULAR | Status: DC | PRN
  Administered 2020-06-10: 60 mg via INTRAVENOUS

## 2020-06-10 MED ORDER — LACTATED RINGERS IV SOLN: INTRAVENOUS | Status: DC

## 2020-06-10 MED ORDER — FENTANYL CITRATE (PF) 100 MCG/2ML IJ SOLN
INTRAMUSCULAR | Status: AC
  Filled 2020-06-10: qty 2

## 2020-06-10 MED ORDER — ROCURONIUM BROMIDE 10 MG/ML (PF) SYRINGE
PREFILLED_SYRINGE | INTRAVENOUS | Status: AC
  Filled 2020-06-10: qty 10

## 2020-06-10 MED ORDER — BUPIVACAINE-EPINEPHRINE (PF) 0.25% -1:200000 IJ SOLN
INTRAMUSCULAR | Status: AC
  Filled 2020-06-10: qty 30

## 2020-06-10 MED ORDER — ONDANSETRON HCL 4 MG/2ML IJ SOLN
INTRAMUSCULAR | Status: AC
  Filled 2020-06-10: qty 2

## 2020-06-10 MED ORDER — 0.9 % SODIUM CHLORIDE (POUR BTL) OPTIME
TOPICAL | Status: DC | PRN
  Administered 2020-06-10: 1000 mL

## 2020-06-10 SURGICAL SUPPLY — 46 items
ADH SKN CLS APL DERMABOND .7 (GAUZE/BANDAGES/DRESSINGS) ×1
APL PRP STRL LF DISP 70% ISPRP (MISCELLANEOUS) ×1
BINDER ABDOMINAL 12 ML 46-62 (SOFTGOODS) ×1 IMPLANT
CABLE HIGH FREQUENCY MONO STRZ (ELECTRODE) IMPLANT
CHLORAPREP W/TINT 26 (MISCELLANEOUS) ×2 IMPLANT
COVER SURGICAL LIGHT HANDLE (MISCELLANEOUS) ×2 IMPLANT
COVER WAND RF STERILE (DRAPES) IMPLANT
DECANTER SPIKE VIAL GLASS SM (MISCELLANEOUS) ×1 IMPLANT
DERMABOND ADVANCED (GAUZE/BANDAGES/DRESSINGS) ×1
DERMABOND ADVANCED .7 DNX12 (GAUZE/BANDAGES/DRESSINGS) ×1 IMPLANT
DEVICE SECURE STRAP 25 ABSORB (INSTRUMENTS) ×1 IMPLANT
DEVICE TROCAR PUNCTURE CLOSURE (ENDOMECHANICALS) ×2 IMPLANT
DISSECTOR BLUNT TIP ENDO 5MM (MISCELLANEOUS) IMPLANT
ELECT L-HOOK LAP 45CM DISP (ELECTROSURGICAL)
ELECT PENCIL ROCKER SW 15FT (MISCELLANEOUS) IMPLANT
ELECT REM PT RETURN 15FT ADLT (MISCELLANEOUS) ×2 IMPLANT
ELECTRODE L-HOOK LAP 45CM DISP (ELECTROSURGICAL) IMPLANT
GLOVE BIOGEL M 8.0 STRL (GLOVE) ×2 IMPLANT
GOWN STRL REUS W/TWL XL LVL3 (GOWN DISPOSABLE) ×6 IMPLANT
KIT BASIN OR (CUSTOM PROCEDURE TRAY) ×2 IMPLANT
KIT TURNOVER KIT A (KITS) ×2 IMPLANT
MARKER SKIN DUAL TIP RULER LAB (MISCELLANEOUS) ×2 IMPLANT
MESH VENTRALEX ST 1-7/10 CRC S (Mesh General) ×1 IMPLANT
NDL SPNL 22GX3.5 QUINCKE BK (NEEDLE) ×1 IMPLANT
NEEDLE SPNL 22GX3.5 QUINCKE BK (NEEDLE) ×2 IMPLANT
SCISSORS LAP 5X45 EPIX DISP (ENDOMECHANICALS) ×2 IMPLANT
SCRUB TECHNI CARE 4 OZ NO DYE (MISCELLANEOUS) ×2 IMPLANT
SET IRRIG TUBING LAPAROSCOPIC (IRRIGATION / IRRIGATOR) ×1 IMPLANT
SET TUBE SMOKE EVAC HIGH FLOW (TUBING) ×2 IMPLANT
SHEARS HARMONIC ACE PLUS 45CM (MISCELLANEOUS) ×1 IMPLANT
SLEEVE ADV FIXATION 5X100MM (TROCAR) IMPLANT
SLEEVE XCEL OPT CAN 5 100 (ENDOMECHANICALS) ×1 IMPLANT
STAPLER VISISTAT 35W (STAPLE) IMPLANT
SUT MNCRL AB 4-0 PS2 18 (SUTURE) ×2 IMPLANT
SUT NOVA NAB DX-16 0-1 5-0 T12 (SUTURE) ×2 IMPLANT
SYR 10ML ECCENTRIC (SYRINGE) IMPLANT
TACKER 5MM HERNIA 3.5CML NAB (ENDOMECHANICALS) IMPLANT
TOWEL OR 17X26 10 PK STRL BLUE (TOWEL DISPOSABLE) ×2 IMPLANT
TOWEL OR NON WOVEN STRL DISP B (DISPOSABLE) ×2 IMPLANT
TRAY FOLEY MTR SLVR 16FR STAT (SET/KITS/TRAYS/PACK) IMPLANT
TRAY LAPAROSCOPIC (CUSTOM PROCEDURE TRAY) ×2 IMPLANT
TROCAR ADV FIXATION 11X100MM (TROCAR) IMPLANT
TROCAR ADV FIXATION 5X100MM (TROCAR) IMPLANT
TROCAR BLADELESS OPT 5 100 (ENDOMECHANICALS) ×2 IMPLANT
TROCAR XCEL NON-BLD 11X100MML (ENDOMECHANICALS) IMPLANT
YANKAUER SUCT BULB TIP NO VENT (SUCTIONS) ×2 IMPLANT

## 2020-06-10 NOTE — Op Note (Signed)
[name]  1934-02-28   06/10/2020    PCP:  Cassandria Anger, MD   Surgeon: Kaylyn Lim, MD, FACS  Asst:  none  Anes:  General with Exparel in and around incisions  Preop Dx: Incarcerated ventral hernia Postop Dx: Ventral hernia incarcerated with omentum  Procedure: Laparoscopic assisted ventral hernia repair with 1.7 inch Ventralex mesh patch Location Surgery: [facility] 2 Complications: None noted  EBL:   minimal cc  Drains: none  Description of Procedure:  The patient was taken to OR 2 .  After anesthesia was administered and the patient was prepped  with Chloroprep  and a timeout was performed.  Access was attempted in the left upper quadrant with the 5 mm Optiview unsuccessfully and then competed with the 5 Optiview in the right upper quadrant.  The left upper quadrant was observed while the trocar was brought in to the abdomen.    Omentum was incarcerated in this hernia which was easily palpable to the right of the midline just below the umbilicus.  I reduce this as far as I could and then used the harmonic scalpel to excise it from the sac and control bleeding.  It then returned to the abdomen.  She had a fairly large sac and I used an old linear incision vertical and went down and stripped this cath sac out in toto and amputated it at the fascial level.  I then inserted a 1.7 inch piece of ventral Lex mesh and pull the straps up while I anchored it with 2 sutures of's 0 Novafil.  The defect was then closed with 3 sutures of 0 Novafil.  The abdomen was then reinflated and inspected and the mesh was lying nice and flat and I went ahead and tacked the perimeter with secure straps.  I injected all the incisions with Exparel for a total of 20 cc  The abdomen was deflated and then wounds were closed with 4-0 Monocryl and Dermabond.  The patient tolerated the procedure well and was taken to the PACU in stable condition.     Matt B. Hassell Done, [place], [facility],  [place]

## 2020-06-10 NOTE — Anesthesia Procedure Notes (Addendum)
Procedure Name: Intubation Date/Time: 06/10/2020 11:48 AM Performed by: Camarion Weier D, CRNA Pre-anesthesia Checklist: Patient identified, Emergency Drugs available, Suction available and Patient being monitored Patient Re-evaluated:Patient Re-evaluated prior to induction Oxygen Delivery Method: Circle system utilized Preoxygenation: Pre-oxygenation with 100% oxygen Induction Type: IV induction Ventilation: Mask ventilation without difficulty Laryngoscope Size: Mac and 3 Grade View: Grade I Tube type: Oral Tube size: 7.0 mm Number of attempts: 1 Airway Equipment and Method: Stylet Placement Confirmation: ETT inserted through vocal cords under direct vision,  positive ETCO2 and breath sounds checked- equal and bilateral Secured at: 21 cm Tube secured with: Tape Dental Injury: Teeth and Oropharynx as per pre-operative assessment  Comments: Intubation by EMT student Kenn File

## 2020-06-10 NOTE — Transfer of Care (Signed)
Immediate Anesthesia Transfer of Care Note  Patient: [name]  Procedure(s) Performed: LAPAROSCOPIC VENTRAL HERNIA REPAIR (N/A )  Patient Location: PACU  Anesthesia Type:General  Level of Consciousness: awake, alert  and oriented  Airway & Oxygen Therapy: Patient Spontanous Breathing and Patient connected to face mask oxygen  Post-op Assessment: Report given to RN and Post -op Vital signs reviewed and stable  Post vital signs: Reviewed and stable  Last Vitals:  Vitals Value Taken Time  BP 169/79 06/10/20 1337  Temp    Pulse 56 06/10/20 1338  Resp 11 06/10/20 1338  SpO2 100 % 06/10/20 1338  Vitals shown include unvalidated device data.  Last Pain:  Vitals:   06/10/20 1030  TempSrc:   PainSc: 0-No pain         Complications: No complications documented.

## 2020-06-10 NOTE — Anesthesia Postprocedure Evaluation (Signed)
Anesthesia Post Note  Patient: [name]  Procedure(s) Performed: LAPAROSCOPIC VENTRAL HERNIA REPAIR (N/A )     Patient location during evaluation: PACU Anesthesia Type: General Level of consciousness: awake and alert Pain management: pain level controlled Vital Signs Assessment: post-procedure vital signs reviewed and stable Respiratory status: spontaneous breathing, nonlabored ventilation, respiratory function stable and patient connected to nasal cannula oxygen Cardiovascular status: blood pressure returned to baseline and stable Postop Assessment: no apparent nausea or vomiting Anesthetic complications: no   No complications documented.  Last Vitals:  Vitals:   06/10/20 1337 06/10/20 1345  BP: (!) 169/79 (!) 148/78  Pulse: (!) 58 (!) 56  Resp: 16 17  Temp: (!) 36.3 C   SpO2: 100% 100%    Last Pain:  Vitals:   06/10/20 1345  TempSrc:   PainSc: 0-No pain                 Barnet Glasgow

## 2020-06-10 NOTE — Interval H&P Note (Signed)
History and Physical Interval Note:  06/10/2020 11:04 AM  [name]  has presented today for surgery, with the diagnosis of ventral hernia.  The various methods of treatment have been discussed with the patient and family. After consideration of risks, benefits and other options for treatment, the patient has consented to  Procedure(s) with comments: [place] (N/A) - 1.5 HOUR CASE PLEASE as a surgical intervention.  The patient's history has been reviewed, patient examined, no change in status, stable for surgery.  I have reviewed the patient's chart and labs.  Questions were answered to the patient's satisfaction.     Pedro Earls

## 2020-06-10 NOTE — Discharge Instructions (Signed)
Ventral Hernia  A ventral hernia is a bulge of tissue from inside the abdomen that pushes through a weak area of the muscles that form the front wall of the abdomen. The tissues inside the abdomen are inside a sac (peritoneum). These tissues include the small intestine, large intestine, and the fatty tissue that covers the intestines (omentum). Sometimes, the bulge that forms a hernia contains intestines. Other hernias contain only fat. Ventral hernias do not go away without surgical treatment. There are several types of ventral hernias. You may have:  A hernia at an incision site from previous abdominal surgery (incisional hernia).  A hernia just above the belly button (epigastric hernia), or at the belly button (umbilical hernia). These types of hernias can develop from heavy lifting or straining.  A hernia that comes and goes (reducible hernia). It may be visible only when you lift or strain. This type of hernia can be pushed back into the abdomen (reduced).  A hernia that traps abdominal tissue inside the hernia (incarcerated hernia). This type of hernia does not reduce.  A hernia that cuts off blood flow to the tissues inside the hernia (strangulated hernia). The tissues can start to die if this happens. This is a very painful bulge that cannot be reduced. A strangulated hernia is a medical emergency. What are the causes? This condition is caused by abdominal tissue putting pressure on an area of weakness in the abdominal muscles. What increases the risk? The following factors may make you more likely to develop this condition:  Being age 26 or older.  Being overweight or obese.  Having had previous abdominal surgery, especially if there was an infection after surgery.  Having had an injury to the abdominal wall.  Frequently lifting or pushing heavy objects.  Having had several pregnancies.  Having a buildup of fluid inside the abdomen (ascites).  Straining to have a bowel  movement or to urinate.  Having frequent coughing episodes. What are the signs or symptoms? The only symptom of a ventral hernia may be a painless bulge in the abdomen. A reducible hernia may be visible only when you strain, cough, or lift. Other symptoms may include:  Dull pain.  A feeling of pressure. Signs and symptoms of a strangulated hernia may include:  Increasing pain.  Nausea and vomiting.  Pain when pressing on the hernia.  The skin over the hernia turning red or purple.  Constipation.  Blood in the stool (feces). How is this diagnosed? This condition may be diagnosed based on:  Your symptoms.  Your medical history.  A physical exam. You may be asked to cough or strain while standing. These actions increase the pressure inside your abdomen and force the hernia through the opening in your muscles. Your health care provider may try to reduce the hernia by gently pushing the hernia back in.  Imaging studies, such as an ultrasound or CT scan. How is this treated? This condition is treated with surgery. If you have a strangulated hernia, surgery is done as soon as possible. If your hernia is small and not incarcerated, you may be asked to lose some weight before surgery. Follow these instructions at home:  Follow instructions from your health care provider about eating or drinking restrictions.  If you are overweight, your health care provider may recommend that you increase your activity level and eat a healthier diet.  Do not lift anything that is heavier than 10 lb (4.5 kg), or the limit that you are told,  until your health care provider says that it is safe.  Return to your normal activities as told by your health care provider. Ask your health care provider what activities are safe for you. You may need to avoid activities that increase pressure on your hernia.  Take over-the-counter and prescription medicines only as told by your health care provider.  Keep  all follow-up visits. This is important. Contact a health care provider if:  Your hernia gets larger.  Your hernia becomes painful. Get help right away if:  Your hernia becomes increasingly painful.  You have pain along with any of the following: ? Changes in skin color in the area of the hernia. ? Nausea. ? Vomiting. ? Fever. These symptoms may represent a serious problem that is an emergency. Do not wait to see if the symptoms will go away. Get medical help right away. Call your local emergency services (911 in the [place] Do not drive yourself to the hospital. Summary  A ventral hernia is a bulge of tissue from inside the abdomen that pushes through a weak area of the muscles that form the front wall of the abdomen.  This condition is treated with surgery, which may be urgent depending on your hernia.  Do not lift anything that is heavier than 10 lb (4.5 kg), and follow activity instructions from your health care provider. This information is not intended to replace advice given to you by your health care provider. Make sure you discuss any questions you have with your health care provider. Document Revised: 10/20/2019 Document Reviewed: 10/20/2019 Elsevier Patient Education  2021 [place] [facility], Adult, Care After This sheet gives you information about how to care for yourself after your procedure. Your health care provider may also give you more specific instructions. If you have problems or questions, contact your health care provider. What can I expect after the procedure? After the procedure, the following side effects are common:  Pain or discomfort at the IV site.  Nausea.  Vomiting.  Sore throat.  Trouble concentrating.  Feeling cold or chills.  Feeling weak or tired.  Sleepiness and fatigue.  Soreness and body aches. These side effects can affect parts of the body that were not involved in surgery. Follow these instructions at  home: For the time period you were told by your health care provider:  Rest.  Do not participate in activities where you could fall or become injured.  Do not drive or use machinery.  Do not drink alcohol.  Do not take sleeping pills or medicines that cause drowsiness.  Do not make important decisions or sign legal documents.  Do not take care of children on your own.   Eating and drinking  Follow any instructions from your health care provider about eating or drinking restrictions.  When you feel hungry, start by eating small amounts of foods that are soft and easy to digest (bland), such as toast. Gradually return to your regular diet.  Drink enough fluid to keep your urine pale yellow.  If you vomit, rehydrate by drinking water, juice, or clear broth. General instructions  If you have sleep apnea, surgery and certain medicines can increase your risk for breathing problems. Follow instructions from your health care provider about wearing your sleep device: ? Anytime you are sleeping, including during daytime naps. ? While taking prescription pain medicines, sleeping medicines, or medicines that make you drowsy.  Have a responsible adult stay with you for the time you are  told. It is important to have someone help care for you until you are awake and alert.  Return to your normal activities as told by your health care provider. Ask your health care provider what activities are safe for you.  Take over-the-counter and prescription medicines only as told by your health care provider.  If you smoke, do not smoke without supervision.  Keep all follow-up visits as told by your health care provider. This is important. Contact a health care provider if:  You have nausea or vomiting that does not get better with medicine.  You cannot eat or drink without vomiting.  You have pain that does not get better with medicine.  You are unable to pass urine.  You develop a skin  rash.  You have a fever.  You have redness around your IV site that gets worse. Get help right away if:  You have difficulty breathing.  You have chest pain.  You have blood in your urine or stool, or you vomit blood. Summary  After the procedure, it is common to have a sore throat or nausea. It is also common to feel tired.  Have a responsible adult stay with you for the time you are told. It is important to have someone help care for you until you are awake and alert.  When you feel hungry, start by eating small amounts of foods that are soft and easy to digest (bland), such as toast. Gradually return to your regular diet.  Drink enough fluid to keep your urine pale yellow.  Return to your normal activities as told by your health care provider. Ask your health care provider what activities are safe for you. This information is not intended to replace advice given to you by your health care provider. Make sure you discuss any questions you have with your health care provider. Document Revised: 11/16/2019 Document Reviewed: 06/15/2019 Elsevier Patient Education  2021 [employer].

## 2020-06-11 ENCOUNTER — Encounter (HOSPITAL_COMMUNITY): Payer: Self-pay | Admitting: Surgery

## 2020-06-14 ENCOUNTER — Other Ambulatory Visit (HOSPITAL_COMMUNITY): Payer: Medicare Other

## 2020-06-18 ENCOUNTER — Other Ambulatory Visit (HOSPITAL_COMMUNITY): Payer: Medicare Other

## 2020-06-25 ENCOUNTER — Other Ambulatory Visit: Payer: Self-pay | Admitting: Internal Medicine

## 2020-07-31 ENCOUNTER — Other Ambulatory Visit: Payer: Self-pay | Admitting: Internal Medicine

## 2020-08-01 DIAGNOSIS — N649 Disorder of breast, unspecified: Secondary | ICD-10-CM | POA: Diagnosis not present

## 2020-08-01 DIAGNOSIS — Z1231 Encounter for screening mammogram for malignant neoplasm of breast: Secondary | ICD-10-CM | POA: Diagnosis not present

## 2020-08-03 ENCOUNTER — Other Ambulatory Visit: Payer: Self-pay | Admitting: Internal Medicine

## 2020-09-02 ENCOUNTER — Other Ambulatory Visit: Payer: Self-pay | Admitting: Internal Medicine

## 2020-09-03 ENCOUNTER — Other Ambulatory Visit: Payer: Self-pay | Admitting: Internal Medicine

## 2020-09-06 ENCOUNTER — Other Ambulatory Visit: Payer: Self-pay | Admitting: Internal Medicine

## 2020-09-08 ENCOUNTER — Other Ambulatory Visit: Payer: Self-pay | Admitting: Internal Medicine

## 2020-10-07 DIAGNOSIS — H401131 Primary open-angle glaucoma, bilateral, mild stage: Secondary | ICD-10-CM | POA: Diagnosis not present

## 2020-10-15 ENCOUNTER — Other Ambulatory Visit: Payer: Self-pay | Admitting: Internal Medicine

## 2020-11-01 ENCOUNTER — Other Ambulatory Visit: Payer: Self-pay | Admitting: Internal Medicine

## 2020-11-25 ENCOUNTER — Telehealth: Payer: Self-pay

## 2020-11-25 MED ORDER — HYDROCODONE BIT-HOMATROP MBR 5-1.5 MG/5ML PO SOLN: 5.0000 mL | Freq: Four times a day (QID) | ORAL | 0 refills | Status: DC | PRN

## 2020-11-25 NOTE — Addendum Note (Signed)
Addended by: Cassandria Anger on: 11/25/2020 11:34 PM   Modules accepted: Orders

## 2020-11-25 NOTE — Telephone Encounter (Signed)
Cough syrup prescription was emailed.  Office visit if not better.  Thanks

## 2020-11-25 NOTE — Telephone Encounter (Signed)
Please advise as the pt has stated she is in need of Dr. Alain Marion to send in something for her cough due to her allergies. Pt has stated he normally send in a steroid cough medicine.

## 2020-11-26 NOTE — Telephone Encounter (Signed)
Notified pt sent rx to [facility] Kitchen[facility]y Chess

## 2020-12-01 ENCOUNTER — Other Ambulatory Visit: Payer: Self-pay | Admitting: Internal Medicine

## 2020-12-02 ENCOUNTER — Other Ambulatory Visit: Payer: Self-pay | Admitting: Internal Medicine

## 2020-12-13 ENCOUNTER — Encounter: Payer: Medicare Other | Admitting: Internal Medicine

## 2020-12-13 ENCOUNTER — Ambulatory Visit: Payer: Medicare Other

## 2020-12-31 ENCOUNTER — Other Ambulatory Visit: Payer: Self-pay

## 2020-12-31 ENCOUNTER — Ambulatory Visit (INDEPENDENT_AMBULATORY_CARE_PROVIDER_SITE_OTHER): Payer: Medicare Other | Admitting: Internal Medicine

## 2020-12-31 ENCOUNTER — Encounter: Payer: Self-pay | Admitting: Internal Medicine

## 2020-12-31 ENCOUNTER — Ambulatory Visit (INDEPENDENT_AMBULATORY_CARE_PROVIDER_SITE_OTHER): Payer: Medicare Other

## 2020-12-31 VITALS — BP 160/80 | HR 62 | Temp 98.0°F | Ht 60.0 in | Wt 162.8 lb

## 2020-12-31 DIAGNOSIS — I10 Essential (primary) hypertension: Secondary | ICD-10-CM | POA: Diagnosis not present

## 2020-12-31 DIAGNOSIS — Z0000 Encounter for general adult medical examination without abnormal findings: Secondary | ICD-10-CM

## 2020-12-31 DIAGNOSIS — E559 Vitamin D deficiency, unspecified: Secondary | ICD-10-CM | POA: Diagnosis not present

## 2020-12-31 DIAGNOSIS — E785 Hyperlipidemia, unspecified: Secondary | ICD-10-CM

## 2020-12-31 DIAGNOSIS — Z23 Encounter for immunization: Secondary | ICD-10-CM

## 2020-12-31 DIAGNOSIS — M1990 Unspecified osteoarthritis, unspecified site: Secondary | ICD-10-CM | POA: Diagnosis not present

## 2020-12-31 LAB — URINALYSIS, ROUTINE W REFLEX MICROSCOPIC
Bilirubin Urine: NEGATIVE
Hgb urine dipstick: NEGATIVE
Ketones, ur: NEGATIVE
Nitrite: NEGATIVE
RBC / HPF: NONE SEEN (ref 0–?)
Specific Gravity, Urine: <=1.005 — AB (ref 1.000–1.030)
Total Protein, Urine: NEGATIVE
Urine Glucose: NEGATIVE
Urobilinogen, UA: 0.2 (ref 0.0–1.0)
pH: 6 (ref 5.0–8.0)

## 2020-12-31 LAB — COMPREHENSIVE METABOLIC PANEL
ALT: 8 U/L (ref 0–35)
AST: 15 U/L (ref 0–37)
Albumin: 4.2 g/dL (ref 3.5–5.2)
Alkaline Phosphatase: 86 U/L (ref 39–117)
BUN: 12 mg/dL (ref 6–23)
CO2: 27 mEq/L (ref 19–32)
Calcium: 9.7 mg/dL (ref 8.4–10.5)
Chloride: 107 mEq/L (ref 96–112)
Creatinine, Ser: 0.95 mg/dL (ref 0.40–1.20)
GFR: 54.75 mL/min — ABNORMAL LOW (ref >60.00)
Glucose, Bld: 89 mg/dL (ref 70–99)
Potassium: 3.9 mEq/L (ref 3.5–5.1)
Sodium: 143 mEq/L (ref 135–145)
Total Bilirubin: 0.6 mg/dL (ref 0.2–1.2)
Total Protein: 7.4 g/dL (ref 6.0–8.3)

## 2020-12-31 LAB — CBC WITH DIFFERENTIAL/PLATELET
Basophils Absolute: 0.1 10*3/uL (ref 0.0–0.1)
Basophils Relative: 0.9 % (ref 0.0–3.0)
Eosinophils Absolute: 0.5 10*3/uL (ref 0.0–0.7)
Eosinophils Relative: 7.1 % — ABNORMAL HIGH (ref 0.0–5.0)
HCT: 39 % (ref 36.0–46.0)
Hemoglobin: 12.3 g/dL (ref 12.0–15.0)
Lymphocytes Relative: 32.7 % (ref 12.0–46.0)
Lymphs Abs: 2.3 10*3/uL (ref 0.7–4.0)
MCHC: 31.4 g/dL (ref 30.0–36.0)
MCV: 82 fl (ref 78.0–100.0)
Monocytes Absolute: 0.5 10*3/uL (ref 0.1–1.0)
Monocytes Relative: 6.6 % (ref 3.0–12.0)
Neutro Abs: 3.7 10*3/uL (ref 1.4–7.7)
Neutrophils Relative %: 52.7 % (ref 43.0–77.0)
Platelets: 275 10*3/uL (ref 150.0–400.0)
RBC: 4.76 Mil/uL (ref 3.87–5.11)
RDW: 16 % — ABNORMAL HIGH (ref 11.5–15.5)
WBC: 7 10*3/uL (ref 4.0–10.5)

## 2020-12-31 LAB — LIPID PANEL
Cholesterol: 177 mg/dL (ref 0–200)
HDL: 62.1 mg/dL (ref >39.00)
LDL Cholesterol: 99 mg/dL (ref 0–99)
NonHDL: 115.25
Total CHOL/HDL Ratio: 3
Triglycerides: 81 mg/dL (ref 0.0–149.0)
VLDL: 16.2 mg/dL (ref 0.0–40.0)

## 2020-12-31 LAB — TSH: TSH: 2.58 u[IU]/mL (ref 0.35–5.50)

## 2020-12-31 LAB — VITAMIN D 25 HYDROXY (VIT D DEFICIENCY, FRACTURES): VITD: 35.84 ng/mL (ref 30.00–100.00)

## 2020-12-31 NOTE — Assessment & Plan Note (Signed)
Cont on Losartan, Bystolic, Furosemide, Amlodipine 5 mg Check BP at home - normal.

## 2020-12-31 NOTE — Assessment & Plan Note (Addendum)
Vit D  Check Vit D

## 2020-12-31 NOTE — Assessment & Plan Note (Signed)
Voltaren gel 

## 2020-12-31 NOTE — Progress Notes (Addendum)
Subjective:   [name] is a 87 y.o. female who presents for Medicare Annual (Subsequent) preventive examination.  Review of Systems     Cardiac Risk Factors include: advanced age (>7men, >40 women);dyslipidemia;family history of premature cardiovascular disease;hypertension;obesity (BMI >30kg/m2)     Objective:    Today's Vitals   12/31/20 0910  BP: (!) 160/80  Pulse: 62  Temp: 98 F (36.7 C)  SpO2: 95%  Weight: 162 lb 12.8 oz (73.8 kg)  Height: 5' (1.524 m)  PainSc: 0-No pain   Body mass index is 31.79 kg/m.  Advanced Directives 12/31/2020 06/06/2020 08/31/2019 03/28/2018 09/02/2016  Does Patient Have a Medical Advance Directive? Yes No No No Yes  Type of Advance Directive Living will - - - [place];Living will  Does patient want to make changes to medical advance directive? No - Patient declined - No - Patient declined Yes (ED - Information included in AVS) -  Copy of [place] in Chart? - - - - No - copy requested    Current Medications (verified) Outpatient Encounter Medications as of 12/31/2020  Medication Sig   acetaminophen (TYLENOL) 325 MG tablet Take 650 mg by mouth every 6 (six) hours as needed for moderate pain.   amLODipine (NORVASC) 5 MG tablet TAKE 1 TABLET(5 MG) BY MOUTH DAILY   ATROVENT HFA 17 MCG/ACT inhaler Inhale 2 puffs into the lungs in the morning.   BREO ELLIPTA 200-25 MCG/INH AEPB Inhale 1 puff into the lungs every evening.   cloNIDine (CATAPRES) 0.1 MG tablet TAKE 1 TABLET BY MOUTH TWICE DAILY AS NEEDED FOR TOP BLOOD PRESSURE GREATER THAN 180   desloratadine (CLARINEX) 5 MG tablet Take 5 mg by mouth daily.   diclofenac Sodium (VOLTAREN) 1 % GEL Apply 4 g topically 4 (four) times daily. (Patient not taking: No sig reported)   ezetimibe (ZETIA) 10 MG tablet TAKE 1 TABLET BY MOUTH DAILY   furosemide (LASIX) 40 MG tablet Take 1 tablet (40 mg total) by mouth daily. Overdue for Annual appt must see provider  for future refills   HYDROcodone-acetaminophen (NORCO/VICODIN) 5-325 MG tablet Take 1 tablet by mouth every 6 (six) hours as needed for moderate pain.   ipratropium (ATROVENT) 0.06 % nasal spray Place 2 sprays into both nostrils as needed for rhinitis.   latanoprost (XALATAN) 0.005 % ophthalmic solution Place 1 drop into both eyes at bedtime.   losartan (COZAAR) 100 MG tablet TAKE 1 TABLET BY MOUTH EVERY DAY (Patient taking differently: Take 100 mg by mouth daily.)   Multiple Vitamins-Minerals (MULTIVITAMIN WITH MINERALS) tablet Take 1 tablet by mouth daily.   Nebivolol HCl (BYSTOLIC) 20 MG TABS TAKE 1 TABLET BY MOUTH EVERY DAY   omeprazole (PRILOSEC) 40 MG capsule TAKE 1 CAPSULE BY MOUTH EVERY DAY   prednisoLONE acetate (PRED FORTE) 1 % ophthalmic suspension Place 1 drop into the right eye daily.   sucralfate (CARAFATE) 1 g tablet TAKE 1 TABLET(1 GRAM) BY MOUTH FOUR TIMES DAILY AT BEDTIME WITH MEALS (Patient taking differently: Take 1 g by mouth 4 (four) times daily -  with meals and at bedtime.)   [DISCONTINUED] diclofenac sodium (VOLTAREN) 1 % GEL apply 4 grams to affected area four times a day (Patient not taking: No sig reported)   [DISCONTINUED] fexofenadine (ALLEGRA) 180 MG tablet Take 180 mg by mouth daily. (Patient not taking: No sig reported)   [DISCONTINUED] HYDROcodone bit-homatropine (HYCODAN) 5-1.5 MG/5ML syrup Take 5 mLs by mouth every 6 (six) hours  as needed for cough. (Patient not taking: Reported on 12/31/2020)   [DISCONTINUED] Vitamin D, Ergocalciferol, (DRISDOL) 1.25 MG (50000 UNIT) CAPS capsule Take 1 capsule (50,000 Units total) by mouth every 7 (seven) days. (Patient not taking: No sig reported)   No facility-administered encounter medications on file as of 12/31/2020.    Allergies (verified) Budesonide-formoterol fumarate, Codeine, Coreg [carvedilol], Guaifenesin, Indapamide, and Singulair [montelukast sodium]   History: Past Medical History:  Diagnosis Date   Allergy     Allergy, unspecified not elsewhere classified    Anxiety    pt denies    Asthma    Cataract    bil cataracts removed   Diverticulitis    Diverticulosis of colon (without mention of hemorrhage)    DJD (degenerative joint disease)    Gastritis    GERD (gastroesophageal reflux disease)    Glaucoma    History of GI diverticular bleed    HTN (hypertension)    Hyperlipidemia    LPRD (laryngopharyngeal reflux disease)    Past Surgical History:  Procedure Laterality Date   BLADDER REPAIR     after perforation   cataract surgery  2011   w/ IOL Gershon Crane)   COLONOSCOPY     KIDNEY SURGERY     KNEE ARTHROSCOPY  1998   left   UPPER GASTROINTESTINAL ENDOSCOPY     VENTRAL HERNIA REPAIR N/A 06/10/2020   Procedure: LAPAROSCOPIC VENTRAL HERNIA REPAIR;  Surgeon: Johnathan Hausen, MD;  Location: [facility];  Service: General;  Laterality: N/A;  1.5 HOUR CASE PLEASE   WISDOM TOOTH EXTRACTION     Family History  Problem Relation Age of Onset   Heart disease Father        ??   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 Married '56 - '87, widowed. 2 sons > '53, '62. 2 daughters > '59, '61. 9 granddaughters. 12  great-grandchildren. Lives in own home and son lives with her.  Retired.   ACP - asked but not answered (June '14). Referred to [email or web address].   Social Determinants of Health   Financial Resource Strain: Low Risk    Difficulty of Paying Living Expenses: Not hard at all  Food Insecurity: No Food Insecurity   Worried About Charity fundraiser in the Last Year: Never true   [place] in the Last Year: Never true  Transportation Needs: No Transportation Needs   Lack of Transportation (Medical): No   Lack  of Transportation (Non-Medical): No  Physical Activity: Sufficiently Active   Days of Exercise per Week: 5 days   Minutes of Exercise per Session: 30 min  Stress: No Stress Concern Present   Feeling of Stress : Not at all  Social Connections: Moderately Integrated   Frequency of Communication with Friends and Family: More than three times a week   Frequency of Social Gatherings with Friends and Family: More than three times a week   Attends Religious Services: More than 4 times per year   Active Member of [employer] or Organizations: Yes   Attends Archivist Meetings: More than 4 times per year   Marital Status: Widowed    Tobacco Counseling Counseling given: Not Answered   Clinical Intake:  Pre-visit preparation completed: Yes  Pain : No/denies pain Pain Score: 0-No pain     BMI - recorded: 31.79 Nutritional Status: BMI > 30  Obese Nutritional Risks: None Diabetes: No  How often do you need to have someone help you when you read instructions, pamphlets, or other written materials from your doctor or pharmacy?: 1 - Never What is the last grade level you completed in school?: HSG  Diabetic? no  Interpreter Needed?: No  Information entered by :: Lisette Abu, LPN   Activities of Daily Living In your present state of health, do you have any difficulty performing the following activities: 12/31/2020 06/06/2020  Hearing? N N  Vision? N N   Difficulty concentrating or making decisions? N N  Walking or climbing stairs? N N  Dressing or bathing? N -  Doing errands, shopping? N N  Preparing Food and eating ? N -  Using the Toilet? N -  In the past six months, have you accidently leaked urine? N -  Do you have problems with loss of bowel control? N -  Managing your Medications? N -  Managing your Finances? N -  Housekeeping or managing your Housekeeping? N -  Some recent data might be hidden    Patient Care Team: Plotnikov, Evie Lacks, MD as PCP - General (Internal Medicine) Gatha Mayer, MD as Consulting Physician (Gastroenterology) Rutherford Guys, MD as Consulting Physician (Ophthalmology)  Indicate any recent Medical Services you may have received from other than [facility] providers in the past year (date may be approximate).     Assessment:   This is a routine wellness examination for [name].  Hearing/Vision screen Hearing Screening - Comments:: Patient denied any hearing difficulty.   No hearing aids.  Vision Screening - Comments:: Patient wears corrective glasses/contacts.  Eye exam done annually by: Rutherford Guys, MD.  Dietary issues and exercise activities discussed: Current Exercise Habits: Home exercise routine, Type of exercise: walking, Time (Minutes): 30, Frequency (Times/Week): 5, Weekly Exercise (Minutes/Week): 150, Intensity: Mild, Exercise limited by: orthopedic condition(s)   Goals Addressed               This Visit's Progress     Patient Stated (pt-stated)        To maintain my current health status by continuing to eat healthy, stay physically active and socially active.      Depression Screen PHQ 2/9 Scores 12/31/2020 08/31/2019 03/28/2018 12/21/2017 09/02/2016 06/04/2015 05/30/2014  PHQ - 2 Score 0 0 0 0 0 0 0    Fall Risk Fall Risk  12/31/2020 12/31/2020 08/31/2019 03/28/2018 02/21/2018  Falls in the past year? 0 1 0 0 0  Comment - - - - -  Number falls in past yr: 0 0  0 - 0  Injury with  Fall? 0 0 0 - 0  Risk for fall due to : No Fall Risks History of fall(s);Impaired balance/gait No Fall Risks Impaired balance/gait -  Follow up Falls evaluation completed - Falls evaluation completed Falls prevention discussed Falls evaluation completed    FALL RISK PREVENTION PERTAINING TO THE HOME:  Any stairs in or around the home? No  If so, are there any without handrails? No  Home free of loose throw rugs in walkways, pet beds, electrical cords, etc? Yes  Adequate lighting in your home to reduce risk of falls? Yes   ASSISTIVE DEVICES UTILIZED TO PREVENT FALLS:  Life alert? No  Use of a cane, walker or w/c? No  Grab bars in the bathroom? Yes  Shower chair or bench in shower? No  Elevated toilet seat or a handicapped toilet? Yes   TIMED UP AND GO:  Was the test performed? Yes .  Length of time to ambulate 10 feet: 7 sec.   Gait steady and fast without use of assistive device  Cognitive Function: Normal cognitive status assessed by direct observation by this Nurse Health Advisor. No abnormalities found.   MMSE - Mini Mental State Exam 08/31/2019 09/02/2016  Not completed: Refused -  Orientation to time - 5  Orientation to Place - 5  Registration - 3  Attention/ Calculation - 5  Recall - 2  Language- name 2 objects - 2  Language- repeat - 1  Language- follow 3 step command - 3  Language- read & follow direction - 1  Write a sentence - 1  Copy design - 1  Total score - 29        Immunizations Immunization History  Administered Date(s) Administered   Fluad Quad(high Dose 65+) 11/19/2018, 12/18/2019   Influenza Split 11/26/2010, 01/25/2012   Influenza Whole 12/12/2007, 01/28/2009, 11/14/2009   Influenza, High Dose Seasonal PF 12/19/2012, 01/09/2016, 01/06/2017, 12/21/2017, 05/16/2020   Influenza,inj,Quad PF,6+ Mos 12/06/2014   Influenza-Unspecified 12/23/2013   PFIZER(Purple Top)SARS-COV-2 Vaccination 05/11/2019, 06/08/2019, 11/02/2019, 05/16/2020   Pneumococcal  Conjugate-13 05/30/2014   Pneumococcal Polysaccharide-23 06/17/2002, 09/05/2015   Td 08/20/2011   Zoster, Live 10/13/2011, 09/29/2012    TDAP status: Up to date  Flu Vaccine status: Completed at today's visit  Pneumococcal vaccine status: Up to date  Covid-19 vaccine status: Completed vaccines  Qualifies for Shingles Vaccine? Yes   Zostavax completed Yes   Shingrix Completed?: No.    Education has been provided regarding the importance of this vaccine. Patient has been advised to call insurance company to determine out of pocket expense if they have not yet received this vaccine. Advised may also receive vaccine at local pharmacy or Health Dept. Verbalized acceptance and understanding.  Screening Tests Health Maintenance  Topic Date Due   Zoster Vaccines- Shingrix (1 of 2) Never done   INFLUENZA VACCINE  10/14/2020   TETANUS/TDAP  08/19/2021   DEXA SCAN  12/08/2021   COVID-19 Vaccine  Completed   HPV VACCINES  Aged Out    Health Maintenance  Health Maintenance Due  Topic Date Due   Zoster Vaccines- Shingrix (1 of 2) Never done   INFLUENZA VACCINE  10/14/2020    Colorectal cancer screening: No longer required.   Mammogram status: No longer required due to age.  Bone Density status: Completed 12/08/2016. Results reflect: Bone density results: NORMAL. Repeat every 5 years.  Lung Cancer Screening: (Low Dose CT Chest recommended if Age 77-80 years, 30 pack-year currently smoking OR have  quit w/in 15years.) does not qualify.   Lung Cancer Screening Referral: no  Additional Screening:  Hepatitis C Screening: does not qualify; Completed no  Vision Screening: Recommended annual ophthalmology exams for early detection of glaucoma and other disorders of the eye. Is the patient up to date with their annual eye exam?  Yes  Who is the provider or what is the name of the office in which the patient attends annual eye exams? Rutherford Guys, MD. If pt is not established with a  provider, would they like to be referred to a provider to establish care? No .   Dental Screening: Recommended annual dental exams for proper oral hygiene  Community Resource Referral / Chronic Care Management: CRR required this visit?  No   CCM required this visit?  No      Plan:     I have personally reviewed and noted the following in the patient's chart:   Medical and social history Use of alcohol, tobacco or illicit drugs  Current medications and supplements including opioid prescriptions.  Functional ability and status Nutritional status Physical activity Advanced directives List of other physicians Hospitalizations, surgeries, and ER visits in previous 12 months Vitals Screenings to include cognitive, depression, and falls Referrals and appointments  In addition, I have reviewed and discussed with patient certain preventive protocols, quality metrics, and best practice recommendations. A written personalized care plan for preventive services as well as general preventive health recommendations were provided to patient.     Sheral Flow, LPN   01/16/1593   Nurse Notes:  Hearing Screening - Comments:: Patient denied any hearing difficulty.   No hearing aids.  Vision Screening - Comments:: Patient wears corrective glasses/contacts.  Eye exam done annually by: Rutherford Guys, MD.   Medical screening examination/treatment/procedure(s) were performed by non-physician practitioner and as supervising physician I was immediately available for consultation/collaboration.  I agree with above. Lew Dawes, MD

## 2020-12-31 NOTE — Assessment & Plan Note (Signed)
On Zetia 

## 2020-12-31 NOTE — Patient Instructions (Addendum)
[name] , Thank you for taking time to come for your Medicare Wellness Visit. I appreciate your ongoing commitment to your health goals. Please review the following plan we discussed and let me know if I can assist you in the future.   Screening recommendations/referrals: Colonoscopy: Not a candidate for screening due to age 87: Not a candidate for screening due to age Bone Density: Last done 12/08/2016; due every 5 years (due 12/08/2021) Recommended yearly ophthalmology/optometry visit for glaucoma screening and checkup Recommended yearly dental visit for hygiene and checkup  Vaccinations: Influenza vaccine: 12/31/2020 Pneumococcal vaccine: 05/30/2014, 09/05/2015 Tdap vaccine: 08/20/2011; due every 10 years (due 08/19/2021) Shingles vaccine: never done; check with local pharmacy   Covid-19: 05/11/2019, 06/08/2019  Advanced directives: Please bring a copy of your health care power of attorney and living will to the office at your convenience.  Conditions/risks identified: Yes; Client understands the importance of follow-up with providers by attending scheduled visits and discussed goals to eat healthier, increase physical activity, exercise the brain, socialize more, get enough sleep and make time for laughter.  Next appointment: Please schedule your next Medicare Wellness Visit with your Nurse Health Advisor in 1 year by calling [phone].   Preventive Care 85 Years and Older, Female Preventive care refers to lifestyle choices and visits with your health care provider that can promote health and wellness. What does preventive care include? A yearly physical exam. This is also called an annual well check. Dental exams once or twice a year. Routine eye exams. Ask your health care provider how often you should have your eyes checked. Personal lifestyle choices, including: Daily care of your teeth and gums. Regular physical activity. Eating a healthy diet. Avoiding tobacco and drug  use. Limiting alcohol use. Practicing safe sex. Taking low-dose aspirin every day. Taking vitamin and mineral supplements as recommended by your health care provider. What happens during an annual well check? The services and screenings done by your health care provider during your annual well check will depend on your age, overall health, lifestyle risk factors, and family history of disease. Counseling  Your health care provider may ask you questions about your: Alcohol use. Tobacco use. Drug use. Emotional well-being. Home and relationship well-being. Sexual activity. Eating habits. History of falls. Memory and ability to understand (cognition). Work and work Statistician. Reproductive health. Screening  You may have the following tests or measurements: Height, weight, and BMI. Blood pressure. Lipid and cholesterol levels. These may be checked every 5 years, or more frequently if you are over 49 years old. Skin check. Lung cancer screening. You may have this screening every year starting at age 52 if you have a 30-pack-year history of smoking and currently smoke or have quit within the past 15 years. Fecal occult blood test (FOBT) of the stool. You may have this test every year starting at age 61. Flexible sigmoidoscopy or colonoscopy. You may have a sigmoidoscopy every 5 years or a colonoscopy every 10 years starting at age 84. Hepatitis C blood test. Hepatitis B blood test. Sexually transmitted disease (STD) testing. Diabetes screening. This is done by checking your blood sugar (glucose) after you have not eaten for a while (fasting). You may have this done every 1-3 years. Bone density scan. This is done to screen for osteoporosis. You may have this done starting at age 31. Mammogram. This may be done every 1-2 years. Talk to your health care provider about how often you should have regular mammograms. Talk with your health  care provider about your test results, treatment  options, and if necessary, the need for more tests. Vaccines  Your health care provider may recommend certain vaccines, such as: Influenza vaccine. This is recommended every year. Tetanus, diphtheria, and acellular pertussis (Tdap, Td) vaccine. You may need a Td booster every 10 years. Zoster vaccine. You may need this after age 34. Pneumococcal 13-valent conjugate (PCV13) vaccine. One dose is recommended after age 35. Pneumococcal polysaccharide (PPSV23) vaccine. One dose is recommended after age 67. Talk to your health care provider about which screenings and vaccines you need and how often you need them. This information is not intended to replace advice given to you by your health care provider. Make sure you discuss any questions you have with your health care provider. Document Released: 03/29/2015 Document Revised: 11/20/2015 Document Reviewed: 01/01/2015 Elsevier Interactive Patient Education  2017 [place] Prevention in the Home Falls can cause injuries. They can happen to people of all ages. There are many things you can do to make your home safe and to help prevent falls. What can I do on the outside of my home? Regularly fix the edges of walkways and driveways and fix any cracks. Remove anything that might make you trip as you walk through a door, such as a raised step or threshold. Trim any bushes or trees on the path to your home. Use bright outdoor lighting. Clear any walking paths of anything that might make someone trip, such as rocks or tools. Regularly check to see if handrails are loose or broken. Make sure that both sides of any steps have handrails. Any raised decks and porches should have guardrails on the edges. Have any leaves, snow, or ice cleared regularly. Use sand or salt on walking paths during winter. Clean up any spills in your garage right away. This includes oil or grease spills. What can I do in the bathroom? Use night lights. Install grab  bars by the toilet and in the tub and shower. Do not use towel bars as grab bars. Use non-skid mats or decals in the tub or shower. If you need to sit down in the shower, use a plastic, non-slip stool. Keep the floor dry. Clean up any water that spills on the floor as soon as it happens. Remove soap buildup in the tub or shower regularly. Attach bath mats securely with double-sided non-slip rug tape. Do not have throw rugs and other things on the floor that can make you trip. What can I do in the bedroom? Use night lights. Make sure that you have a light by your bed that is easy to reach. Do not use any sheets or blankets that are too big for your bed. They should not hang down onto the floor. Have a firm chair that has side arms. You can use this for support while you get dressed. Do not have throw rugs and other things on the floor that can make you trip. What can I do in the kitchen? Clean up any spills right away. Avoid walking on wet floors. Keep items that you use a lot in easy-to-reach places. If you need to reach something above you, use a strong step stool that has a grab bar. Keep electrical cords out of the way. Do not use floor polish or wax that makes floors slippery. If you must use wax, use non-skid floor wax. Do not have throw rugs and other things on the floor that can make you trip. What can  I do with my stairs? Do not leave any items on the stairs. Make sure that there are handrails on both sides of the stairs and use them. Fix handrails that are broken or loose. Make sure that handrails are as long as the stairways. Check any carpeting to make sure that it is firmly attached to the stairs. Fix any carpet that is loose or worn. Avoid having throw rugs at the top or bottom of the stairs. If you do have throw rugs, attach them to the floor with carpet tape. Make sure that you have a light switch at the top of the stairs and the bottom of the stairs. If you do not have them,  ask someone to add them for you. What else can I do to help prevent falls? Wear shoes that: Do not have high heels. Have rubber bottoms. Are comfortable and fit you well. Are closed at the toe. Do not wear sandals. If you use a stepladder: Make sure that it is fully opened. Do not climb a closed stepladder. Make sure that both sides of the stepladder are locked into place. Ask someone to hold it for you, if possible. Clearly mark and make sure that you can see: Any grab bars or handrails. First and last steps. Where the edge of each step is. Use tools that help you move around (mobility aids) if they are needed. These include: Canes. Walkers. Scooters. Crutches. Turn on the lights when you go into a dark area. Replace any light bulbs as soon as they burn out. Set up your furniture so you have a clear path. Avoid moving your furniture around. If any of your floors are uneven, fix them. If there are any pets around you, be aware of where they are. Review your medicines with your doctor. Some medicines can make you feel dizzy. This can increase your chance of falling. Ask your doctor what other things that you can do to help prevent falls. This information is not intended to replace advice given to you by your health care provider. Make sure you discuss any questions you have with your health care provider. Document Released: 12/27/2008 Document Revised: 08/08/2015 Document Reviewed: 04/06/2014 Elsevier Interactive Patient Education  2017 [employer].

## 2020-12-31 NOTE — Progress Notes (Signed)
Subjective:  Patient ID: [name], female    DOB: Jan 07, 1934  Age: 87 y.o. MRN: [id number]  CC: Annual Exam   HPI JAYDELYN VEIL presents for HTN, OA, dyslipidemia BP nl at home  Outpatient Medications Prior to Visit  Medication Sig Dispense Refill   acetaminophen (TYLENOL) 325 MG tablet Take 650 mg by mouth every 6 (six) hours as needed for moderate pain.     amLODipine (NORVASC) 5 MG tablet TAKE 1 TABLET(5 MG) BY MOUTH DAILY 90 tablet 3   ATROVENT HFA 17 MCG/ACT inhaler Inhale 2 puffs into the lungs in the morning.  5   BREO ELLIPTA 200-25 MCG/INH AEPB Inhale 1 puff into the lungs every evening.  5   cloNIDine (CATAPRES) 0.1 MG tablet TAKE 1 TABLET BY MOUTH TWICE DAILY AS NEEDED FOR TOP BLOOD PRESSURE GREATER THAN 180 60 tablet 0   desloratadine (CLARINEX) 5 MG tablet Take 5 mg by mouth daily.     diclofenac Sodium (VOLTAREN) 1 % GEL Apply 4 g topically 4 (four) times daily. (Patient not taking: No sig reported) 200 g 3   ezetimibe (ZETIA) 10 MG tablet TAKE 1 TABLET BY MOUTH DAILY 30 tablet 11   furosemide (LASIX) 40 MG tablet Take 1 tablet (40 mg total) by mouth daily. Overdue for Annual appt must see provider for future refills 30 tablet 0   HYDROcodone-acetaminophen (NORCO/VICODIN) 5-325 MG tablet Take 1 tablet by mouth every 6 (six) hours as needed for moderate pain. 15 tablet 0   ipratropium (ATROVENT) 0.06 % nasal spray Place 2 sprays into both nostrils as needed for rhinitis.     latanoprost (XALATAN) 0.005 % ophthalmic solution Place 1 drop into both eyes at bedtime.  3   losartan (COZAAR) 100 MG tablet TAKE 1 TABLET BY MOUTH EVERY DAY (Patient taking differently: Take 100 mg by mouth daily.) 90 tablet 3   Multiple Vitamins-Minerals (MULTIVITAMIN WITH MINERALS) tablet Take 1 tablet by mouth daily.     Nebivolol HCl (BYSTOLIC) 20 MG TABS TAKE 1 TABLET BY MOUTH EVERY DAY 90 tablet 1   omeprazole (PRILOSEC) 40 MG capsule TAKE 1 CAPSULE BY MOUTH EVERY DAY 30 capsule 11    prednisoLONE acetate (PRED FORTE) 1 % ophthalmic suspension Place 1 drop into the right eye daily.  0   sucralfate (CARAFATE) 1 g tablet TAKE 1 TABLET(1 GRAM) BY MOUTH FOUR TIMES DAILY AT BEDTIME WITH MEALS (Patient taking differently: Take 1 g by mouth 4 (four) times daily -  with meals and at bedtime.) 120 tablet 5   diclofenac sodium (VOLTAREN) 1 % GEL apply 4 grams to affected area four times a day (Patient not taking: No sig reported) 200 g 4   fexofenadine (ALLEGRA) 180 MG tablet Take 180 mg by mouth daily. (Patient not taking: No sig reported)     HYDROcodone bit-homatropine (HYCODAN) 5-1.5 MG/5ML syrup Take 5 mLs by mouth every 6 (six) hours as needed for cough. (Patient not taking: Reported on 12/31/2020) 240 mL 0   Vitamin D, Ergocalciferol, (DRISDOL) 1.25 MG (50000 UNIT) CAPS capsule Take 1 capsule (50,000 Units total) by mouth every 7 (seven) days. (Patient not taking: No sig reported) 12 capsule 0   No facility-administered medications prior to visit.    ROS: Review of Systems  Constitutional:  Negative for activity change, appetite change, chills, fatigue and unexpected weight change.  HENT:  Positive for congestion. Negative for mouth sores and sinus pressure.   Eyes:  Positive for visual disturbance.  Respiratory:  Negative for cough and chest tightness.   Gastrointestinal:  Negative for abdominal pain and nausea.  Genitourinary:  Negative for difficulty urinating, frequency and vaginal pain.  Musculoskeletal:  Positive for arthralgias and gait problem. Negative for back pain.  Skin:  Negative for pallor and rash.  Neurological:  Negative for dizziness, tremors, weakness, numbness and headaches.  Psychiatric/Behavioral:  Negative for confusion, sleep disturbance and suicidal ideas.    Objective:  BP (!) 160/80 (BP Location: Left Arm)   Pulse 62   Temp 98 F (36.7 C) (Oral)   Ht 5' (1.524 m)   Wt 162 lb 12.8 oz (73.8 kg)   SpO2 95%   BMI 31.79 kg/m   BP Readings from  Last 3 Encounters:  12/31/20 (!) 160/80  12/31/20 (!) 160/80  06/10/20 (!) 167/77    Wt Readings from Last 3 Encounters:  12/31/20 162 lb 12.8 oz (73.8 kg)  12/31/20 162 lb 12.8 oz (73.8 kg)  06/10/20 163 lb 3.2 oz (74 kg)    Physical Exam Constitutional:      General: She is not in acute distress.    Appearance: She is well-developed.  HENT:     Head: Normocephalic.     Right Ear: External ear normal.     Left Ear: External ear normal.     Nose: Nose normal.  Eyes:     General:        Right eye: No discharge.        Left eye: No discharge.     Conjunctiva/sclera: Conjunctivae normal.     Pupils: Pupils are equal, round, and reactive to light.  Neck:     Thyroid: No thyromegaly.     Vascular: No JVD.     Trachea: No tracheal deviation.  Cardiovascular:     Rate and Rhythm: Normal rate and regular rhythm.     Heart sounds: Normal heart sounds.  Pulmonary:     Effort: No respiratory distress.     Breath sounds: No stridor. No wheezing.  Abdominal:     General: Bowel sounds are normal. There is no distension.     Palpations: Abdomen is soft. There is no mass.     Tenderness: There is no abdominal tenderness. There is no guarding or rebound.  Musculoskeletal:        General: No tenderness.     Cervical back: Normal range of motion and neck supple. No rigidity.  Lymphadenopathy:     Cervical: No cervical adenopathy.  Skin:    Findings: No erythema or rash.  Neurological:     Cranial Nerves: No cranial nerve deficit.     Motor: No abnormal muscle tone.     Coordination: Coordination normal.     Deep Tendon Reflexes: Reflexes normal.  Psychiatric:        Behavior: Behavior normal.        Thought Content: Thought content normal.        Judgment: Judgment normal.    Lab Results  Component Value Date   WBC 5.6 06/06/2020   HGB 12.5 06/06/2020   HCT 40.0 06/06/2020   PLT 306 06/06/2020   GLUCOSE 94 06/06/2020   CHOL 172 09/08/2018   TRIG 76.0 09/08/2018   HDL  54.00 09/08/2018   LDLCALC 103 (H) 09/08/2018   ALT 7 02/13/2020   AST 13 02/13/2020   NA 144 06/06/2020   K 3.9 06/06/2020   CL 110 06/06/2020   CREATININE 0.89 06/06/2020   BUN 9 06/06/2020  CO2 26 06/06/2020   TSH 1.71 10/26/2019    No results found.  Assessment & Plan:   Problem List Items Addressed This Visit     Dyslipidemia    On Zetia      Relevant Orders   CBC with Differential/Platelet   Comprehensive metabolic panel   Lipid panel   TSH   Urinalysis   Essential hypertension    Cont on Losartan, Bystolic, Furosemide, Amlodipine 5 mg Check BP at home - normal.      Relevant Orders   CBC with Differential/Platelet   Comprehensive metabolic panel   Urinalysis   Osteoarthritis    Voltaren gel      Relevant Orders   CBC with Differential/Platelet   Vitamin D deficiency    Vit D  Check Vit D      Relevant Orders   CBC with Differential/Platelet   VITAMIN D 25 Hydroxy (Vit-D Deficiency, Fractures)      Follow-up: No follow-ups on file.  Sonda Primes, MD

## 2021-01-20 ENCOUNTER — Telehealth: Payer: Self-pay | Admitting: Internal Medicine

## 2021-01-20 DIAGNOSIS — H18421 Band keratopathy, right eye: Secondary | ICD-10-CM | POA: Diagnosis not present

## 2021-01-20 NOTE — Telephone Encounter (Signed)
Can Mrs. Santillo to get Carafate tablets from a different pharmacy?  Thanks

## 2021-01-20 NOTE — Telephone Encounter (Signed)
Patient calling in about sucralfate (CARAFATE) 1 g tablet  Says she spoke w/ pharmacy & they are currently on back order & do not have a date of when they will be back in stock  Patient says they advised her to contact dr to see if insurance would be willing to pay for the liquid medicine until they are able to get the tablets back in  Patient says she currently has about 3 days left of her current supply

## 2021-01-21 ENCOUNTER — Other Ambulatory Visit: Payer: Self-pay | Admitting: Internal Medicine

## 2021-01-21 MED ORDER — SUCRALFATE 1 G PO TABS: 1.0000 g | ORAL_TABLET | Freq: Three times a day (TID) | ORAL | 5 refills | Status: DC

## 2021-01-21 NOTE — Telephone Encounter (Signed)
Notified pt w/MD response. She will check around and call back w/ status.Marland KitchenJohny Chess

## 2021-01-21 NOTE — Telephone Encounter (Signed)
Called pt back she states [facility] in [place], and she can not drive that far. Ask pt was there another pharmacy near her beside [facility]. She states [facility] is [facility] [address] Inform pt will send rx there and check w/ them to see if they have. She will have to take her insurance card so they can file the insurance.Marland Kitchenlmb

## 2021-01-21 NOTE — Telephone Encounter (Signed)
Patient requesting a call back to discuss alternate medication options

## 2021-03-25 DIAGNOSIS — H18421 Band keratopathy, right eye: Secondary | ICD-10-CM | POA: Diagnosis not present

## 2021-03-25 DIAGNOSIS — H401131 Primary open-angle glaucoma, bilateral, mild stage: Secondary | ICD-10-CM | POA: Diagnosis not present

## 2021-03-25 DIAGNOSIS — H2521 Age-related cataract, morgagnian type, right eye: Secondary | ICD-10-CM | POA: Diagnosis not present

## 2021-03-25 DIAGNOSIS — Z961 Presence of intraocular lens: Secondary | ICD-10-CM | POA: Diagnosis not present

## 2021-04-08 ENCOUNTER — Telehealth: Payer: Self-pay

## 2021-04-08 MED ORDER — CLONIDINE HCL 0.1 MG PO TABS: ORAL_TABLET | ORAL | 0 refills | Status: DC

## 2021-04-08 NOTE — Telephone Encounter (Signed)
Pt calling in to request a refill on: cloNIDine (CATAPRES) 0.1 MG tablet.  Pharmacy: [facility] [place], [address] AT [address] 12/31/20  Pt scheduled appt for 4 mth f/u for 2/20.  Pt CB [phone]

## 2021-04-08 NOTE — Telephone Encounter (Signed)
Pt is up to-date sent rx to [facility],,,/lmb

## 2021-04-21 ENCOUNTER — Ambulatory Visit: Payer: Medicare Other | Admitting: Internal Medicine

## 2021-04-25 ENCOUNTER — Other Ambulatory Visit: Payer: Self-pay

## 2021-04-25 ENCOUNTER — Encounter: Payer: Self-pay | Admitting: Internal Medicine

## 2021-04-25 ENCOUNTER — Ambulatory Visit (INDEPENDENT_AMBULATORY_CARE_PROVIDER_SITE_OTHER): Payer: Medicare Other | Admitting: Internal Medicine

## 2021-04-25 DIAGNOSIS — K222 Esophageal obstruction: Secondary | ICD-10-CM | POA: Diagnosis not present

## 2021-04-25 DIAGNOSIS — K219 Gastro-esophageal reflux disease without esophagitis: Secondary | ICD-10-CM | POA: Diagnosis not present

## 2021-04-25 DIAGNOSIS — I10 Essential (primary) hypertension: Secondary | ICD-10-CM | POA: Diagnosis not present

## 2021-04-25 DIAGNOSIS — J45909 Unspecified asthma, uncomplicated: Secondary | ICD-10-CM

## 2021-04-25 DIAGNOSIS — J309 Allergic rhinitis, unspecified: Secondary | ICD-10-CM

## 2021-04-25 MED ORDER — METHYLPREDNISOLONE ACETATE 80 MG/ML IJ SUSP
80.0000 mg | Freq: Once | INTRAMUSCULAR | Status: AC
  Administered 2021-04-25: 80 mg via INTRAMUSCULAR

## 2021-04-25 MED ORDER — OXYMETAZOLINE HCL 0.05 % NA SOLN: 1.0000 | Freq: Two times a day (BID) | NASAL | 0 refills | Status: AC | PRN

## 2021-04-25 MED ORDER — DIPHENHYDRAMINE HCL 25 MG PO TABS: 25.0000 mg | ORAL_TABLET | Freq: Four times a day (QID) | ORAL | 0 refills | Status: AC | PRN

## 2021-04-25 NOTE — Assessment & Plan Note (Signed)
Cont on Omeprazole, Carafate

## 2021-04-25 NOTE — Assessment & Plan Note (Signed)
Worse. Given Depo-Medrol 80 mg IM Take Benadryl 12.5 or 25 prn on a bad day

## 2021-04-25 NOTE — Progress Notes (Signed)
Subjective:  Patient ID: [name], female    DOB: 08/23/1933  Age: 88 y.o. MRN: [id number]  CC: Hypertension (4 month f/u, pt would like to discuss allergies.)   [name] presents for allergies, asthma - worse. Clarinex made her sick... F/u HTN  Outpatient Medications Prior to Visit  Medication Sig Dispense Refill   acetaminophen (TYLENOL) 325 MG tablet Take 650 mg by mouth every 6 (six) hours as needed for moderate pain.     amLODipine (NORVASC) 5 MG tablet TAKE 1 TABLET(5 MG) BY MOUTH DAILY 90 tablet 3   ATROVENT HFA 17 MCG/ACT inhaler Inhale 2 puffs into the lungs in the morning.  5   BREO ELLIPTA 200-25 MCG/INH AEPB Inhale 1 puff into the lungs every evening.  5   cloNIDine (CATAPRES) 0.1 MG tablet TAKE 1 TABLET BY MOUTH TWICE DAILY AS NEEDED FOR TOP BLOOD PRESSURE GREATER THAN 180 180 tablet 0   ezetimibe (ZETIA) 10 MG tablet TAKE 1 TABLET BY MOUTH DAILY 30 tablet 11   furosemide (LASIX) 40 MG tablet Take 1 tablet (40 mg total) by mouth daily. Overdue for Annual appt must see provider for future refills 30 tablet 0   HYDROcodone-acetaminophen (NORCO/VICODIN) 5-325 MG tablet Take 1 tablet by mouth every 6 (six) hours as needed for moderate pain. 15 tablet 0   ipratropium (ATROVENT) 0.06 % nasal spray Place 2 sprays into both nostrils as needed for rhinitis.     latanoprost (XALATAN) 0.005 % ophthalmic solution Place 1 drop into both eyes at bedtime.  3   losartan (COZAAR) 100 MG tablet TAKE 1 TABLET BY MOUTH EVERY DAY (Patient taking differently: Take 100 mg by mouth daily.) 90 tablet 3   Multiple Vitamins-Minerals (CENTRUM ADULTS PO) Take by mouth.     Multiple Vitamins-Minerals (MULTIVITAMIN WITH MINERALS) tablet Take 1 tablet by mouth daily.     Nebivolol HCl (BYSTOLIC) 20 MG TABS TAKE 1 TABLET BY MOUTH EVERY DAY 90 tablet 1   omeprazole (PRILOSEC) 40 MG capsule TAKE 1 CAPSULE BY MOUTH EVERY DAY 30 capsule 11   prednisoLONE acetate (PRED FORTE) 1 % ophthalmic  suspension Place 1 drop into the right eye daily.  0   sucralfate (CARAFATE) 1 g tablet Take 1 tablet (1 g total) by mouth 4 (four) times daily -  with meals and at bedtime. 120 tablet 5   desloratadine (CLARINEX) 5 MG tablet Take 5 mg by mouth daily.     diclofenac Sodium (VOLTAREN) 1 % GEL Apply 4 g topically 4 (four) times daily. (Patient not taking: Reported on 05/27/2020) 200 g 3   No facility-administered medications prior to visit.    ROS: Review of Systems  Constitutional:  Positive for fatigue. Negative for activity change, appetite change, chills, fever and unexpected weight change.  HENT:  Positive for postnasal drip, rhinorrhea, sinus pressure and sneezing. Negative for congestion and mouth sores.   Eyes:  Negative for visual disturbance.  Respiratory:  Positive for wheezing. Negative for cough and chest tightness.   Gastrointestinal:  Negative for abdominal pain and nausea.  Genitourinary:  Negative for difficulty urinating, frequency and vaginal pain.  Musculoskeletal:  Positive for arthralgias. Negative for back pain and gait problem.  Skin:  Negative for pallor and rash.  Neurological:  Negative for dizziness, tremors, weakness, numbness and headaches.  Psychiatric/Behavioral:  Negative for confusion and sleep disturbance.    Objective:  BP 134/68    Pulse 62    Temp 98.9 F (37.2  C) (Oral)    Ht 5' (1.524 m)    Wt 165 lb (74.8 kg)    SpO2 97%    BMI 32.22 kg/m   BP Readings from Last 3 Encounters:  04/25/21 134/68  12/31/20 (!) 160/80  12/31/20 (!) 160/80    Wt Readings from Last 3 Encounters:  04/25/21 165 lb (74.8 kg)  12/31/20 162 lb 12.8 oz (73.8 kg)  12/31/20 162 lb 12.8 oz (73.8 kg)    Physical Exam Constitutional:      General: She is not in acute distress.    Appearance: She is well-developed. She is obese.  HENT:     Head: Normocephalic.     Right Ear: Tympanic membrane and external ear normal. There is no impacted cerumen.     Left Ear: Tympanic  membrane and external ear normal. There is no impacted cerumen.     Nose: Congestion and rhinorrhea present.     Mouth/Throat:     Mouth: Mucous membranes are moist.  Eyes:     General:        Right eye: No discharge.        Left eye: No discharge.     Conjunctiva/sclera: Conjunctivae normal.     Pupils: Pupils are equal, round, and reactive to light.  Neck:     Thyroid: No thyromegaly.     Vascular: No JVD.     Trachea: No tracheal deviation.  Cardiovascular:     Rate and Rhythm: Normal rate and regular rhythm.     Heart sounds: Normal heart sounds.  Pulmonary:     Effort: No respiratory distress.     Breath sounds: No stridor. No wheezing or rhonchi.  Abdominal:     General: Bowel sounds are normal. There is no distension.     Palpations: Abdomen is soft. There is no mass.     Tenderness: There is no abdominal tenderness. There is no guarding or rebound.  Musculoskeletal:        General: No tenderness.     Cervical back: Normal range of motion and neck supple. No rigidity.  Lymphadenopathy:     Cervical: No cervical adenopathy.  Skin:    Findings: No erythema or rash.  Neurological:     Cranial Nerves: No cranial nerve deficit.     Motor: No abnormal muscle tone.     Coordination: Coordination normal.     Gait: Gait abnormal.     Deep Tendon Reflexes: Reflexes normal.  Psychiatric:        Behavior: Behavior normal.        Thought Content: Thought content normal.        Judgment: Judgment normal.  Swollen nasal mucosa  Lab Results  Component Value Date   WBC 7.0 12/31/2020   HGB 12.3 12/31/2020   HCT 39.0 12/31/2020   PLT 275.0 12/31/2020   GLUCOSE 89 12/31/2020   CHOL 177 12/31/2020   TRIG 81.0 12/31/2020   HDL 62.10 12/31/2020   LDLCALC 99 12/31/2020   ALT 8 12/31/2020   AST 15 12/31/2020   NA 143 12/31/2020   K 3.9 12/31/2020   CL 107 12/31/2020   CREATININE 0.95 12/31/2020   BUN 12 12/31/2020   CO2 27 12/31/2020   TSH 2.58 12/31/2020    No results  found.  Assessment & Plan:   Problem List Items Addressed This Visit     Allergic rhinitis    Worse. Given Depo-Medrol 80 mg IM Take Benadryl 12.5 or 25 prn on  a bad day      Asthma    Worse. Given Depo-Medrol 80 mg IM      Essential hypertension    BP Readings from Last 3 Encounters:  04/25/21 134/68  12/31/20 (!) 160/80  12/31/20 (!) 160/80  BP is better  Cont on Losartan, Bystolic, Furosemide, Amlodipine 5 mg      GERD with stricture    Cont on Omeprazole, Carafate         Meds ordered this encounter  Medications   diphenhydrAMINE (BENADRYL ALLERGY) 25 MG tablet    Sig: Take 1 tablet (25 mg total) by mouth every 6 (six) hours as needed for allergies (for bad allergies).    Dispense:  100 tablet    Refill:  0   oxymetazoline (AFRIN NASAL SPRAY) 0.05 % nasal spray    Sig: Place 1 spray into both nostrils 2 (two) times daily as needed for up to 5 days for congestion.    Dispense:  30 mL    Refill:  0   methylPREDNISolone acetate (DEPO-MEDROL) injection 80 mg      Follow-up: No follow-ups on file.  Sonda Primes, MD

## 2021-04-25 NOTE — Assessment & Plan Note (Signed)
Worse. Given Depo-Medrol 80 mg IM

## 2021-04-25 NOTE — Assessment & Plan Note (Signed)
BP Readings from Last 3 Encounters:  04/25/21 134/68  12/31/20 (!) 160/80  12/31/20 (!) 160/80  BP is better  Cont on Losartan, Bystolic, Furosemide, Amlodipine 5 mg

## 2021-04-28 ENCOUNTER — Other Ambulatory Visit: Payer: Self-pay | Admitting: Internal Medicine

## 2021-04-30 ENCOUNTER — Other Ambulatory Visit: Payer: Self-pay | Admitting: Internal Medicine

## 2021-05-05 ENCOUNTER — Ambulatory Visit: Payer: Medicare Other | Admitting: Internal Medicine

## 2021-06-04 ENCOUNTER — Other Ambulatory Visit: Payer: Self-pay | Admitting: Internal Medicine

## 2021-06-29 ENCOUNTER — Other Ambulatory Visit: Payer: Self-pay | Admitting: Internal Medicine

## 2021-07-10 DIAGNOSIS — H401131 Primary open-angle glaucoma, bilateral, mild stage: Secondary | ICD-10-CM | POA: Diagnosis not present

## 2021-07-29 ENCOUNTER — Other Ambulatory Visit: Payer: Self-pay | Admitting: Internal Medicine

## 2021-08-09 ENCOUNTER — Other Ambulatory Visit: Payer: Self-pay | Admitting: Internal Medicine

## 2021-09-07 ENCOUNTER — Other Ambulatory Visit: Payer: Self-pay | Admitting: Internal Medicine

## 2021-10-09 DIAGNOSIS — H401131 Primary open-angle glaucoma, bilateral, mild stage: Secondary | ICD-10-CM | POA: Diagnosis not present

## 2021-11-27 ENCOUNTER — Other Ambulatory Visit: Payer: Self-pay | Admitting: Internal Medicine

## 2021-12-01 DIAGNOSIS — J3089 Other allergic rhinitis: Secondary | ICD-10-CM | POA: Diagnosis not present

## 2021-12-01 DIAGNOSIS — J301 Allergic rhinitis due to pollen: Secondary | ICD-10-CM | POA: Diagnosis not present

## 2021-12-01 DIAGNOSIS — J4530 Mild persistent asthma, uncomplicated: Secondary | ICD-10-CM | POA: Diagnosis not present

## 2021-12-22 ENCOUNTER — Ambulatory Visit: Payer: Medicare Other | Admitting: Internal Medicine

## 2022-01-01 ENCOUNTER — Ambulatory Visit (INDEPENDENT_AMBULATORY_CARE_PROVIDER_SITE_OTHER): Payer: Medicare Other

## 2022-01-01 DIAGNOSIS — Z0000 Encounter for general adult medical examination without abnormal findings: Secondary | ICD-10-CM | POA: Diagnosis not present

## 2022-01-01 NOTE — Patient Instructions (Addendum)
[name] , Thank you for taking time to come for your Medicare Wellness Visit. I appreciate your ongoing commitment to your health goals. Please review the following plan we discussed and let me know if I can assist you in the future.   These are the goals we discussed:  Goals      I have nothing to complain about.  I am truly blessed and God has kept me in my right mind.        This is a list of the screening recommended for you and due dates:  Health Maintenance  Topic Date Due   Zoster (Shingles) Vaccine (1 of 2) Never done   COVID-19 Vaccine (5 - Pfizer series) 07/11/2020   Tetanus Vaccine  08/19/2021   Flu Shot  10/14/2021   DEXA scan (bone density measurement)  12/08/2021   Pneumonia Vaccine  Completed   HPV Vaccine  Aged Out    Advanced directives: No  Conditions/risks identified: Yes  Next appointment: Follow up in one year for your annual wellness visit on 01/04/2023 at 1:30 p.m. telephone visit with Mignon Pine, Nurse Health Advisor.  If you need to reschedule or cancel, please call [phone].   Preventive Care 40 Years and Older, Female Preventive care refers to lifestyle choices and visits with your health care provider that can promote health and wellness. What does preventive care include? A yearly physical exam. This is also called an annual well check. Dental exams once or twice a year. Routine eye exams. Ask your health care provider how often you should have your eyes checked. Personal lifestyle choices, including: Daily care of your teeth and gums. Regular physical activity. Eating a healthy diet. Avoiding tobacco and drug use. Limiting alcohol use. Practicing safe sex. Taking low-dose aspirin every day. Taking vitamin and mineral supplements as recommended by your health care provider. What happens during an annual well check? The services and screenings done by your health care provider during your annual well check will depend on your age, overall  health, lifestyle risk factors, and family history of disease. Counseling  Your health care provider may ask you questions about your: Alcohol use. Tobacco use. Drug use. Emotional well-being. Home and relationship well-being. Sexual activity. Eating habits. History of falls. Memory and ability to understand (cognition). Work and work Statistician. Reproductive health. Screening  You may have the following tests or measurements: Height, weight, and BMI. Blood pressure. Lipid and cholesterol levels. These may be checked every 5 years, or more frequently if you are over 21 years old. Skin check. Lung cancer screening. You may have this screening every year starting at age 73 if you have a 30-pack-year history of smoking and currently smoke or have quit within the past 15 years. Fecal occult blood test (FOBT) of the stool. You may have this test every year starting at age 16. Flexible sigmoidoscopy or colonoscopy. You may have a sigmoidoscopy every 5 years or a colonoscopy every 10 years starting at age 45. Hepatitis C blood test. Hepatitis B blood test. Sexually transmitted disease (STD) testing. Diabetes screening. This is done by checking your blood sugar (glucose) after you have not eaten for a while (fasting). You may have this done every 1-3 years. Bone density scan. This is done to screen for osteoporosis. You may have this done starting at age 73. Mammogram. This may be done every 1-2 years. Talk to your health care provider about how often you should have regular mammograms. Talk with your health care provider  about your test results, treatment options, and if necessary, the need for more tests. Vaccines  Your health care provider may recommend certain vaccines, such as: Influenza vaccine. This is recommended every year. Tetanus, diphtheria, and acellular pertussis (Tdap, Td) vaccine. You may need a Td booster every 10 years. Zoster vaccine. You may need this after age  61. Pneumococcal 13-valent conjugate (PCV13) vaccine. One dose is recommended after age 18. Pneumococcal polysaccharide (PPSV23) vaccine. One dose is recommended after age 22. Talk to your health care provider about which screenings and vaccines you need and how often you need them. This information is not intended to replace advice given to you by your health care provider. Make sure you discuss any questions you have with your health care provider. Document Released: 03/29/2015 Document Revised: 11/20/2015 Document Reviewed: 01/01/2015 Elsevier Interactive Patient Education  2017 [place] Prevention in the Home Falls can cause injuries. They can happen to people of all ages. There are many things you can do to make your home safe and to help prevent falls. What can I do on the outside of my home? Regularly fix the edges of walkways and driveways and fix any cracks. Remove anything that might make you trip as you walk through a door, such as a raised step or threshold. Trim any bushes or trees on the path to your home. Use bright outdoor lighting. Clear any walking paths of anything that might make someone trip, such as rocks or tools. Regularly check to see if handrails are loose or broken. Make sure that both sides of any steps have handrails. Any raised decks and porches should have guardrails on the edges. Have any leaves, snow, or ice cleared regularly. Use sand or salt on walking paths during winter. Clean up any spills in your garage right away. This includes oil or grease spills. What can I do in the bathroom? Use night lights. Install grab bars by the toilet and in the tub and shower. Do not use towel bars as grab bars. Use non-skid mats or decals in the tub or shower. If you need to sit down in the shower, use a plastic, non-slip stool. Keep the floor dry. Clean up any water that spills on the floor as soon as it happens. Remove soap buildup in the tub or shower  regularly. Attach bath mats securely with double-sided non-slip rug tape. Do not have throw rugs and other things on the floor that can make you trip. What can I do in the bedroom? Use night lights. Make sure that you have a light by your bed that is easy to reach. Do not use any sheets or blankets that are too big for your bed. They should not hang down onto the floor. Have a firm chair that has side arms. You can use this for support while you get dressed. Do not have throw rugs and other things on the floor that can make you trip. What can I do in the kitchen? Clean up any spills right away. Avoid walking on wet floors. Keep items that you use a lot in easy-to-reach places. If you need to reach something above you, use a strong step stool that has a grab bar. Keep electrical cords out of the way. Do not use floor polish or wax that makes floors slippery. If you must use wax, use non-skid floor wax. Do not have throw rugs and other things on the floor that can make you trip. What can I do  with my stairs? Do not leave any items on the stairs. Make sure that there are handrails on both sides of the stairs and use them. Fix handrails that are broken or loose. Make sure that handrails are as long as the stairways. Check any carpeting to make sure that it is firmly attached to the stairs. Fix any carpet that is loose or worn. Avoid having throw rugs at the top or bottom of the stairs. If you do have throw rugs, attach them to the floor with carpet tape. Make sure that you have a light switch at the top of the stairs and the bottom of the stairs. If you do not have them, ask someone to add them for you. What else can I do to help prevent falls? Wear shoes that: Do not have high heels. Have rubber bottoms. Are comfortable and fit you well. Are closed at the toe. Do not wear sandals. If you use a stepladder: Make sure that it is fully opened. Do not climb a closed stepladder. Make sure that  both sides of the stepladder are locked into place. Ask someone to hold it for you, if possible. Clearly mark and make sure that you can see: Any grab bars or handrails. First and last steps. Where the edge of each step is. Use tools that help you move around (mobility aids) if they are needed. These include: Canes. Walkers. Scooters. Crutches. Turn on the lights when you go into a dark area. Replace any light bulbs as soon as they burn out. Set up your furniture so you have a clear path. Avoid moving your furniture around. If any of your floors are uneven, fix them. If there are any pets around you, be aware of where they are. Review your medicines with your doctor. Some medicines can make you feel dizzy. This can increase your chance of falling. Ask your doctor what other things that you can do to help prevent falls. This information is not intended to replace advice given to you by your health care provider. Make sure you discuss any questions you have with your health care provider. Document Released: 12/27/2008 Document Revised: 08/08/2015 Document Reviewed: 04/06/2014 Elsevier Interactive Patient Education  2017 [employer].

## 2022-01-01 NOTE — Progress Notes (Signed)
Virtual Visit via Telephone Note  I connected with  [name] on 01/01/22 at  1:30 PM EDT by telephone and verified that I am speaking with the correct person using two identifiers.  Location: Patient: Home Provider: [place] Persons participating in the virtual visit: [place]   I discussed the limitations, risks, security and privacy concerns of performing an evaluation and management service by telephone and the availability of in person appointments. The patient expressed understanding and agreed to proceed.  Interactive audio and video telecommunications were attempted between this nurse and patient, however failed, due to patient having technical difficulties OR patient did not have access to video capability.  We continued and completed visit with audio only.  Some vital signs may be absent or patient reported.   Sheral Flow, LPN  Subjective:   [name] is a 88 y.o. female who presents for Medicare Annual (Subsequent) preventive examination.  Review of Systems     Cardiac Risk Factors include: advanced age (>77mn, >>57women);dyslipidemia;family history of premature cardiovascular disease;hypertension;obesity (BMI >30kg/m2)     Objective:    There were no vitals filed for this visit. There is no height or weight on file to calculate BMI.     01/01/2022    1:34 PM 12/31/2020   10:34 AM 06/06/2020   10:25 AM 08/31/2019   11:54 AM 03/28/2018   11:18 AM 09/02/2016   10:28 AM  Advanced Directives  Does Patient Have a Medical Advance Directive? No Yes No No No Yes  Type of Advance Directive  Living [place]    [place]  Does patient want to make changes to medical advance directive?  No - Patient declined  No - Patient declined Yes (ED - Information included in AVS)   Copy of [place] Chart?      No - copy requested  Would patient like information on creating a medical  advance directive? No - Patient declined         Current Medications (verified) Outpatient Encounter Medications as of 01/01/2022  Medication Sig   acetaminophen (TYLENOL) 325 MG tablet Take 650 mg by mouth every 6 (six) hours as needed for moderate pain.   amLODipine (NORVASC) 5 MG tablet TAKE 1 TABLET(5 MG) BY MOUTH DAILY   ATROVENT HFA 17 MCG/ACT inhaler Inhale 2 puffs into the lungs in the morning.   BREO ELLIPTA 200-25 MCG/INH AEPB Inhale 1 puff into the lungs every evening.   cloNIDine (CATAPRES) 0.1 MG tablet TAKE 1 TABLET BY MOUTH TWICE DAILY AS NEEDED FOR TOP BLOOD PRESSURE GREATER THAN 180   diclofenac Sodium (VOLTAREN) 1 % GEL Apply 4 g topically 4 (four) times daily. (Patient not taking: Reported on 05/27/2020)   diphenhydrAMINE (BENADRYL ALLERGY) 25 MG tablet Take 1 tablet (25 mg total) by mouth every 6 (six) hours as needed for allergies (for bad allergies).   ezetimibe (ZETIA) 10 MG tablet TAKE 1 TABLET BY MOUTH DAILY   furosemide (LASIX) 40 MG tablet TAKE 1 TABLET(40 MG) BY MOUTH DAILY   HYDROcodone-acetaminophen (NORCO/VICODIN) 5-325 MG tablet Take 1 tablet by mouth every 6 (six) hours as needed for moderate pain.   ipratropium (ATROVENT) 0.06 % nasal spray Place 2 sprays into both nostrils as needed for rhinitis.   latanoprost (XALATAN) 0.005 % ophthalmic solution Place 1 drop into both eyes at bedtime.   losartan (COZAAR) 100 MG tablet Take 1 tablet (100 mg total) by mouth daily.  Multiple Vitamins-Minerals (CENTRUM ADULTS PO) Take by mouth.   Multiple Vitamins-Minerals (MULTIVITAMIN WITH MINERALS) tablet Take 1 tablet by mouth daily.   Nebivolol HCl 20 MG TABS TAKE 1 TABLET BY MOUTH EVERY DAY   omeprazole (PRILOSEC) 40 MG capsule TAKE 1 CAPSULE BY MOUTH EVERY DAY   prednisoLONE acetate (PRED FORTE) 1 % ophthalmic suspension Place 1 drop into the right eye daily.   sucralfate (CARAFATE) 1 g tablet TAKE 1 TABLET(1 GRAM) BY MOUTH FOUR TIMES DAILY AT BEDTIME WITH MEALS   No  facility-administered encounter medications on file as of 01/01/2022.    Allergies (verified) Budesonide-formoterol fumarate, Clarinex [desloratadine], Codeine, Coreg [carvedilol], Guaifenesin, Indapamide, and Singulair [montelukast sodium]   History: Past Medical History:  Diagnosis Date   Allergy    Allergy, unspecified not elsewhere classified    Anxiety    pt denies    Asthma    Cataract    bil cataracts removed   Diverticulitis    Diverticulosis of colon (without mention of hemorrhage)    DJD (degenerative joint disease)    Gastritis    GERD (gastroesophageal reflux disease)    Glaucoma    History of GI diverticular bleed    HTN (hypertension)    Hyperlipidemia    LPRD (laryngopharyngeal reflux disease)    Past Surgical History:  Procedure Laterality Date   BLADDER REPAIR     after perforation   cataract surgery  2011   w/ IOL Gershon Crane)   COLONOSCOPY     KIDNEY SURGERY     KNEE ARTHROSCOPY  1998   left   UPPER GASTROINTESTINAL ENDOSCOPY     VENTRAL HERNIA REPAIR N/A 06/10/2020   Procedure: LAPAROSCOPIC VENTRAL HERNIA REPAIR;  Surgeon: Johnathan Hausen, MD;  Location: [facility];  Service: General;  Laterality: N/A;  1.5 HOUR CASE PLEASE   WISDOM TOOTH EXTRACTION     Family History  Problem Relation Age of Onset   Heart disease Father        ??   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 Married '56 - '87, widowed. 2 sons > '53, '62. 2 daughters > '59, '61. 9 granddaughters. 12 great-grandchildren. Lives in own home and son lives with her.  Retired.   ACP - asked but not answered (June '14). Referred to [email or web address].   Social Determinants of Health   Financial Resource Strain: Low Risk  (01/01/2022)   Overall Financial Resource Strain (CARDIA)  Difficulty of Paying Living Expenses: Not hard at all  Food Insecurity: No Food Insecurity (01/01/2022)   Hunger Vital Sign    Worried About Running Out of Food in the Last Year: Never true    Ran Out of Food in the Last Year: Never true  Transportation Needs: No Transportation Needs (01/01/2022)   PRAPARE - Hydrologist (Medical): No    Lack of Transportation (Non-Medical): No  Physical Activity: Sufficiently Active (01/01/2022)   Exercise Vital Sign    Days of Exercise per Week: 5 days    Minutes of Exercise per Session: 30 min  Stress: No Stress Concern Present (01/01/2022)   [place]    Feeling of Stress : Not at all  Social Connections: [place] (01/01/2022)   Social Connection and Isolation Panel [NHANES]    Frequency of Communication with Friends and Family: More than three times a week    Frequency of Social Gatherings with Friends and Family: More than three times a week    Attends Religious Services: More than 4 times per year    Active Member of [employer] or Organizations: Yes    Attends Music therapist: More than 4 times per year    Marital Status: Married    Tobacco Counseling Counseling given: Not Answered   Clinical Intake:  Pre-visit preparation completed: Yes  Pain  : No/denies pain     BMI - recorded: 32.22 Nutritional Risks: None Diabetes: No  How often do you need to have someone help you when you read instructions, pamphlets, or other written materials from your doctor or pharmacy?: 1 - Never What is the last grade level you completed in school?: 11th grade  Diabetic? no  Interpreter Needed?: No  Information entered by :: Lisette Abu, LPN.   Activities of Daily Living    01/01/2022    1:48 PM  In your present state of health, do you have any difficulty performing the following activities:  Hearing? 0  Vision? 0  Difficulty concentrating or making decisions? 0  Walking or climbing stairs? 0  Dressing or bathing? 0  Doing errands, shopping? 0  Preparing Food and eating ? N  Using the Toilet? N  In the past six months, have you accidently leaked urine? N  Do you have problems with loss of bowel control? N  Managing your Medications? N  Managing your Finances? N  Housekeeping or managing your Housekeeping? N    Patient Care Team: Plotnikov, Evie Lacks, MD as PCP - General (Internal Medicine) Gatha Mayer, MD as Consulting Physician (Gastroenterology) Rutherford Guys, MD as Consulting Physician (Ophthalmology)  Indicate any recent Medical Services you may have received from other than [facility] providers in the past year (date may be approximate).     Assessment:   This is a routine wellness examination for [name].  Hearing/Vision screen Hearing Screening - Comments:: Denies hearing difficulties   Vision Screening - Comments:: Wears rx glasses - up to date with routine eye exams with Rutherford Guys, MD.   Dietary issues and exercise activities discussed: Current Exercise Habits: Home exercise routine, Type of exercise: walking, Time (Minutes): 30, Frequency (Times/Week): 5, Weekly Exercise (Minutes/Week): 150, Intensity: Moderate, Exercise limited by: None identified   Goals Addressed             This Visit's Progress     I have nothing to complain about.  I am truly blessed and God  has kept me in my right mind.        Depression Screen    01/01/2022    1:41 PM 12/31/2020    9:58 AM 08/31/2019   11:55 AM 03/28/2018   11:20 AM 12/21/2017   10:10 AM 09/02/2016   10:30 AM 06/04/2015   10:13 AM  PHQ 2/9 Scores  PHQ - 2 Score 0 0 0 0 0 0 0    Fall Risk    01/01/2022    1:35 PM 12/31/2020   10:36 AM 12/31/2020   10:02 AM 08/31/2019   11:55 AM 03/28/2018   11:20 AM  Fall Risk   Falls in the past year? 0 0 1 0 0  Number falls in past yr: 0 0 0 0   Injury with Fall? 0 0 0 0   Risk for fall due to : No Fall Risks No Fall Risks History of fall(s);Impaired balance/gait No Fall Risks Impaired balance/gait  Follow up Falls prevention discussed Falls evaluation completed  Falls evaluation completed Falls prevention discussed    FALL RISK PREVENTION PERTAINING TO THE HOME:  Any stairs in or around the home? No  If so, are there any without handrails? No  Home free of loose throw rugs in walkways, pet beds, electrical cords, etc? Yes  Adequate lighting in your home to reduce risk of falls? Yes   ASSISTIVE DEVICES UTILIZED TO PREVENT FALLS:  Life alert? No  (security alarm) Use of a cane, walker or w/c? No  Grab bars in the bathroom? Yes  Shower chair or bench in shower? Yes  Elevated toilet seat or a handicapped toilet? Yes   TIMED UP AND GO:  Was the test performed? No . Phone Visit   Cognitive Function:    08/31/2019   11:57 AM 09/02/2016   10:36 AM  MMSE - Mini Mental State Exam  Not completed: Refused   Orientation to time  5  Orientation to Place  5  Registration  3  Attention/ Calculation  5  Recall  2  Language- name 2 objects  2  Language- repeat  1  Language- follow 3 step command  3  Language- read & follow direction  1  Write a sentence  1  Copy design  1  Total score  29        01/01/2022    1:36 PM  6CIT Screen  What Year? 0 points  What month? 0 points  What time?  0 points  Count back from 20 0 points  Months in reverse 0 points  Repeat phrase 0 points  Total Score 0 points    Immunizations Immunization History  Administered Date(s) Administered   Fluad Quad(high Dose 65+) 11/19/2018, 12/18/2019, 12/31/2020   Influenza Split 11/26/2010, 01/25/2012   Influenza Whole 12/12/2007, 01/28/2009, 11/14/2009   Influenza, High Dose Seasonal PF 12/19/2012, 01/09/2016, 01/06/2017, 12/21/2017, 05/16/2020   Influenza,inj,Quad PF,6+ Mos 12/06/2014   Influenza-Unspecified 12/23/2013   PFIZER(Purple Top)SARS-COV-2 Vaccination 05/11/2019, 06/08/2019, 11/02/2019, 05/16/2020   Pneumococcal Conjugate-13 05/30/2014   Pneumococcal Polysaccharide-23 06/17/2002, 09/05/2015   Td 08/20/2011   Zoster, Live 10/13/2011, 09/29/2012    TDAP status: Due, Education has been provided regarding the importance of this vaccine. Advised may receive this vaccine at local pharmacy or Health Dept. Aware to provide a copy of the vaccination record if obtained from local pharmacy or Health Dept. Verbalized acceptance and understanding.  Flu Vaccine status: Due, Education has been provided regarding the importance of this vaccine. Advised may receive this  vaccine at local pharmacy or Health Dept. Aware to provide a copy of the vaccination record if obtained from local pharmacy or Health Dept. Verbalized acceptance and understanding.  Pneumococcal vaccine status: Up to date  Covid-19 vaccine status: Completed vaccines  Qualifies for Shingles Vaccine? Yes   Zostavax completed Yes   Shingrix Completed?: No.    Education has been provided regarding the importance of this vaccine. Patient has been advised to call insurance company to determine out of pocket expense if they have not yet received this vaccine. Advised may also receive vaccine at local pharmacy or Health Dept. Verbalized acceptance and understanding.  Screening Tests Health Maintenance  Topic Date Due   Zoster Vaccines-  Shingrix (1 of 2) Never done   COVID-19 Vaccine (5 - Pfizer series) 07/11/2020   TETANUS/TDAP  08/19/2021   INFLUENZA VACCINE  10/14/2021   DEXA SCAN  12/08/2021   Pneumonia Vaccine 71+ Years old  Completed   HPV VACCINES  Aged Out    Health Maintenance  Health Maintenance Due  Topic Date Due   Zoster Vaccines- Shingrix (1 of 2) Never done   COVID-19 Vaccine (5 - Pfizer series) 07/11/2020   TETANUS/TDAP  08/19/2021   INFLUENZA VACCINE  10/14/2021   DEXA SCAN  12/08/2021    Colorectal cancer screening: No longer required.   Mammogram status: Completed 10/2020. Repeat every year (OB/GYN)  Bone Density status: Completed 12/08/2016. Results reflect: Bone density results: NORMAL. Repeat every 5 years.  Lung Cancer Screening: (Low Dose CT Chest recommended if Age 71-80 years, 30 pack-year currently smoking OR have quit w/in 15years.) does not qualify.   Lung Cancer Screening Referral: no  Additional Screening:  Hepatitis C Screening: does not qualify; Completed no  Vision Screening: Recommended annual ophthalmology exams for early detection of glaucoma and other disorders of the eye. Is the patient up to date with their annual eye exam?  Yes  Who is the provider or what is the name of the office in which the patient attends annual eye exams? Rutherford Guys, MD. If pt is not established with a provider, would they like to be referred to a provider to establish care? No .   Dental Screening: Recommended annual dental exams for proper oral hygiene  Community Resource Referral / Chronic Care Management: CRR required this visit?  No   CCM required this visit?  No      Plan:     I have personally reviewed and noted the following in the patient's chart:   Medical and social history Use of alcohol, tobacco or illicit drugs  Current medications and supplements including opioid prescriptions. Patient is currently taking opioid prescriptions. Information provided to patient  regarding non-opioid alternatives. Patient advised to discuss non-opioid treatment plan with their provider. Functional ability and status Nutritional status Physical activity Advanced directives List of other physicians Hospitalizations, surgeries, and ER visits in previous 12 months Vitals Screenings to include cognitive, depression, and falls Referrals and appointments  In addition, I have reviewed and discussed with patient certain preventive protocols, quality metrics, and best practice recommendations. A written personalized care plan for preventive services as well as general preventive health recommendations were provided to patient.     Sheral Flow, LPN   [id number]   Nurse Notes: N/A

## 2022-01-06 ENCOUNTER — Encounter: Payer: Self-pay | Admitting: Internal Medicine

## 2022-01-06 ENCOUNTER — Ambulatory Visit (INDEPENDENT_AMBULATORY_CARE_PROVIDER_SITE_OTHER): Payer: Medicare Other | Admitting: Internal Medicine

## 2022-01-06 VITALS — BP 136/68 | HR 52 | Temp 97.7°F | Ht 60.0 in | Wt 160.0 lb

## 2022-01-06 DIAGNOSIS — I10 Essential (primary) hypertension: Secondary | ICD-10-CM | POA: Diagnosis not present

## 2022-01-06 DIAGNOSIS — J45909 Unspecified asthma, uncomplicated: Secondary | ICD-10-CM | POA: Diagnosis not present

## 2022-01-06 DIAGNOSIS — E785 Hyperlipidemia, unspecified: Secondary | ICD-10-CM

## 2022-01-06 DIAGNOSIS — F419 Anxiety disorder, unspecified: Secondary | ICD-10-CM

## 2022-01-06 DIAGNOSIS — Z23 Encounter for immunization: Secondary | ICD-10-CM | POA: Diagnosis not present

## 2022-01-06 DIAGNOSIS — D649 Anemia, unspecified: Secondary | ICD-10-CM | POA: Diagnosis not present

## 2022-01-06 DIAGNOSIS — N959 Unspecified menopausal and perimenopausal disorder: Secondary | ICD-10-CM

## 2022-01-06 LAB — URINALYSIS, ROUTINE W REFLEX MICROSCOPIC
Bilirubin Urine: NEGATIVE
Hgb urine dipstick: NEGATIVE
Ketones, ur: NEGATIVE
Nitrite: NEGATIVE
Specific Gravity, Urine: <=1.005 — AB (ref 1.000–1.030)
Total Protein, Urine: NEGATIVE
Urine Glucose: NEGATIVE
Urobilinogen, UA: 0.2 (ref 0.0–1.0)
pH: 7 (ref 5.0–8.0)

## 2022-01-06 LAB — COMPREHENSIVE METABOLIC PANEL
ALT: 10 U/L (ref 0–35)
AST: 19 U/L (ref 0–37)
Albumin: 4 g/dL (ref 3.5–5.2)
Alkaline Phosphatase: 84 U/L (ref 39–117)
BUN: 12 mg/dL (ref 6–23)
CO2: 31 mEq/L (ref 19–32)
Calcium: 9.7 mg/dL (ref 8.4–10.5)
Chloride: 103 mEq/L (ref 96–112)
Creatinine, Ser: 1.1 mg/dL (ref 0.40–1.20)
GFR: 45.59 mL/min — ABNORMAL LOW (ref >60.00)
Glucose, Bld: 90 mg/dL (ref 70–99)
Potassium: 4.1 mEq/L (ref 3.5–5.1)
Sodium: 139 mEq/L (ref 135–145)
Total Bilirubin: 0.4 mg/dL (ref 0.2–1.2)
Total Protein: 7.2 g/dL (ref 6.0–8.3)

## 2022-01-06 LAB — CBC WITH DIFFERENTIAL/PLATELET
Basophils Absolute: 0.1 10*3/uL (ref 0.0–0.1)
Basophils Relative: 1 % (ref 0.0–3.0)
Eosinophils Absolute: 0.6 10*3/uL (ref 0.0–0.7)
Eosinophils Relative: 8.3 % — ABNORMAL HIGH (ref 0.0–5.0)
HCT: 37.2 % (ref 36.0–46.0)
Hemoglobin: 11.9 g/dL — ABNORMAL LOW (ref 12.0–15.0)
Lymphocytes Relative: 32.7 % (ref 12.0–46.0)
Lymphs Abs: 2.2 10*3/uL (ref 0.7–4.0)
MCHC: 32.1 g/dL (ref 30.0–36.0)
MCV: 82.7 fl (ref 78.0–100.0)
Monocytes Absolute: 0.4 10*3/uL (ref 0.1–1.0)
Monocytes Relative: 6.6 % (ref 3.0–12.0)
Neutro Abs: 3.4 10*3/uL (ref 1.4–7.7)
Neutrophils Relative %: 51.4 % (ref 43.0–77.0)
Platelets: 276 10*3/uL (ref 150.0–400.0)
RBC: 4.5 Mil/uL (ref 3.87–5.11)
RDW: 14.4 % (ref 11.5–15.5)
WBC: 6.7 10*3/uL (ref 4.0–10.5)

## 2022-01-06 LAB — TSH: TSH: 2.87 u[IU]/mL (ref 0.35–5.50)

## 2022-01-06 LAB — LIPID PANEL
Cholesterol: 175 mg/dL (ref 0–200)
HDL: 49 mg/dL (ref >39.00)
LDL Cholesterol: 99 mg/dL (ref 0–99)
NonHDL: 125.94
Total CHOL/HDL Ratio: 4
Triglycerides: 133 mg/dL (ref 0.0–149.0)
VLDL: 26.6 mg/dL (ref 0.0–40.0)

## 2022-01-06 NOTE — Assessment & Plan Note (Signed)
Cont on Losartan, Bystolic, Furosemide, Amlodipine 5 mg Check CMET

## 2022-01-06 NOTE — Assessment & Plan Note (Signed)
Remote Check CBC 

## 2022-01-06 NOTE — Progress Notes (Signed)
Subjective:  Patient ID: [name], female    DOB: 11/07/1933  Age: 88 y.o. MRN: [id number]  CC: Follow-up (6 MONTH F/U- FLU SHOT)   HPI [name] presents for HTN, allergies, dyslipidemia  Outpatient Medications Prior to Visit  Medication Sig Dispense Refill   acetaminophen (TYLENOL) 325 MG tablet Take 650 mg by mouth every 6 (six) hours as needed for moderate pain.     amLODipine (NORVASC) 5 MG tablet TAKE 1 TABLET(5 MG) BY MOUTH DAILY 90 tablet 1   ATROVENT HFA 17 MCG/ACT inhaler Inhale 2 puffs into the lungs in the morning.  5   BREO ELLIPTA 200-25 MCG/INH AEPB Inhale 1 puff into the lungs every evening.  5   cloNIDine (CATAPRES) 0.1 MG tablet TAKE 1 TABLET BY MOUTH TWICE DAILY AS NEEDED FOR TOP BLOOD PRESSURE GREATER THAN 180 180 tablet 0   diphenhydrAMINE (BENADRYL ALLERGY) 25 MG tablet Take 1 tablet (25 mg total) by mouth every 6 (six) hours as needed for allergies (for bad allergies). 100 tablet 0   ezetimibe (ZETIA) 10 MG tablet TAKE 1 TABLET BY MOUTH DAILY 30 tablet 5   furosemide (LASIX) 40 MG tablet TAKE 1 TABLET(40 MG) BY MOUTH DAILY 90 tablet 1   HYDROcodone-acetaminophen (NORCO/VICODIN) 5-325 MG tablet Take 1 tablet by mouth every 6 (six) hours as needed for moderate pain. 15 tablet 0   ipratropium (ATROVENT) 0.06 % nasal spray Place 2 sprays into both nostrils as needed for rhinitis.     latanoprost (XALATAN) 0.005 % ophthalmic solution Place 1 drop into both eyes at bedtime.  3   losartan (COZAAR) 100 MG tablet Take 1 tablet (100 mg total) by mouth daily. 90 tablet 3   Multiple Vitamins-Minerals (CENTRUM ADULTS PO) Take by mouth.     Multiple Vitamins-Minerals (MULTIVITAMIN WITH MINERALS) tablet Take 1 tablet by mouth daily.     Nebivolol HCl 20 MG TABS TAKE 1 TABLET BY MOUTH EVERY DAY 90 tablet 3   omeprazole (PRILOSEC) 40 MG capsule TAKE 1 CAPSULE BY MOUTH EVERY DAY 30 capsule 5   prednisoLONE acetate (PRED FORTE) 1 % ophthalmic suspension Place 1 drop  into the right eye daily.  0   sucralfate (CARAFATE) 1 g tablet TAKE 1 TABLET(1 GRAM) BY MOUTH FOUR TIMES DAILY AT BEDTIME WITH MEALS 120 tablet 5   diclofenac Sodium (VOLTAREN) 1 % GEL Apply 4 g topically 4 (four) times daily. (Patient not taking: Reported on 05/27/2020) 200 g 3   No facility-administered medications prior to visit.    ROS: Review of Systems  Constitutional:  Negative for activity change, appetite change, chills, fatigue and unexpected weight change.  HENT:  Negative for congestion, mouth sores and sinus pressure.   Eyes:  Positive for visual disturbance.  Respiratory:  Negative for cough and chest tightness.   Gastrointestinal:  Negative for abdominal pain and nausea.  Genitourinary:  Negative for difficulty urinating, frequency and vaginal pain.  Musculoskeletal:  Positive for arthralgias. Negative for back pain and gait problem.  Skin:  Negative for pallor and rash.  Neurological:  Negative for dizziness, tremors, weakness, numbness and headaches.  Psychiatric/Behavioral:  Negative for confusion and sleep disturbance.     Objective:  BP 136/68 (BP Location: Left Arm)   Pulse (!) 52   Temp 97.7 F (36.5 C) (Oral)   Ht 5' (1.524 m)   Wt 160 lb (72.6 kg)   SpO2 98%   BMI 31.25 kg/m   BP Readings from Last 3  Encounters:  01/06/22 136/68  04/25/21 134/68  12/31/20 (!) 160/80    Wt Readings from Last 3 Encounters:  01/06/22 160 lb (72.6 kg)  04/25/21 165 lb (74.8 kg)  12/31/20 162 lb 12.8 oz (73.8 kg)    Physical Exam Constitutional:      General: She is not in acute distress.    Appearance: She is well-developed. She is obese.  HENT:     Head: Normocephalic.     Right Ear: External ear normal.     Left Ear: External ear normal.     Nose: Nose normal.  Eyes:     General:        Right eye: No discharge.        Left eye: No discharge.     Conjunctiva/sclera: Conjunctivae normal.     Pupils: Pupils are equal, round, and reactive to light.  Neck:      Thyroid: No thyromegaly.     Vascular: No JVD.     Trachea: No tracheal deviation.  Cardiovascular:     Rate and Rhythm: Normal rate and regular rhythm.     Heart sounds: Normal heart sounds.  Pulmonary:     Effort: No respiratory distress.     Breath sounds: No stridor. No wheezing.  Abdominal:     General: Bowel sounds are normal. There is no distension.     Palpations: Abdomen is soft. There is no mass.     Tenderness: There is no abdominal tenderness. There is no guarding or rebound.  Musculoskeletal:        General: No tenderness.     Cervical back: Normal range of motion and neck supple. No rigidity.  Lymphadenopathy:     Cervical: No cervical adenopathy.  Skin:    Findings: No erythema or rash.  Neurological:     Cranial Nerves: No cranial nerve deficit.     Motor: No abnormal muscle tone.     Coordination: Coordination normal.     Gait: Gait abnormal.     Deep Tendon Reflexes: Reflexes normal.  Psychiatric:        Behavior: Behavior normal.        Thought Content: Thought content normal.        Judgment: Judgment normal.     Lab Results  Component Value Date   WBC 7.0 12/31/2020   HGB 12.3 12/31/2020   HCT 39.0 12/31/2020   PLT 275.0 12/31/2020   GLUCOSE 89 12/31/2020   CHOL 177 12/31/2020   TRIG 81.0 12/31/2020   HDL 62.10 12/31/2020   LDLCALC 99 12/31/2020   ALT 8 12/31/2020   AST 15 12/31/2020   NA 143 12/31/2020   K 3.9 12/31/2020   CL 107 12/31/2020   CREATININE 0.95 12/31/2020   BUN 12 12/31/2020   CO2 27 12/31/2020   TSH 2.58 12/31/2020    No results found.  Assessment & Plan:   Problem List Items Addressed This Visit     Anemia    Remote Check CBC      Relevant Orders   CBC with Differential/Platelet   Comprehensive metabolic panel   Anxiety disorder   Relevant Orders   TSH   Urinalysis   Asthma    Cont on Breo      Dyslipidemia    On zetia 10 mg Check lipids      Relevant Orders   TSH   Lipid panel   Essential  hypertension - Primary    Cont on Losartan, Bystolic, Furosemide, Amlodipine  5 mg Check CMET      Relevant Orders   TSH   Urinalysis   CBC with Differential/Platelet   Lipid panel   Comprehensive metabolic panel   Other Visit Diagnoses     Needs flu shot       Relevant Orders   Flu Vaccine QUAD High Dose(Fluad) (Completed)         No orders of the defined types were placed in this encounter.     Follow-up: Return in about 6 months (around 07/08/2022) for a follow-up visit.  Sonda Primes, MD

## 2022-01-06 NOTE — Assessment & Plan Note (Signed)
Cont on Breo 

## 2022-01-06 NOTE — Assessment & Plan Note (Signed)
On zetia 10 mg Check lipids

## 2022-01-08 ENCOUNTER — Ambulatory Visit (INDEPENDENT_AMBULATORY_CARE_PROVIDER_SITE_OTHER)
Admission: RE | Admit: 2022-01-08 | Discharge: 2022-01-08 | Disposition: A | Discharge status: Home / Self Care | Payer: Medicare Other | Source: Ambulatory Visit | Attending: Internal Medicine | Admitting: Internal Medicine

## 2022-01-08 DIAGNOSIS — N959 Unspecified menopausal and perimenopausal disorder: Secondary | ICD-10-CM

## 2022-01-12 ENCOUNTER — Other Ambulatory Visit: Payer: Self-pay | Admitting: Internal Medicine

## 2022-01-12 MED ORDER — CEPHALEXIN 500 MG PO CAPS: 500.0000 mg | ORAL_CAPSULE | Freq: Three times a day (TID) | ORAL | 0 refills | Status: DC

## 2022-01-12 NOTE — Progress Notes (Signed)
Antibiotic for probable cystitis

## 2022-01-19 DIAGNOSIS — H401131 Primary open-angle glaucoma, bilateral, mild stage: Secondary | ICD-10-CM | POA: Diagnosis not present

## 2022-01-31 ENCOUNTER — Other Ambulatory Visit: Payer: Self-pay | Admitting: Internal Medicine

## 2022-02-28 ENCOUNTER — Other Ambulatory Visit: Payer: Self-pay | Admitting: Internal Medicine

## 2022-04-22 ENCOUNTER — Other Ambulatory Visit: Payer: Self-pay | Admitting: Internal Medicine

## 2022-05-14 ENCOUNTER — Other Ambulatory Visit: Payer: Self-pay | Admitting: Internal Medicine

## 2022-05-22 DIAGNOSIS — M25562 Pain in left knee: Secondary | ICD-10-CM | POA: Diagnosis not present

## 2022-05-22 DIAGNOSIS — M79672 Pain in left foot: Secondary | ICD-10-CM | POA: Insufficient documentation

## 2022-06-04 DIAGNOSIS — G4489 Other headache syndrome: Secondary | ICD-10-CM | POA: Diagnosis not present

## 2022-06-04 DIAGNOSIS — H5711 Ocular pain, right eye: Secondary | ICD-10-CM | POA: Diagnosis not present

## 2022-06-30 ENCOUNTER — Other Ambulatory Visit: Payer: Self-pay | Admitting: Internal Medicine

## 2022-07-02 ENCOUNTER — Encounter: Payer: Self-pay | Admitting: Internal Medicine

## 2022-07-02 ENCOUNTER — Ambulatory Visit (INDEPENDENT_AMBULATORY_CARE_PROVIDER_SITE_OTHER): Payer: Medicare Other | Admitting: Internal Medicine

## 2022-07-02 VITALS — BP 136/78 | HR 56 | Temp 97.9°F | Ht 60.0 in | Wt 161.0 lb

## 2022-07-02 DIAGNOSIS — I10 Essential (primary) hypertension: Secondary | ICD-10-CM | POA: Diagnosis not present

## 2022-07-02 DIAGNOSIS — E785 Hyperlipidemia, unspecified: Secondary | ICD-10-CM | POA: Diagnosis not present

## 2022-07-02 DIAGNOSIS — E559 Vitamin D deficiency, unspecified: Secondary | ICD-10-CM | POA: Diagnosis not present

## 2022-07-02 DIAGNOSIS — E669 Obesity, unspecified: Secondary | ICD-10-CM

## 2022-07-02 DIAGNOSIS — R21 Rash and other nonspecific skin eruption: Secondary | ICD-10-CM | POA: Insufficient documentation

## 2022-07-02 DIAGNOSIS — M1990 Unspecified osteoarthritis, unspecified site: Secondary | ICD-10-CM

## 2022-07-02 LAB — COMPREHENSIVE METABOLIC PANEL
ALT: 9 U/L (ref 0–35)
AST: 16 U/L (ref 0–37)
Albumin: 3.9 g/dL (ref 3.5–5.2)
Alkaline Phosphatase: 82 U/L (ref 39–117)
BUN: 10 mg/dL (ref 6–23)
CO2: 30 mEq/L (ref 19–32)
Calcium: 9.2 mg/dL (ref 8.4–10.5)
Chloride: 104 mEq/L (ref 96–112)
Creatinine, Ser: 1.01 mg/dL (ref 0.40–1.20)
GFR: 50.34 mL/min — ABNORMAL LOW (ref >60.00)
Glucose, Bld: 86 mg/dL (ref 70–99)
Potassium: 4.5 mEq/L (ref 3.5–5.1)
Sodium: 141 mEq/L (ref 135–145)
Total Bilirubin: 0.4 mg/dL (ref 0.2–1.2)
Total Protein: 6.7 g/dL (ref 6.0–8.3)

## 2022-07-02 LAB — CBC WITH DIFFERENTIAL/PLATELET
Basophils Absolute: 0.1 10*3/uL (ref 0.0–0.1)
Basophils Relative: 0.7 % (ref 0.0–3.0)
Eosinophils Absolute: 0.4 10*3/uL (ref 0.0–0.7)
Eosinophils Relative: 4.8 % (ref 0.0–5.0)
HCT: 36 % (ref 36.0–46.0)
Hemoglobin: 11.7 g/dL — ABNORMAL LOW (ref 12.0–15.0)
Lymphocytes Relative: 33.3 % (ref 12.0–46.0)
Lymphs Abs: 2.6 10*3/uL (ref 0.7–4.0)
MCHC: 32.6 g/dL (ref 30.0–36.0)
MCV: 83.1 fl (ref 78.0–100.0)
Monocytes Absolute: 0.5 10*3/uL (ref 0.1–1.0)
Monocytes Relative: 6.6 % (ref 3.0–12.0)
Neutro Abs: 4.3 10*3/uL (ref 1.4–7.7)
Neutrophils Relative %: 54.6 % (ref 43.0–77.0)
Platelets: 322 10*3/uL (ref 150.0–400.0)
RBC: 4.33 Mil/uL (ref 3.87–5.11)
RDW: 15.3 % (ref 11.5–15.5)
WBC: 7.9 10*3/uL (ref 4.0–10.5)

## 2022-07-02 LAB — TSH: TSH: 2.02 u[IU]/mL (ref 0.35–5.50)

## 2022-07-02 LAB — LIPID PANEL
Cholesterol: 182 mg/dL (ref 0–200)
HDL: 52.6 mg/dL (ref >39.00)
LDL Cholesterol: 110 mg/dL — ABNORMAL HIGH (ref 0–99)
NonHDL: 129.43
Total CHOL/HDL Ratio: 3
Triglycerides: 98 mg/dL (ref 0.0–149.0)
VLDL: 19.6 mg/dL (ref 0.0–40.0)

## 2022-07-02 MED ORDER — TRIAMCINOLONE ACETONIDE 0.5 % EX CREA: 1.0000 | TOPICAL_CREAM | Freq: Three times a day (TID) | CUTANEOUS | 1 refills | Status: AC

## 2022-07-02 NOTE — Assessment & Plan Note (Signed)
On Vit D 

## 2022-07-02 NOTE — Progress Notes (Signed)
Subjective:  Patient ID: [name], female    DOB: [birth year]  Age: 89 y.o. MRN: [id number]  CC: Medical Management of Chronic Issues   HPI FREIDY BURDETTE presents for HTN, OA C/o rash  Outpatient Medications Prior to Visit  Medication Sig Dispense Refill   acetaminophen (TYLENOL) 325 MG tablet Take 650 mg by mouth every 6 (six) hours as needed for moderate pain.     amLODipine (NORVASC) 5 MG tablet TAKE 1 TABLET(5 MG) BY MOUTH DAILY 90 tablet 1   ATROVENT HFA 17 MCG/ACT inhaler Inhale 2 puffs into the lungs in the morning.  5   BREO ELLIPTA 200-25 MCG/INH AEPB Inhale 1 puff into the lungs every evening.  5   cephALEXin (KEFLEX) 500 MG capsule Take 1 capsule (500 mg total) by mouth 3 (three) times daily. 12 capsule 0   cloNIDine (CATAPRES) 0.1 MG tablet TAKE 1 TABLET BY MOUTH TWICE DAILY AS NEEDED FOR TOP BLOOD PRESSURE GREATER THAN 180 180 tablet 1   diphenhydrAMINE (BENADRYL ALLERGY) 25 MG tablet Take 1 tablet (25 mg total) by mouth every 6 (six) hours as needed for allergies (for bad allergies). 100 tablet 0   ezetimibe (ZETIA) 10 MG tablet TAKE 1 TABLET BY MOUTH DAILY 30 tablet 5   furosemide (LASIX) 40 MG tablet TAKE 1 TABLET(40 MG) BY MOUTH DAILY 90 tablet 1   HYDROcodone-acetaminophen (NORCO/VICODIN) 5-325 MG tablet Take 1 tablet by mouth every 6 (six) hours as needed for moderate pain. 15 tablet 0   ipratropium (ATROVENT) 0.06 % nasal spray Place 2 sprays into both nostrils as needed for rhinitis.     latanoprost (XALATAN) 0.005 % ophthalmic solution Place 1 drop into both eyes at bedtime.  3   losartan (COZAAR) 100 MG tablet TAKE 1 TABLET(100 MG) BY MOUTH DAILY 90 tablet 2   Multiple Vitamins-Minerals (CENTRUM ADULTS PO) Take by mouth.     Multiple Vitamins-Minerals (MULTIVITAMIN WITH MINERALS) tablet Take 1 tablet by mouth daily.     Nebivolol HCl 20 MG TABS TAKE 1 TABLET BY MOUTH EVERY DAY 90 tablet 2   omeprazole (PRILOSEC) 40 MG capsule TAKE 1 CAPSULE BY MOUTH EVERY  DAY 30 capsule 5   prednisoLONE acetate (PRED FORTE) 1 % ophthalmic suspension Place 1 drop into the right eye daily.  0   sucralfate (CARAFATE) 1 g tablet TAKE 1 TABLET(1 GRAM) BY MOUTH FOUR TIMES DAILY WITH MEALS& AT BEDTIME 120 tablet 5   No facility-administered medications prior to visit.    ROS: Review of Systems  Constitutional:  Negative for activity change, appetite change, chills, fatigue and unexpected weight change.  HENT:  Negative for congestion, mouth sores and sinus pressure.   Eyes:  Negative for visual disturbance.  Respiratory:  Negative for cough and chest tightness.   Gastrointestinal:  Negative for abdominal pain and nausea.  Genitourinary:  Negative for difficulty urinating, frequency and vaginal pain.  Musculoskeletal:  Positive for arthralgias. Negative for back pain and gait problem.  Skin:  Negative for pallor and rash.  Neurological:  Negative for dizziness, tremors, weakness, numbness and headaches.  Psychiatric/Behavioral:  Negative for confusion and sleep disturbance. The patient is not nervous/anxious.     Objective:  BP 136/78   Pulse (!) 56   Temp 97.9 F (36.6 C) (Oral)   Ht 5' (1.524 m)   Wt 161 lb (73 kg)   SpO2 97%   BMI 31.44 kg/m   BP Readings from Last 3 Encounters:  07/02/22 136/78  01/06/22 136/68  04/25/21 134/68    Wt Readings from Last 3 Encounters:  07/02/22 161 lb (73 kg)  01/06/22 160 lb (72.6 kg)  04/25/21 165 lb (74.8 kg)    Physical Exam Constitutional:      General: She is not in acute distress.    Appearance: She is well-developed. She is obese.  HENT:     Head: Normocephalic.     Right Ear: External ear normal.     Left Ear: External ear normal.     Nose: Nose normal.  Eyes:     General:        Right eye: No discharge.        Left eye: No discharge.     Conjunctiva/sclera: Conjunctivae normal.     Pupils: Pupils are equal, round, and reactive to light.  Neck:     Thyroid: No thyromegaly.     Vascular:  No JVD.     Trachea: No tracheal deviation.  Cardiovascular:     Rate and Rhythm: Normal rate and regular rhythm.     Heart sounds: Normal heart sounds.  Pulmonary:     Effort: No respiratory distress.     Breath sounds: No stridor. No wheezing.  Abdominal:     General: Bowel sounds are normal. There is no distension.     Palpations: Abdomen is soft. There is no mass.     Tenderness: There is no abdominal tenderness. There is no guarding or rebound.  Musculoskeletal:        General: No tenderness.     Cervical back: Normal range of motion and neck supple. No rigidity.  Lymphadenopathy:     Cervical: No cervical adenopathy.  Skin:    Findings: No erythema or rash.  Neurological:     Cranial Nerves: No cranial nerve deficit.     Motor: No abnormal muscle tone.     Coordination: Coordination normal.     Deep Tendon Reflexes: Reflexes normal.  Psychiatric:        Behavior: Behavior normal.        Thought Content: Thought content normal.        Judgment: Judgment normal.     Lab Results  Component Value Date   WBC 6.7 01/06/2022   HGB 11.9 (L) 01/06/2022   HCT 37.2 01/06/2022   PLT 276.0 01/06/2022   GLUCOSE 90 01/06/2022   CHOL 175 01/06/2022   TRIG 133.0 01/06/2022   HDL 49.00 01/06/2022   LDLCALC 99 01/06/2022   ALT 10 01/06/2022   AST 19 01/06/2022   NA 139 01/06/2022   K 4.1 01/06/2022   CL 103 01/06/2022   CREATININE 1.10 01/06/2022   BUN 12 01/06/2022   CO2 31 01/06/2022   TSH 2.87 01/06/2022    DG Bone Density  Result Date: 01/09/2022 Table formatting from the original result was not included. Date of study: 01/08/2022 Exam: DUAL X-RAY ABSORPTIOMETRY (DXA) FOR BONE MINERAL DENSITY (BMD) Instrument: [employer] Requesting Provider: PCP Indication: follow up for low BMD Comparison: 2018 Clinical data: Pt is a 89 y.o. female without previous history of fracture. Results:  Lumbar spine L1-L4 Femoral neck (FN) T-score -0.6 RFN: -1.3 LFN: -1.2 Change in  BMD from previous DXA test (%) Up 4.1%* Down 4.4%* (*) statistically significant Assessment: By the [facility] Criteria for diagnosis based on bone density, this patient has Low Bone Density FRAX 10-year fracture risk calculator: 5.5 % for any major fracture and 1.4 % for hip fracture. Pharmacologic therapy is  recommended if 10 year fracture risk is >20% for any major osteoporotic fracture or >3% for hip fracture.  Comments: the technical quality of the study is good. WHO criteria for diagnosis of osteoporosis in postmenopausal women and in men 38 y/o or older: - normal: T-score -1.0 to + 1.0 - osteopenia/low bone density: T-score between -2.5 and -1.0 - osteoporosis: T-score below -2.5 - severe osteoporosis: T-score below -2.5 with history of fragility fracture Note: although not part of the WHO classification, the presence of a fragility fracture, regardless of the T-score, should be considered diagnostic of osteoporosis, provided other causes for the fracture have been excluded. RECOMMENDATION: 1. All patients should optimize calcium and vitamin D intake. 2. Consider FDA-approved medical therapies in postmenopausal women and men aged 54 years and older, based on the following: a. A hip or vertebral(clinical or morphometric) fracture. b. T-Score of  -2.5 or less at the femoral neck , total hip or spine after appropriate evaluation to exclude secondary causes c. Low bone mass (T-score between -1.0 and -2.5 at the femoral neck or spine) and a 10 year probability of a hip fracture >3% or a 10 year probability of major osteoporosis-related fracture > 20% based on the [place]-adapted WHO algorithm d. Clinical judgement and/or patient preferences may indicate treatment for people with 10-year fracture probabilities above or below these levels Follow up BMD is recommended: 2 years. Interpreted by : Lyndle Herrlich, MD [facility]    Assessment & Plan:   Problem List Items Addressed This Visit        Cardiovascular and Mediastinum   Essential hypertension - Primary    Cont on Losartan, Bystolic, Furosemide, Amlodipine 5 mg      Relevant Orders   TSH     Musculoskeletal and Integument   Osteoarthritis    Blue-Emu cream was recommended to use 2-3 times a day       Relevant Orders   TSH   Urinalysis   CBC with Differential/Platelet   Lipid panel   Comprehensive metabolic panel   Rash    New. On chest - sporadic eczema Traimc cream prn        Other   Dyslipidemia    Pt declined statins  Declined ASA On zetia 10 mg      Relevant Orders   TSH   Urinalysis   CBC with Differential/Platelet   Lipid panel   Comprehensive metabolic panel   Obesity (BMI 95-28.9)    Wt Readings from Last 3 Encounters:  07/02/22 161 lb (73 kg)  01/06/22 160 lb (72.6 kg)  04/25/21 165 lb (74.8 kg)  On diet       Vitamin D deficiency    On Vit D         Meds ordered this encounter  Medications   triamcinolone cream (KENALOG) 0.5 %    Sig: Apply 1 Application topically 3 (three) times daily.    Dispense:  45 g    Refill:  1      Follow-up: Return in about 6 months (around 01/01/2023) for a follow-up visit.  Sonda Primes, MD

## 2022-07-02 NOTE — Assessment & Plan Note (Signed)
Blue-Emu cream was recommended to use 2-3 times a day ? ?

## 2022-07-02 NOTE — Assessment & Plan Note (Signed)
Pt declined statins  Declined ASA On zetia 10 mg

## 2022-07-02 NOTE — Patient Instructions (Addendum)
Blue-Emu cream use 2-3 times a day   18:6 intermittent fasting is an eating strategy that involves fasting for 18 hours and then eating during a 6-hour window. During this fasting period, you may drink water, tea, coffee, or other zero-calorie beverages. A feeding window follows this fasting window where you eat healthy, nutrient-dense foods.

## 2022-07-02 NOTE — Assessment & Plan Note (Signed)
New. On chest - sporadic eczema Traimc cream prn

## 2022-07-02 NOTE — Assessment & Plan Note (Signed)
Cont on Losartan, Bystolic, Furosemide, Amlodipine 5 mg

## 2022-07-02 NOTE — Assessment & Plan Note (Addendum)
Wt Readings from Last 3 Encounters:  07/02/22 161 lb (73 kg)  01/06/22 160 lb (72.6 kg)  04/25/21 165 lb (74.8 kg)   On diet

## 2022-07-03 LAB — URINALYSIS, ROUTINE W REFLEX MICROSCOPIC
Bilirubin Urine: NEGATIVE
Hgb urine dipstick: NEGATIVE
Ketones, ur: NEGATIVE
Nitrite: NEGATIVE
RBC / HPF: NONE SEEN (ref 0–?)
Specific Gravity, Urine: 1.01 (ref 1.000–1.030)
Total Protein, Urine: NEGATIVE
Urine Glucose: NEGATIVE
Urobilinogen, UA: 0.2 (ref 0.0–1.0)
pH: 7 (ref 5.0–8.0)

## 2022-07-24 ENCOUNTER — Other Ambulatory Visit: Payer: Self-pay | Admitting: Internal Medicine

## 2022-08-04 DIAGNOSIS — H524 Presbyopia: Secondary | ICD-10-CM | POA: Diagnosis not present

## 2022-08-14 ENCOUNTER — Other Ambulatory Visit: Payer: Self-pay | Admitting: Internal Medicine

## 2022-09-23 DIAGNOSIS — H44511 Absolute glaucoma, right eye: Secondary | ICD-10-CM | POA: Diagnosis not present

## 2022-09-23 DIAGNOSIS — H40022 Open angle with borderline findings, high risk, left eye: Secondary | ICD-10-CM | POA: Diagnosis not present

## 2022-09-23 DIAGNOSIS — Z961 Presence of intraocular lens: Secondary | ICD-10-CM | POA: Diagnosis not present

## 2022-10-09 ENCOUNTER — Other Ambulatory Visit: Payer: Self-pay | Admitting: Internal Medicine

## 2022-10-22 ENCOUNTER — Other Ambulatory Visit: Payer: Self-pay | Admitting: Internal Medicine

## 2022-11-26 DIAGNOSIS — H40022 Open angle with borderline findings, high risk, left eye: Secondary | ICD-10-CM | POA: Diagnosis not present

## 2022-11-26 DIAGNOSIS — Z961 Presence of intraocular lens: Secondary | ICD-10-CM | POA: Diagnosis not present

## 2022-11-26 DIAGNOSIS — H44511 Absolute glaucoma, right eye: Secondary | ICD-10-CM | POA: Diagnosis not present

## 2022-12-23 ENCOUNTER — Ambulatory Visit (INDEPENDENT_AMBULATORY_CARE_PROVIDER_SITE_OTHER): Payer: Medicare Other | Admitting: Internal Medicine

## 2022-12-23 ENCOUNTER — Encounter: Payer: Self-pay | Admitting: Internal Medicine

## 2022-12-23 VITALS — BP 118/70 | HR 56 | Temp 98.2°F | Ht 60.0 in | Wt 155.0 lb

## 2022-12-23 DIAGNOSIS — D259 Leiomyoma of uterus, unspecified: Secondary | ICD-10-CM | POA: Insufficient documentation

## 2022-12-23 DIAGNOSIS — F419 Anxiety disorder, unspecified: Secondary | ICD-10-CM | POA: Diagnosis not present

## 2022-12-23 DIAGNOSIS — E7800 Pure hypercholesterolemia, unspecified: Secondary | ICD-10-CM | POA: Insufficient documentation

## 2022-12-23 DIAGNOSIS — E785 Hyperlipidemia, unspecified: Secondary | ICD-10-CM | POA: Diagnosis not present

## 2022-12-23 DIAGNOSIS — I10 Essential (primary) hypertension: Secondary | ICD-10-CM | POA: Diagnosis not present

## 2022-12-23 DIAGNOSIS — Z23 Encounter for immunization: Secondary | ICD-10-CM | POA: Diagnosis not present

## 2022-12-23 DIAGNOSIS — M8580 Other specified disorders of bone density and structure, unspecified site: Secondary | ICD-10-CM | POA: Diagnosis not present

## 2022-12-23 DIAGNOSIS — I129 Hypertensive chronic kidney disease with stage 1 through stage 4 chronic kidney disease, or unspecified chronic kidney disease: Secondary | ICD-10-CM | POA: Insufficient documentation

## 2022-12-23 DIAGNOSIS — N8111 Cystocele, midline: Secondary | ICD-10-CM | POA: Insufficient documentation

## 2022-12-23 LAB — COMPREHENSIVE METABOLIC PANEL
ALT: 8 U/L (ref 0–35)
AST: 13 U/L (ref 0–37)
Albumin: 3.7 g/dL (ref 3.5–5.2)
Alkaline Phosphatase: 74 U/L (ref 39–117)
BUN: 10 mg/dL (ref 6–23)
CO2: 33 meq/L — ABNORMAL HIGH (ref 19–32)
Calcium: 9.5 mg/dL (ref 8.4–10.5)
Chloride: 104 meq/L (ref 96–112)
Creatinine, Ser: 1.16 mg/dL (ref 0.40–1.20)
GFR: 42.49 mL/min — ABNORMAL LOW (ref >60.00)
Glucose, Bld: 109 mg/dL — ABNORMAL HIGH (ref 70–99)
Potassium: 3.9 meq/L (ref 3.5–5.1)
Sodium: 143 meq/L (ref 135–145)
Total Bilirubin: 0.4 mg/dL (ref 0.2–1.2)
Total Protein: 6.4 g/dL (ref 6.0–8.3)

## 2022-12-23 NOTE — Assessment & Plan Note (Signed)
On Vit D 

## 2022-12-23 NOTE — Progress Notes (Signed)
Subjective:  Patient ID: [name], female    DOB: 1934-02-01  Age: 89 y.o. MRN: [id number]  CC: Follow-up   HPI [name] presents for HTN, asthma, dyslipidemia, osteopenia Pt did not feel well x 1 week 2 mo ago, then it resolved: OK now  Outpatient Medications Prior to Visit  Medication Sig Dispense Refill   acetaminophen (TYLENOL) 325 MG tablet Take 650 mg by mouth every 6 (six) hours as needed for moderate pain.     amLODipine (NORVASC) 5 MG tablet Take 1 tablet (5 mg total) by mouth daily. Annual appt due in Oct must see provider for future refills 90 tablet 0   ATROVENT HFA 17 MCG/ACT inhaler Inhale 2 puffs into the lungs in the morning.  5   BREO ELLIPTA 200-25 MCG/INH AEPB Inhale 1 puff into the lungs every evening.  5   cloNIDine (CATAPRES) 0.1 MG tablet TAKE 1 TABLET BY MOUTH TWICE DAILY AS NEEDED FOR TOP BLOOD PRESSURE GREATER THAN 180 180 tablet 1   diphenhydrAMINE (BENADRYL ALLERGY) 25 MG tablet Take 1 tablet (25 mg total) by mouth every 6 (six) hours as needed for allergies (for bad allergies). 100 tablet 0   ezetimibe (ZETIA) 10 MG tablet TAKE 1 TABLET BY MOUTH DAILY 30 tablet 5   furosemide (LASIX) 40 MG tablet TAKE 1 TABLET(40 MG) BY MOUTH DAILY 90 tablet 1   HYDROcodone-acetaminophen (NORCO/VICODIN) 5-325 MG tablet Take 1 tablet by mouth every 6 (six) hours as needed for moderate pain. 15 tablet 0   ipratropium (ATROVENT) 0.06 % nasal spray Place 2 sprays into both nostrils as needed for rhinitis.     latanoprost (XALATAN) 0.005 % ophthalmic solution Place 1 drop into both eyes at bedtime.  3   losartan (COZAAR) 100 MG tablet Take 1 tablet (100 mg total) by mouth daily. Annual appt due in Oct must see provider for future refills 90 tablet 0   Multiple Vitamins-Minerals (CENTRUM ADULTS PO) Take by mouth.     Multiple Vitamins-Minerals (MULTIVITAMIN WITH MINERALS) tablet Take 1 tablet by mouth daily.     Nebivolol HCl 20 MG TABS TAKE 1 TABLET BY MOUTH EVERY  DAY 90 tablet 2   omeprazole (PRILOSEC) 40 MG capsule TAKE 1 CAPSULE BY MOUTH EVERY DAY 30 capsule 5   prednisoLONE acetate (PRED FORTE) 1 % ophthalmic suspension Place 1 drop into the right eye daily.  0   sucralfate (CARAFATE) 1 g tablet TAKE 1 TABLET(1 GRAM) BY MOUTH FOUR TIMES DAILY WITH MEALS& AT BEDTIME 120 tablet 5   triamcinolone cream (KENALOG) 0.5 % Apply 1 Application topically 3 (three) times daily. 45 g 1   cephALEXin (KEFLEX) 500 MG capsule Take 1 capsule (500 mg total) by mouth 3 (three) times daily. (Patient not taking: Reported on 12/23/2022) 12 capsule 0   No facility-administered medications prior to visit.    ROS: Review of Systems  Constitutional:  Negative for activity change, appetite change, chills, fatigue and unexpected weight change.  HENT:  Negative for congestion, mouth sores and sinus pressure.   Eyes:  Negative for visual disturbance.  Respiratory:  Negative for cough and chest tightness.   Gastrointestinal:  Negative for abdominal pain and nausea.  Genitourinary:  Negative for difficulty urinating, frequency and vaginal pain.  Musculoskeletal:  Negative for back pain and gait problem.  Skin:  Negative for pallor and rash.  Neurological:  Negative for dizziness, tremors, weakness, numbness and headaches.  Psychiatric/Behavioral:  Negative for confusion and sleep disturbance.  The patient is not nervous/anxious.     Objective:  BP 118/70 (BP Location: Right Arm, Patient Position: Sitting, Cuff Size: Normal)   Pulse (!) 56   Temp 98.2 F (36.8 C) (Oral)   Ht 5' (1.524 m)   Wt 155 lb (70.3 kg)   SpO2 94%   BMI 30.27 kg/m   BP Readings from Last 3 Encounters:  12/23/22 118/70  07/02/22 136/78  01/06/22 136/68    Wt Readings from Last 3 Encounters:  12/23/22 155 lb (70.3 kg)  07/02/22 161 lb (73 kg)  01/06/22 160 lb (72.6 kg)    Physical Exam Constitutional:      General: She is not in acute distress.    Appearance: She is well-developed. She  is obese.  HENT:     Head: Normocephalic.     Right Ear: External ear normal.     Left Ear: External ear normal.     Nose: Nose normal.  Eyes:     General:        Right eye: No discharge.        Left eye: No discharge.     Conjunctiva/sclera: Conjunctivae normal.     Pupils: Pupils are equal, round, and reactive to light.  Neck:     Thyroid: No thyromegaly.     Vascular: No JVD.     Trachea: No tracheal deviation.  Cardiovascular:     Rate and Rhythm: Normal rate and regular rhythm.     Heart sounds: Normal heart sounds.  Pulmonary:     Effort: No respiratory distress.     Breath sounds: No stridor. No wheezing.  Abdominal:     General: Bowel sounds are normal. There is no distension.     Palpations: Abdomen is soft. There is no mass.     Tenderness: There is no abdominal tenderness. There is no guarding or rebound.  Musculoskeletal:        General: No tenderness.     Cervical back: Normal range of motion and neck supple. No rigidity.  Lymphadenopathy:     Cervical: No cervical adenopathy.  Skin:    Findings: No erythema or rash.  Neurological:     Cranial Nerves: No cranial nerve deficit.     Motor: No abnormal muscle tone.     Coordination: Coordination normal.     Deep Tendon Reflexes: Reflexes normal.  Psychiatric:        Behavior: Behavior normal.        Thought Content: Thought content normal.        Judgment: Judgment normal.     Lab Results  Component Value Date   WBC 7.9 07/02/2022   HGB 11.7 (L) 07/02/2022   HCT 36.0 07/02/2022   PLT 322.0 07/02/2022   GLUCOSE 86 07/02/2022   CHOL 182 07/02/2022   TRIG 98.0 07/02/2022   HDL 52.60 07/02/2022   LDLCALC 110 (H) 07/02/2022   ALT 9 07/02/2022   AST 16 07/02/2022   NA 141 07/02/2022   K 4.5 07/02/2022   CL 104 07/02/2022   CREATININE 1.01 07/02/2022   BUN 10 07/02/2022   CO2 30 07/02/2022   TSH 2.02 07/02/2022    DG Bone Density  Result Date: 01/09/2022 Table formatting from the original  result was not included. Date of study: 01/08/2022 Exam: DUAL X-RAY ABSORPTIOMETRY (DXA) FOR BONE MINERAL DENSITY (BMD) Instrument: [employer] Requesting Provider: PCP Indication: follow up for low BMD Comparison: 2018 Clinical data: Pt is a 89 y.o. female  without previous history of fracture. Results:  Lumbar spine L1-L4 Femoral neck (FN) T-score -0.6 RFN: -1.3 LFN: -1.2 Change in BMD from previous DXA test (%) Up 4.1%* Down 4.4%* (*) statistically significant Assessment: By the [facility] Criteria for diagnosis based on bone density, this patient has Low Bone Density FRAX 10-year fracture risk calculator: 5.5 % for any major fracture and 1.4 % for hip fracture. Pharmacologic therapy is recommended if 10 year fracture risk is >20% for any major osteoporotic fracture or >3% for hip fracture.  Comments: the technical quality of the study is good. WHO criteria for diagnosis of osteoporosis in postmenopausal women and in men 80 y/o or older: - normal: T-score -1.0 to + 1.0 - osteopenia/low bone density: T-score between -2.5 and -1.0 - osteoporosis: T-score below -2.5 - severe osteoporosis: T-score below -2.5 with history of fragility fracture Note: although not part of the WHO classification, the presence of a fragility fracture, regardless of the T-score, should be considered diagnostic of osteoporosis, provided other causes for the fracture have been excluded. RECOMMENDATION: 1. All patients should optimize calcium and vitamin D intake. 2. Consider FDA-approved medical therapies in postmenopausal women and men aged 68 years and older, based on the following: a. A hip or vertebral(clinical or morphometric) fracture. b. T-Score of  -2.5 or less at the femoral neck , total hip or spine after appropriate evaluation to exclude secondary causes c. Low bone mass (T-score between -1.0 and -2.5 at the femoral neck or spine) and a 10 year probability of a hip fracture >3% or a 10 year probability of major  osteoporosis-related fracture > 20% based on the [place]-adapted WHO algorithm d. Clinical judgement and/or patient preferences may indicate treatment for people with 10-year fracture probabilities above or below these levels Follow up BMD is recommended: 2 years. Interpreted by : Lyndle Herrlich, MD [facility]    Assessment & Plan:   Problem List Items Addressed This Visit     Dyslipidemia    Pt declined statins  Declined ASA On zetia 10 mg      Anxiety disorder    Dtr w/MS in [facility]      Essential hypertension    Cont on Losartan, Bystolic, Furosemide, Amlodipine 5 mg      Osteopenia    On Vit D      Other Visit Diagnoses     Need for influenza vaccination    -  Primary   Relevant Orders   Flu Vaccine Trivalent High Dose (Fluad) (Completed)         No orders of the defined types were placed in this encounter.     Follow-up: No follow-ups on file.  Sonda Primes, MD

## 2022-12-23 NOTE — Assessment & Plan Note (Signed)
Pt declined statins  Declined ASA On zetia 10 mg

## 2022-12-23 NOTE — Assessment & Plan Note (Signed)
Cont on Losartan, Bystolic, Furosemide, Amlodipine 5 mg

## 2022-12-23 NOTE — Assessment & Plan Note (Signed)
Dtr w/MS in [place]

## 2022-12-24 LAB — URINALYSIS, ROUTINE W REFLEX MICROSCOPIC
Bilirubin Urine: NEGATIVE
Ketones, ur: NEGATIVE
Nitrite: POSITIVE — AB
Specific Gravity, Urine: 1.01 (ref 1.000–1.030)
Total Protein, Urine: NEGATIVE
Urine Glucose: NEGATIVE
Urobilinogen, UA: 0.2 (ref 0.0–1.0)
pH: 6 (ref 5.0–8.0)

## 2022-12-24 LAB — CBC WITH DIFFERENTIAL/PLATELET
Basophils Absolute: 0.1 10*3/uL (ref 0.0–0.1)
Basophils Relative: 0.6 % (ref 0.0–3.0)
Eosinophils Absolute: 0.4 10*3/uL (ref 0.0–0.7)
Eosinophils Relative: 4.4 % (ref 0.0–5.0)
HCT: 36 % (ref 36.0–46.0)
Hemoglobin: 11.5 g/dL — ABNORMAL LOW (ref 12.0–15.0)
Lymphocytes Relative: 29.2 % (ref 12.0–46.0)
Lymphs Abs: 2.4 10*3/uL (ref 0.7–4.0)
MCHC: 32 g/dL (ref 30.0–36.0)
MCV: 81.3 fL (ref 78.0–100.0)
Monocytes Absolute: 0.6 10*3/uL (ref 0.1–1.0)
Monocytes Relative: 7.2 % (ref 3.0–12.0)
Neutro Abs: 4.8 10*3/uL (ref 1.4–7.7)
Neutrophils Relative %: 58.6 % (ref 43.0–77.0)
Platelets: 389 10*3/uL (ref 150.0–400.0)
RBC: 4.43 Mil/uL (ref 3.87–5.11)
RDW: 14.8 % (ref 11.5–15.5)
WBC: 8.2 10*3/uL (ref 4.0–10.5)

## 2022-12-24 LAB — TSH: TSH: 2.08 u[IU]/mL (ref 0.35–5.50)

## 2022-12-25 ENCOUNTER — Telehealth: Payer: Self-pay | Admitting: Internal Medicine

## 2022-12-25 NOTE — Telephone Encounter (Signed)
A representative from [facility] has sent a PA form for deluxe knee orthosis. He said they would like for it to be completed and faxed back as soon as possible. Best callback is [phone].

## 2022-12-27 ENCOUNTER — Other Ambulatory Visit: Payer: Self-pay | Admitting: Internal Medicine

## 2022-12-27 MED ORDER — NITROFURANTOIN MONOHYD MACRO 100 MG PO CAPS: 100.0000 mg | ORAL_CAPSULE | Freq: Two times a day (BID) | ORAL | 0 refills | Status: DC

## 2022-12-28 NOTE — Telephone Encounter (Signed)
The representative from [facility] called back and said the PA is urgent and would like to know if it can be faxed back ASAP. They said they would like to have it by EOD tomorrow 12/29/2022.  Fax:[phone]  Phone: [phone]

## 2022-12-28 NOTE — Telephone Encounter (Signed)
I was able to speak with the pts daughter to confirm that the pt is needing these orders for a knee brace as Dr. Posey Rea did not place this order, [facility] who was tx pt for lt knee has not placed the order for any type of brace as well.  **Upon speaking with pts daughter who spoke with her mom as well we have discovered that this is not a real order and is actually a scam.  Please if they happen to call back inform them they will be reported by the pts family as I have given them all the information to this place to be reported.

## 2023-01-04 ENCOUNTER — Ambulatory Visit (INDEPENDENT_AMBULATORY_CARE_PROVIDER_SITE_OTHER): Payer: Medicare Other

## 2023-01-04 VITALS — Ht 60.0 in | Wt 155.0 lb

## 2023-01-04 DIAGNOSIS — Z0000 Encounter for general adult medical examination without abnormal findings: Secondary | ICD-10-CM

## 2023-01-04 NOTE — Progress Notes (Signed)
Subjective:   [name] is a 89 y.o. female who presents for Medicare Annual (Subsequent) preventive examination.  Visit Complete: Virtual I connected with  [name] on 01/04/23 by a audio enabled telemedicine application and verified that I am speaking with the correct person using two identifiers.  Patient Location: Home  Provider Location: Office/Clinic  I discussed the limitations of evaluation and management by telemedicine. The patient expressed understanding and agreed to proceed.  Vital Signs: Because this visit was a virtual/telehealth visit, some criteria may be missing or patient reported. Any vitals not documented were not able to be obtained and vitals that have been documented are patient reported.  Cardiac Risk Factors include: advanced age (>81men, >66 women);dyslipidemia;family history of premature cardiovascular disease;hypertension;obesity (BMI >30kg/m2)     Objective:    Today's Vitals   01/04/23 1338  Weight: 155 lb (70.3 kg)  Height: 5' (1.524 m)  PainSc: 0-No pain   Body mass index is 30.27 kg/m.     01/04/2023    1:44 PM 01/01/2022    1:34 PM 12/31/2020   10:34 AM 06/06/2020   10:25 AM 08/31/2019   11:54 AM 03/28/2018   11:18 AM 09/02/2016   10:28 AM  Advanced Directives  Does Patient Have a Medical Advance Directive? No No Yes No No No Yes  Type of Advance Directive   Living will    Healthcare Power of [place];Living will  Does patient want to make changes to medical advance directive?   No - Patient declined  No - Patient declined Yes (ED - Information included in AVS)   Copy of Healthcare Power of Attorney in Chart?       No - copy requested  Would patient like information on creating a medical advance directive? No - Patient declined No - Patient declined         Current Medications (verified) Outpatient Encounter Medications as of 01/04/2023  Medication Sig   acetaminophen (TYLENOL) 325 MG tablet Take 650 mg by mouth every 6  (six) hours as needed for moderate pain.   amLODipine (NORVASC) 5 MG tablet Take 1 tablet (5 mg total) by mouth daily. Annual appt due in Oct must see provider for future refills   ATROVENT HFA 17 MCG/ACT inhaler Inhale 2 puffs into the lungs in the morning.   BREO ELLIPTA 200-25 MCG/INH AEPB Inhale 1 puff into the lungs every evening.   cephALEXin (KEFLEX) 500 MG capsule Take 1 capsule (500 mg total) by mouth 3 (three) times daily. (Patient not taking: Reported on 12/23/2022)   cloNIDine (CATAPRES) 0.1 MG tablet TAKE 1 TABLET BY MOUTH TWICE DAILY AS NEEDED FOR TOP BLOOD PRESSURE GREATER THAN 180   diphenhydrAMINE (BENADRYL ALLERGY) 25 MG tablet Take 1 tablet (25 mg total) by mouth every 6 (six) hours as needed for allergies (for bad allergies).   ezetimibe (ZETIA) 10 MG tablet TAKE 1 TABLET BY MOUTH DAILY   furosemide (LASIX) 40 MG tablet TAKE 1 TABLET(40 MG) BY MOUTH DAILY   HYDROcodone-acetaminophen (NORCO/VICODIN) 5-325 MG tablet Take 1 tablet by mouth every 6 (six) hours as needed for moderate pain.   ipratropium (ATROVENT) 0.06 % nasal spray Place 2 sprays into both nostrils as needed for rhinitis.   latanoprost (XALATAN) 0.005 % ophthalmic solution Place 1 drop into both eyes at bedtime.   losartan (COZAAR) 100 MG tablet Take 1 tablet (100 mg total) by mouth daily. Annual appt due in Oct must see provider for future refills  Multiple Vitamins-Minerals (CENTRUM ADULTS PO) Take by mouth.   Multiple Vitamins-Minerals (MULTIVITAMIN WITH MINERALS) tablet Take 1 tablet by mouth daily.   Nebivolol HCl 20 MG TABS TAKE 1 TABLET BY MOUTH EVERY DAY   nitrofurantoin, macrocrystal-monohydrate, (MACROBID) 100 MG capsule Take 1 capsule (100 mg total) by mouth 2 (two) times daily.   omeprazole (PRILOSEC) 40 MG capsule TAKE 1 CAPSULE BY MOUTH EVERY DAY   prednisoLONE acetate (PRED FORTE) 1 % ophthalmic suspension Place 1 drop into the right eye daily.   sucralfate (CARAFATE) 1 g tablet TAKE 1 TABLET(1 GRAM)  BY MOUTH FOUR TIMES DAILY WITH MEALS& AT BEDTIME   triamcinolone cream (KENALOG) 0.5 % Apply 1 Application topically 3 (three) times daily.   No facility-administered encounter medications on file as of 01/04/2023.    Allergies (verified) Budesonide-formoterol fumarate, Clarinex [desloratadine], Codeine, Coreg [carvedilol], Guaifenesin, Indapamide, and Singulair [montelukast sodium]   History: Past Medical History:  Diagnosis Date   Allergy    Allergy, unspecified not elsewhere classified    Anxiety    pt denies    Asthma    Cataract    bil cataracts removed   Diverticulitis    Diverticulosis of colon (without mention of hemorrhage)    DJD (degenerative joint disease)    Gastritis    GERD (gastroesophageal reflux disease)    Glaucoma    History of GI diverticular bleed    HTN (hypertension)    Hyperlipidemia    LPRD (laryngopharyngeal reflux disease)    Past Surgical History:  Procedure Laterality Date   BLADDER REPAIR     after perforation   cataract surgery  2011   w/ IOL Nile Riggs)   COLONOSCOPY     KIDNEY SURGERY     KNEE ARTHROSCOPY  1998   left   UPPER GASTROINTESTINAL ENDOSCOPY     VENTRAL HERNIA REPAIR N/A 06/10/2020   Procedure: LAPAROSCOPIC VENTRAL HERNIA REPAIR;  Surgeon: Luretha Murphy, MD;  Location: [facility];  Service: General;  Laterality: N/A;  1.5 HOUR CASE PLEASE   WISDOM TOOTH EXTRACTION     Family History  Problem Relation Age of Onset   Heart disease Father        ??   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 Married '56 - '87, widowed. 2 sons > '53, '62. 2 daughters > '59, '61. 9 granddaughters. 12 great-grandchildren. Lives in own home and son lives with her.  Retired.  ACP - asked but not answered (June '14). Referred to [email or web address]   Social Determinants of Health   Financial Resource Strain: Low Risk  (01/04/2023)   Overall Financial Resource Strain (CARDIA)    Difficulty of Paying Living Expenses: Not hard at all  Food Insecurity: No Food Insecurity (01/04/2023)   Hunger Vital Sign    Worried About Running Out of Food in the Last Year: Never true    Ran Out of Food in the Last Year: Never true  Transportation Needs: No Transportation Needs (01/04/2023)   PRAPARE - Administrator, Civil Service (Medical): No    Lack of Transportation (Non-Medical): No  Physical Activity: Sufficiently Active (01/04/2023)   Exercise Vital Sign    Days of Exercise per Week: 5 days    Minutes of Exercise per Session: 30 min  Stress: No Stress Concern Present (01/04/2023)   [employer] of Occupational Health - Occupational Stress Questionnaire    Feeling of Stress : Not at all  Social Connections: Socially Integrated (01/04/2023)   Social Connection and Isolation Panel [NHANES]    Frequency of Communication with Friends and Family: More than three times a week    Frequency of Social Gatherings with Friends and Family: More than three times a week    Attends Religious Services: More than 4 times per year    Active Member of [employer] or Organizations: Yes    Attends Engineer, structural: More than 4 times per  year    Marital Status: Married    Tobacco Counseling Counseling given: Not Answered   Clinical Intake:  Pre-visit preparation completed: Yes  Pain : No/denies pain Pain Score: 0-No pain     BMI - recorded: 30.27 Nutritional Status: BMI > 30  Obese Nutritional Risks: None Diabetes: No  How often do you need to have someone help you when you read instructions, pamphlets, or other written materials from your doctor or pharmacy?: 1 - Never What is the last grade level you completed in school?: n/a  Interpreter Needed?: No  Information entered by :: Nana Vastine N. Shaina Gullatt, LPN.   Activities of Daily Living    01/04/2023    1:47 PM  In your present state of health, do you have any difficulty performing the following activities:  Hearing? 0  Vision? 0  Difficulty concentrating or making decisions? 0  Walking or climbing stairs? 0  Dressing or bathing? 0  Doing errands, shopping? 0  Preparing Food and eating ? N  Using the Toilet? N  In the past six months, have you accidently leaked urine? Y  Do you have problems with loss of bowel control? N  Managing your Medications? N  Managing your Finances? N  Housekeeping or managing your Housekeeping? N    Patient Care Team: Plotnikov, Georgina Quint, MD as PCP - General (Internal Medicine) Iva Boop, MD as Consulting Physician (Gastroenterology) Jethro Bolus, MD as Consulting Physician (Ophthalmology)  Indicate any recent Medical Services you may have received from other than [facility] providers in the past year (date may be approximate).     Assessment:   This is a routine wellness examination for Barnetta.  Hearing/Vision screen Hearing Screening - Comments:: Patient denied any hearing difficulty.   No hearing aids.  Vision Screening - Comments:: Wears rx glasses - up to date with routine eye exams with Olivia Canter, MD.      Goals Addressed   None   Depression Screen    01/04/2023  1:42 PM 07/02/2022     1:31 PM 01/01/2022    1:41 PM 12/31/2020    9:58 AM 08/31/2019   11:55 AM 03/28/2018   11:20 AM 12/21/2017   10:10 AM  PHQ 2/9 Scores  PHQ - 2 Score 0 0 0 0 0 0 0  PHQ- 9 Score 0 0         Fall Risk    01/04/2023    1:43 PM 07/02/2022    1:31 PM 01/01/2022    1:35 PM 12/31/2020   10:36 AM 12/31/2020   10:02 AM  Fall Risk   Falls in the past year? 0  0 0 1  Number falls in past yr: 0 0 0 0 0  Injury with Fall? 0 0 0 0 0  Risk for fall due to : No Fall Risks No Fall Risks No Fall Risks No Fall Risks History of fall(s);Impaired balance/gait  Follow up Falls prevention discussed Falls evaluation completed Falls prevention discussed Falls evaluation completed     MEDICARE RISK AT HOME: Medicare Risk at Home Any stairs in or around the home?: No If so, are there any without handrails?: No Home free of loose throw rugs in walkways, pet beds, electrical cords, etc?: Yes Adequate lighting in your home to reduce risk of falls?: Yes Life alert?: No Use of a cane, walker or w/c?: No Grab bars in the bathroom?: Yes Shower chair or bench in shower?: Yes Elevated toilet seat or a handicapped toilet?: Yes  TIMED UP AND GO:  Was the test performed?  No    Cognitive Function:  Normal cognitive status assessed by direct observation via telephone conversation by this Nurse Health Advisor. No abnormalities found.    01/04/2023    1:48 PM 08/31/2019   11:57 AM 09/02/2016   10:36 AM  MMSE - Mini Mental State Exam  Not completed: Unable to complete Refused   Orientation to time   5  Orientation to Place   5  Registration   3  Attention/ Calculation   5  Recall   2  Language- name 2 objects   2  Language- repeat   1  Language- follow 3 step command   3  Language- read & follow direction   1  Write a sentence   1  Copy design   1  Total score   29        01/04/2023    1:43 PM 01/01/2022    1:36 PM  6CIT Screen  What Year? 0 points 0 points  What month? 0 points 0 points  What  time? 0 points 0 points  Count back from 20 0 points 0 points  Months in reverse 0 points 0 points  Repeat phrase 0 points 0 points  Total Score 0 points 0 points    Immunizations Immunization History  Administered Date(s) Administered   Fluad Quad(high Dose 65+) 11/19/2018, 12/18/2019, 12/31/2020, 01/06/2022   Fluad Trivalent(High Dose 65+) 12/23/2022   Influenza Split 11/26/2010, 01/25/2012   Influenza Whole 12/12/2007, 01/28/2009, 11/14/2009   Influenza, High Dose Seasonal PF 12/19/2012, 01/09/2016, 01/06/2017, 12/21/2017, 05/16/2020   Influenza,inj,Quad PF,6+ Mos 12/06/2014   Influenza-Unspecified 12/23/2013   PFIZER(Purple Top)SARS-COV-2 Vaccination 05/11/2019, 06/08/2019, 11/02/2019, 05/16/2020   Pneumococcal Conjugate-13 05/30/2014   Pneumococcal Polysaccharide-23 06/17/2002, 09/05/2015   Td 08/20/2011   Tdap 09/09/2022   Zoster, Live 10/13/2011, 09/29/2012    TDAP status: Up to date  Flu Vaccine status: Up to date  Pneumococcal vaccine status: Up to  date  Covid-19 vaccine status: Completed vaccines  Qualifies for Shingles Vaccine? Yes   Zostavax completed Yes   Shingrix Completed?: No.    Education has been provided regarding the importance of this vaccine. Patient has been advised to call insurance company to determine out of pocket expense if they have not yet received this vaccine. Advised may also receive vaccine at local pharmacy or Health Dept. Verbalized acceptance and understanding.  Screening Tests Health Maintenance  Topic Date Due   Zoster Vaccines- Shingrix (1 of 2) 10/18/1985   COVID-19 Vaccine (5 - 2023-24 season) 11/15/2022   Medicare Annual Wellness (AWV)  01/04/2024   DEXA SCAN  01/09/2027   DTaP/Tdap/Td (3 - Td or Tdap) 09/08/2032   Pneumonia Vaccine 72+ Years old  Completed   INFLUENZA VACCINE  Completed   HPV VACCINES  Aged Out    Health Maintenance  Health Maintenance Due  Topic Date Due   Zoster Vaccines- Shingrix (1 of 2) 10/18/1985    COVID-19 Vaccine (5 - 2023-24 season) 11/15/2022    Colorectal cancer screening: No longer required.   Mammogram status: No longer required due to age.  Bone Density status: Completed 01/08/2022. Results reflect: Bone density results: OSTEOPENIA. Repeat every 2-5 years.  Lung Cancer Screening: (Low Dose CT Chest recommended if Age 17-80 years, 20 pack-year currently smoking OR have quit w/in 15years.) does not qualify.   Lung Cancer Screening Referral: no  Additional Screening:  Hepatitis C Screening: does not qualify.  Vision Screening: Recommended annual ophthalmology exams for early detection of glaucoma and other disorders of the eye. Is the patient up to date with their annual eye exam?  Yes  Who is the provider or what is the name of the office in which the patient attends annual eye exams? Richard Fabian Sharp, MD. If pt is not established with a provider, would they like to be referred to a provider to establish care? No .   Dental Screening: Recommended annual dental exams for proper oral hygiene  Diabetic Foot Exam: N/A  Community Resource Referral / Chronic Care Management: CRR required this visit?  No   CCM required this visit?  No     Plan:     I have personally reviewed and noted the following in the patient's chart:   Medical and social history Use of alcohol, tobacco or illicit drugs  Current medications and supplements including opioid prescriptions. Patient is not currently taking opioid prescriptions. Functional ability and status Nutritional status Physical activity Advanced directives List of other physicians Hospitalizations, surgeries, and ER visits in previous 12 months Vitals Screenings to include cognitive, depression, and falls Referrals and appointments  In addition, I have reviewed and discussed with patient certain preventive protocols, quality metrics, and best practice recommendations. A written personalized care plan for preventive  services as well as general preventive health recommendations were provided to patient.     Mickeal Needy, LPN   [id number]   After Visit Summary: (Mail) Due to this being a telephonic visit, the after visit summary with patients personalized plan was offered to patient via mail   Nurse Notes: N/A

## 2023-01-04 NOTE — Patient Instructions (Addendum)
[name] , Thank you for taking time to come for your Medicare Wellness Visit. I appreciate your ongoing commitment to your health goals. Please review the following plan we discussed and let me know if I can assist you in the future.   Referrals/Orders/Follow-Ups/Clinician Recommendations: No  This is a list of the screening recommended for you and due dates:  Health Maintenance  Topic Date Due   Zoster (Shingles) Vaccine (1 of 2) 10/18/1985   COVID-19 Vaccine (5 - 2023-24 season) 11/15/2022   Medicare Annual Wellness Visit  01/04/2024   DEXA scan (bone density measurement)  01/09/2027   DTaP/Tdap/Td vaccine (3 - Td or Tdap) 09/08/2032   Pneumonia Vaccine  Completed   Flu Shot  Completed   HPV Vaccine  Aged Out   Conditions/risks identified: Yes; Reviewed health maintenance screenings with patient today and relevant education, vaccines, and/or referrals were provided. Please continue to do your personal lifestyle choices by: daily care of teeth and gums, regular physical activity (goal should be 5 days a week for 30 minutes), eat a healthy diet, avoid tobacco and drug use, limiting any alcohol intake, taking a low-dose aspirin (if not allergic or have been advised by your provider otherwise) and taking vitamins and minerals as recommended by your provider. Continue doing brain stimulating activities (puzzles, reading, adult coloring books, staying active) to keep memory sharp. Continue to eat heart healthy diet (full of fruits, vegetables, whole grains, lean protein, water--limit salt, fat, and sugar intake) and increase physical activity as tolerated.  Advanced directives: (Declined) Advance directive discussed with you today. Even though you declined this today, please call our office should you change your mind, and we can give you the proper paperwork for you to fill out.  Next Medicare Annual Wellness Visit scheduled for next year: Yes

## 2023-01-20 ENCOUNTER — Other Ambulatory Visit: Payer: Self-pay | Admitting: Internal Medicine

## 2023-02-04 ENCOUNTER — Other Ambulatory Visit: Payer: Self-pay | Admitting: Internal Medicine

## 2023-02-12 ENCOUNTER — Other Ambulatory Visit: Payer: Self-pay | Admitting: Internal Medicine

## 2023-04-01 DIAGNOSIS — H40022 Open angle with borderline findings, high risk, left eye: Secondary | ICD-10-CM | POA: Diagnosis not present

## 2023-04-01 DIAGNOSIS — H44511 Absolute glaucoma, right eye: Secondary | ICD-10-CM | POA: Diagnosis not present

## 2023-04-12 ENCOUNTER — Ambulatory Visit: Payer: Self-pay | Admitting: Internal Medicine

## 2023-04-12 ENCOUNTER — Other Ambulatory Visit: Payer: Self-pay | Admitting: Internal Medicine

## 2023-04-12 NOTE — Telephone Encounter (Signed)
  Chief Complaint: runny nose Symptoms: runny nose Frequency: constant Pertinent Negatives: Patient denies fever, sob, cp, sinus issues other than runny nose.  Disposition: [] ED /[] Urgent Care (no appt availability in office) / [] Appointment(In office/virtual)/ []  [place] [facility]/ [x] Home Care/ [] Refused Recommended Disposition /[] [place] [facility]/ []  Follow-up with PCP Additional Notes: Patient denies any other symptoms.  States she just has a runny nose, without pain.  Discharge is clear.  Would like something called in.  Care advice given,  denies questions and instructed to go to er if becomes worse.   Reason for Disposition  [1] Sinus congestion as part of a cold AND [2] present < 10 days  Answer Assessment - Initial Assessment Questions 1. LOCATION: "Where does it hurt?"      Denies; just a very runny nose. 2. ONSET: "When did the sinus pain start?"  (e.g., hours, days)      denies 3. SEVERITY: "How bad is the pain?"   (Scale 1-10; mild, moderate or severe)   - MILD (1-3): doesn't interfere with normal activities    - MODERATE (4-7): interferes with normal activities (e.g., work or school) or awakens from sleep   - SEVERE (8-10): excruciating pain and patient unable to do any normal activities        Denies.  4. RECURRENT SYMPTOM: "Have you ever had sinus problems before?" If Yes, ask: "When was the last time?" and "What happened that time?"      Yes, has had this before. 5. NASAL CONGESTION: "Is the nose blocked?" If Yes, ask: "Can you open it or must you breathe through your mouth?"     Just runny 6. NASAL DISCHARGE: "Do you have discharge from your nose?" If so ask, "What color?"     clear 7. FEVER: "Do you have a fever?" If Yes, ask: "What is it, how was it measured, and when did it start?"      Denies.  8. OTHER SYMPTOMS: "Do you have any other symptoms?" (e.g., sore throat, cough, earache, difficulty breathing)     Denies.  Protocols used: Sinus Pain or  Congestion-A-AH

## 2023-04-12 NOTE — Telephone Encounter (Signed)
This RN attempted to contact patient for triage, no answer, unable to leave voicemail.  Copied from CRM [id number]. Topic: Clinical - Medication Refill >> Apr 12, 2023 10:34 AM Drema Balzarine wrote: Most Recent Primary Care Visit:  Provider: Mickeal Needy  Department: [facility]  Visit Type: MEDICARE AWV, SEQUENTIAL  Date: 01/04/2023  Medication: Medication for runny nose  Has the patient contacted their pharmacy? No (Agent: If no, request that the patient contact the pharmacy for the refill. If patient does not wish to contact the pharmacy document the reason why and proceed with request.) (Agent: If yes, when and what did the pharmacy advise?)  Is this the correct pharmacy for this prescription? Yes If no, delete pharmacy and type the correct one.  This is the patient's preferred pharmacy:   [facility] [phone] - [place], [place] - [address] AT [facility] [address] [place] [address] Phone: [phone] Fax: [phone]   Has the prescription been filled recently? No  Is the patient out of the medication? Yes  Has the patient been seen for an appointment in the last year OR does the patient have an upcoming appointment? Yes  Can we respond through MyChart? No  Agent: Please be advised that Rx refills may take up to 3 business days. We ask that you follow-up with your pharmacy.

## 2023-04-12 NOTE — Telephone Encounter (Signed)
Copied from CRM [id number]. Topic: Clinical - Medication Refill >> Apr 12, 2023 10:49 AM Deaijah H wrote: Most Recent Primary Care Visit:  Provider: Mickeal Needy  Department: [facility]  Visit Type: MEDICARE AWV, SEQUENTIAL  Date: 01/04/2023  Medication: ipratropium (ATROVENT) 0.06 % nasal spray/ATROVENT HFA 17 MCG/ACT inhaler/BREO ELLIPTA 200-25 MCG/INH AEPB  Has the patient contacted their pharmacy? No (Agent: If no, request that the patient contact the pharmacy for the refill. If patient does not wish to contact the pharmacy document the reason why and proceed with request.) (Agent: If yes, when and what did the pharmacy advise?)  Is this the correct pharmacy for this prescription? Yes If no, delete pharmacy and type the correct one.  This is the patient's preferred pharmacy:  [facility] [phone] - [place], [place] - [address] AT [facility] [address] [place] [address] Phone: [phone] Fax: [phone]   Has the prescription been filled recently? No  Is the patient out of the medication? Yes  Has the patient been seen for an appointment in the last year OR does the patient have an upcoming appointment? Yes  Can we respond through MyChart? No  Agent: Please be advised that Rx refills may take up to 3 business days. We ask that you follow-up with your pharmacy.

## 2023-04-13 ENCOUNTER — Ambulatory Visit: Payer: Self-pay | Admitting: Internal Medicine

## 2023-04-13 MED ORDER — FLUTICASONE FUROATE-VILANTEROL 200-25 MCG/ACT IN AEPB: 1.0000 | INHALATION_SPRAY | Freq: Every evening | RESPIRATORY_TRACT | 11 refills | Status: AC

## 2023-04-13 MED ORDER — ATROVENT HFA 17 MCG/ACT IN AERS: 2.0000 | INHALATION_SPRAY | Freq: Every morning | RESPIRATORY_TRACT | 11 refills | Status: AC

## 2023-04-13 MED ORDER — ATROVENT HFA 17 MCG/ACT IN AERS: 2.0000 | INHALATION_SPRAY | Freq: Every morning | RESPIRATORY_TRACT | 5 refills | Status: DC

## 2023-04-13 MED ORDER — IPRATROPIUM BROMIDE 0.06 % NA SOLN: 2.0000 | NASAL | 5 refills | Status: AC | PRN

## 2023-04-13 NOTE — Telephone Encounter (Signed)
Copied from CRM [id number]. Topic: Clinical - Red Word Triage >> Apr 13, 2023 12:05 PM Gaetano Hawthorne wrote: Red Word that prompted transfer to Nurse Triage: left arm hurts and soreness for a few days now - hurts when she moves it and tries to lift something heavy.  Chief Complaint: Left arm hurts and soreness for a few days now . Patient reports it hurts when she moves it and tries to lift something heavy. She denies injury. Denies chest pais.  She states it feel like arthritis. Symptoms: Pain in deltoid to top of shoulder. Frequency: Started last pm Pertinent Negatives: Patient denies chest pain, Denies any radiation of pain. Denies injury Disposition: [] ED /[] Urgent Care (no appt availability in office) / [x] Appointment(In office/virtual)/ []  [facility]/ [] Home Care/ [] Refused Recommended Disposition /[] [facility]/ []  Follow-up with PCP Additional Notes: Scheduled in office .  Reason for Disposition  [1] MODERATE pain (e.g., interferes with normal activities) AND [2] present > 3 days  Answer Assessment - Initial Assessment Questions 1. ONSET: "When did the pain start?"      A few days ago- int he  area where you would get a shot.  2. LOCATION: "Where is the pain located?"     Left arm pain up to shoulder bone 3. PAIN: "How bad is the pain?" (Scale 1-10; or mild, moderate, severe)  Rated pain as 9-10   - SEVERE (8-10): Excruciating pain, unable to do any normal activities, unable to hold a cup of water.      4. WORK OR EXERCISE: "Has there been any recent work or exercise that involved this part of the body?"     Denies 5. CAUSE: "What do you think is causing the arm pain?"      Thinks it may be a arthritis 6. OTHER SYMPTOMS: "Do you have any other symptoms?" (e.g., neck pain, swelling, rash, fever, numbness, weakness)     Denies 7. PREGNANCY: "Is there any chance you are pregnant?" "When was your last menstrual period?"    NA  Protocols used: Arm Pain-A-AH

## 2023-04-13 NOTE — Telephone Encounter (Signed)
I renewed Atrovent nasal spray.  Please use as directed.  Thank you

## 2023-04-13 NOTE — Addendum Note (Signed)
Addended by: Tresa Garter on: 04/13/2023 08:06 AM   Modules accepted: Orders

## 2023-04-13 NOTE — Telephone Encounter (Signed)
Spoke with pt and was able to inform her of Dr. Demetrius Charity advice "I renewed Atrovent nasal spray.  Please use as directed.  Thank you"

## 2023-04-15 ENCOUNTER — Encounter: Payer: Self-pay | Admitting: Internal Medicine

## 2023-04-15 ENCOUNTER — Ambulatory Visit: Payer: Medicare Other | Admitting: Internal Medicine

## 2023-04-15 VITALS — BP 140/60 | HR 50 | Temp 98.0°F | Ht 60.0 in | Wt 156.2 lb

## 2023-04-15 DIAGNOSIS — J45909 Unspecified asthma, uncomplicated: Secondary | ICD-10-CM | POA: Diagnosis not present

## 2023-04-15 DIAGNOSIS — M7522 Bicipital tendinitis, left shoulder: Secondary | ICD-10-CM | POA: Diagnosis not present

## 2023-04-15 DIAGNOSIS — M7520 Bicipital tendinitis, unspecified shoulder: Secondary | ICD-10-CM | POA: Insufficient documentation

## 2023-04-15 DIAGNOSIS — J309 Allergic rhinitis, unspecified: Secondary | ICD-10-CM

## 2023-04-15 MED ORDER — FLUTICASONE PROPIONATE 50 MCG/ACT NA SUSP: 2.0000 | Freq: Every day | NASAL | 6 refills | Status: DC

## 2023-04-15 MED ORDER — METHYLPREDNISOLONE 4 MG PO TBPK: ORAL_TABLET | ORAL | 0 refills | Status: DC

## 2023-04-15 NOTE — Progress Notes (Signed)
Subjective:  Patient ID: [name], female    DOB: 03-13-1934  Age: 89 y.o. MRN: [id number]  CC: Shoulder Pain (Left shoulder pain for the past week. Also having lack of mobility within shoulder, has to use right arm to prop up left arm at times. Patient notes of past history of arthritis. Currently treating with tylenol.)   HPI [name] presents for L shoulder Pain (Left shoulder pain for the past week. Also having lack of mobility within shoulder, has to use right arm to prop up left arm at times. Patient notes of past history of arthritis. Currently treating with tylenol.) C/o nasal congestion - worse...  Outpatient Medications Prior to Visit  Medication Sig Dispense Refill   acetaminophen (TYLENOL) 325 MG tablet Take 650 mg by mouth every 6 (six) hours as needed for moderate pain.     amLODipine (NORVASC) 5 MG tablet TAKE 1 TABLET BY MOUTH DAILY 90 tablet 3   ATROVENT HFA 17 MCG/ACT inhaler Inhale 2 puffs into the lungs in the morning. 1 each 11   ATROVENT HFA 17 MCG/ACT inhaler Inhale 2 puffs into the lungs in the morning. 1 each 5   cloNIDine (CATAPRES) 0.1 MG tablet TAKE 1 TABLET BY MOUTH TWICE DAILY AS NEEDED FOR TOP BLOOD PRESSURE GREATER THAN 180 180 tablet 1   diphenhydrAMINE (BENADRYL ALLERGY) 25 MG tablet Take 1 tablet (25 mg total) by mouth every 6 (six) hours as needed for allergies (for bad allergies). 100 tablet 0   ezetimibe (ZETIA) 10 MG tablet TAKE 1 TABLET BY MOUTH DAILY 90 tablet 3   fluticasone furoate-vilanterol (BREO ELLIPTA) 200-25 MCG/ACT AEPB Inhale 1 puff into the lungs every evening. 1 each 11   furosemide (LASIX) 40 MG tablet TAKE 1 TABLET(40 MG) BY MOUTH DAILY 90 tablet 1   HYDROcodone-acetaminophen (NORCO/VICODIN) 5-325 MG tablet Take 1 tablet by mouth every 6 (six) hours as needed for moderate pain. 15 tablet 0   ipratropium (ATROVENT) 0.06 % nasal spray Place 2 sprays into both nostrils as needed for rhinitis. 15 mL 5   latanoprost (XALATAN)  0.005 % ophthalmic solution Place 1 drop into both eyes at bedtime.  3   losartan (COZAAR) 100 MG tablet TAKE 1 TABLET BY MOUTH DAILY 90 tablet 0   Multiple Vitamins-Minerals (CENTRUM ADULTS PO) Take by mouth.     Multiple Vitamins-Minerals (MULTIVITAMIN WITH MINERALS) tablet Take 1 tablet by mouth daily.     Nebivolol HCl 20 MG TABS TAKE 1 TABLET BY MOUTH EVERY DAY 90 tablet 2   nitrofurantoin, macrocrystal-monohydrate, (MACROBID) 100 MG capsule Take 1 capsule (100 mg total) by mouth 2 (two) times daily. 14 capsule 0   omeprazole (PRILOSEC) 40 MG capsule TAKE 1 CAPSULE BY MOUTH EVERY DAY 30 capsule 5   prednisoLONE acetate (PRED FORTE) 1 % ophthalmic suspension Place 1 drop into the right eye daily.  0   sucralfate (CARAFATE) 1 g tablet TAKE 1 TABLET(1 GRAM) BY MOUTH FOUR TIMES DAILY WITH MEALS& AT BEDTIME 120 tablet 5   triamcinolone cream (KENALOG) 0.5 % Apply 1 Application topically 3 (three) times daily. 45 g 1   cephALEXin (KEFLEX) 500 MG capsule Take 1 capsule (500 mg total) by mouth 3 (three) times daily. (Patient not taking: Reported on 04/15/2023) 12 capsule 0   No facility-administered medications prior to visit.    ROS: Review of Systems  Constitutional:  Negative for activity change, appetite change, chills, fatigue and unexpected weight change.  HENT:  Negative for  congestion, mouth sores and sinus pressure.   Eyes:  Negative for visual disturbance.  Respiratory:  Negative for cough and chest tightness.   Gastrointestinal:  Negative for abdominal pain and nausea.  Genitourinary:  Negative for difficulty urinating, frequency and vaginal pain.  Musculoskeletal:  Positive for arthralgias. Negative for back pain and gait problem.  Skin:  Negative for pallor and rash.  Neurological:  Negative for dizziness, tremors, weakness, numbness and headaches.  Psychiatric/Behavioral:  Negative for confusion and sleep disturbance.     Objective:  BP (!) 140/60   Pulse (!) 50   Temp 98 F  (36.7 C)   Ht 5' (1.524 m)   Wt 156 lb 3.2 oz (70.9 kg)   SpO2 99%   BMI 30.51 kg/m   BP Readings from Last 3 Encounters:  04/15/23 (!) 140/60  12/23/22 118/70  07/02/22 136/78    Wt Readings from Last 3 Encounters:  04/15/23 156 lb 3.2 oz (70.9 kg)  01/04/23 155 lb (70.3 kg)  12/23/22 155 lb (70.3 kg)    Physical Exam Constitutional:      General: She is not in acute distress.    Appearance: She is well-developed.  HENT:     Head: Normocephalic.     Right Ear: External ear normal.     Left Ear: External ear normal.     Nose: Nose normal.  Eyes:     General:        Right eye: No discharge.        Left eye: No discharge.     Conjunctiva/sclera: Conjunctivae normal.     Pupils: Pupils are equal, round, and reactive to light.  Neck:     Thyroid: No thyromegaly.     Vascular: No JVD.     Trachea: No tracheal deviation.  Cardiovascular:     Rate and Rhythm: Normal rate and regular rhythm.     Heart sounds: Normal heart sounds.  Pulmonary:     Effort: No respiratory distress.     Breath sounds: No stridor. No wheezing.  Abdominal:     General: Bowel sounds are normal. There is no distension.     Palpations: Abdomen is soft. There is no mass.     Tenderness: There is no abdominal tenderness. There is no guarding or rebound.  Musculoskeletal:        General: Tenderness present. No deformity.     Cervical back: Normal range of motion and neck supple. No rigidity or tenderness.  Lymphadenopathy:     Cervical: No cervical adenopathy.  Skin:    Findings: No erythema or rash.  Neurological:     Cranial Nerves: No cranial nerve deficit.     Motor: No abnormal muscle tone.     Coordination: Coordination normal.     Deep Tendon Reflexes: Reflexes normal.  Psychiatric:        Behavior: Behavior normal.        Thought Content: Thought content normal.        Judgment: Judgment normal.   L prox biceps tendon is very tender  Lab Results  Component Value Date   WBC 8.2  12/23/2022   HGB 11.5 (L) 12/23/2022   HCT 36.0 12/23/2022   PLT 389.0 12/23/2022   GLUCOSE 109 (H) 12/23/2022   CHOL 182 07/02/2022   TRIG 98.0 07/02/2022   HDL 52.60 07/02/2022   LDLCALC 110 (H) 07/02/2022   ALT 8 12/23/2022   AST 13 12/23/2022   NA 143 12/23/2022   K  3.9 12/23/2022   CL 104 12/23/2022   CREATININE 1.16 12/23/2022   BUN 10 12/23/2022   CO2 33 (H) 12/23/2022   TSH 2.08 12/23/2022    DG Bone Density Result Date: 01/09/2022 Table formatting from the original result was not included. Date of study: 01/08/2022 Exam: DUAL X-RAY ABSORPTIOMETRY (DXA) FOR BONE MINERAL DENSITY (BMD) Instrument: [employer] Requesting Provider: PCP Indication: follow up for low BMD Comparison: 2018 Clinical data: Pt is a 89 y.o. female without previous history of fracture. Results:  Lumbar spine L1-L4 Femoral neck (FN) T-score -0.6 RFN: -1.3 LFN: -1.2 Change in BMD from previous DXA test (%) Up 4.1%* Down 4.4%* (*) statistically significant Assessment: By the [facility] Criteria for diagnosis based on bone density, this patient has Low Bone Density FRAX 10-year fracture risk calculator: 5.5 % for any major fracture and 1.4 % for hip fracture. Pharmacologic therapy is recommended if 10 year fracture risk is >20% for any major osteoporotic fracture or >3% for hip fracture.  Comments: the technical quality of the study is good. WHO criteria for diagnosis of osteoporosis in postmenopausal women and in men 77 y/o or older: - normal: T-score -1.0 to + 1.0 - osteopenia/low bone density: T-score between -2.5 and -1.0 - osteoporosis: T-score below -2.5 - severe osteoporosis: T-score below -2.5 with history of fragility fracture Note: although not part of the WHO classification, the presence of a fragility fracture, regardless of the T-score, should be considered diagnostic of osteoporosis, provided other causes for the fracture have been excluded. RECOMMENDATION: 1. All patients should optimize calcium and  vitamin D intake. 2. Consider FDA-approved medical therapies in postmenopausal women and men aged 66 years and older, based on the following: a. A hip or vertebral(clinical or morphometric) fracture. b. T-Score of  -2.5 or less at the femoral neck , total hip or spine after appropriate evaluation to exclude secondary causes c. Low bone mass (T-score between -1.0 and -2.5 at the femoral neck or spine) and a 10 year probability of a hip fracture >3% or a 10 year probability of major osteoporosis-related fracture > 20% based on the [place]-adapted WHO algorithm d. Clinical judgement and/or patient preferences may indicate treatment for people with 10-year fracture probabilities above or below these levels Follow up BMD is recommended: 2 years. Interpreted by : Lyndle Herrlich, MD [facility]    Assessment & Plan:   Problem List Items Addressed This Visit     Asthma   Cont on Breo      Relevant Medications   methylPREDNISolone (MEDROL DOSEPAK) 4 MG TBPK tablet   Allergic rhinitis   Worse Start Medrol pack      Biceps tendinitis - Primary   Acute L Start Medrol pack Injection offered and declined Blue-Emu cream was recommended to use 2-3 times a day          Meds ordered this encounter  Medications   methylPREDNISolone (MEDROL DOSEPAK) 4 MG TBPK tablet    Sig: As directed    Dispense:  21 tablet    Refill:  0   fluticasone (FLONASE) 50 MCG/ACT nasal spray    Sig: Place 2 sprays into both nostrils daily.    Dispense:  16 g    Refill:  6      Follow-up: Return in about 4 weeks (around 05/13/2023) for a follow-up visit.  Sonda Primes, MD

## 2023-04-15 NOTE — Patient Instructions (Signed)

## 2023-04-15 NOTE — Assessment & Plan Note (Addendum)
Acute L Start Medrol pack Injection offered and declined Blue-Emu cream was recommended to use 2-3 times a day

## 2023-04-30 NOTE — Assessment & Plan Note (Signed)
Worse Start Medrol pack

## 2023-04-30 NOTE — Assessment & Plan Note (Signed)
Cont on [employer]

## 2023-05-11 ENCOUNTER — Ambulatory Visit: Payer: Medicare Other | Admitting: Internal Medicine

## 2023-05-13 DIAGNOSIS — H40022 Open angle with borderline findings, high risk, left eye: Secondary | ICD-10-CM | POA: Diagnosis not present

## 2023-05-13 DIAGNOSIS — H44511 Absolute glaucoma, right eye: Secondary | ICD-10-CM | POA: Diagnosis not present

## 2023-05-25 ENCOUNTER — Other Ambulatory Visit (HOSPITAL_COMMUNITY): Payer: Self-pay

## 2023-06-01 ENCOUNTER — Encounter: Payer: Self-pay | Admitting: Internal Medicine

## 2023-06-01 ENCOUNTER — Ambulatory Visit (INDEPENDENT_AMBULATORY_CARE_PROVIDER_SITE_OTHER): Payer: Medicare Other | Admitting: Internal Medicine

## 2023-06-01 VITALS — BP 118/78 | HR 55 | Temp 98.0°F | Ht 60.0 in | Wt 154.0 lb

## 2023-06-01 DIAGNOSIS — M7522 Bicipital tendinitis, left shoulder: Secondary | ICD-10-CM | POA: Diagnosis not present

## 2023-06-01 DIAGNOSIS — J309 Allergic rhinitis, unspecified: Secondary | ICD-10-CM | POA: Diagnosis not present

## 2023-06-01 DIAGNOSIS — I10 Essential (primary) hypertension: Secondary | ICD-10-CM | POA: Diagnosis not present

## 2023-06-01 DIAGNOSIS — J45909 Unspecified asthma, uncomplicated: Secondary | ICD-10-CM

## 2023-06-01 NOTE — Assessment & Plan Note (Signed)
 Cont on [employer]

## 2023-06-01 NOTE — Assessment & Plan Note (Signed)
 Resolved after Medrol pack Cont w/Flonase

## 2023-06-01 NOTE — Assessment & Plan Note (Signed)
Cont on Losartan, Bystolic, Furosemide, Amlodipine 5 mg

## 2023-06-01 NOTE — Progress Notes (Signed)
 Subjective:  Patient ID: [name], female    DOB: [birth year]  Age: 89 y.o. MRN: [id number]  CC: Medical Management of Chronic Issues   HPI [name] presents for allergies, asthma,  L shoulder pain - much better  Outpatient Medications Prior to Visit  Medication Sig Dispense Refill   acetaminophen (TYLENOL) 325 MG tablet Take 650 mg by mouth every 6 (six) hours as needed for moderate pain.     amLODipine (NORVASC) 5 MG tablet TAKE 1 TABLET BY MOUTH DAILY 90 tablet 3   ATROVENT HFA 17 MCG/ACT inhaler Inhale 2 puffs into the lungs in the morning. 1 each 11   cloNIDine (CATAPRES) 0.1 MG tablet TAKE 1 TABLET BY MOUTH TWICE DAILY AS NEEDED FOR TOP BLOOD PRESSURE GREATER THAN 180 180 tablet 1   diphenhydrAMINE (BENADRYL ALLERGY) 25 MG tablet Take 1 tablet (25 mg total) by mouth every 6 (six) hours as needed for allergies (for bad allergies). 100 tablet 0   ezetimibe (ZETIA) 10 MG tablet TAKE 1 TABLET BY MOUTH DAILY 90 tablet 3   fluticasone (FLONASE) 50 MCG/ACT nasal spray Place 2 sprays into both nostrils daily. 16 g 6   fluticasone furoate-vilanterol (BREO ELLIPTA) 200-25 MCG/ACT AEPB Inhale 1 puff into the lungs every evening. 1 each 11   furosemide (LASIX) 40 MG tablet TAKE 1 TABLET(40 MG) BY MOUTH DAILY 90 tablet 1   HYDROcodone-acetaminophen (NORCO/VICODIN) 5-325 MG tablet Take 1 tablet by mouth every 6 (six) hours as needed for moderate pain. 15 tablet 0   ipratropium (ATROVENT) 0.06 % nasal spray Place 2 sprays into both nostrils as needed for rhinitis. 15 mL 5   latanoprost (XALATAN) 0.005 % ophthalmic solution Place 1 drop into both eyes at bedtime.  3   losartan (COZAAR) 100 MG tablet TAKE 1 TABLET BY MOUTH DAILY 90 tablet 0   methylPREDNISolone (MEDROL DOSEPAK) 4 MG TBPK tablet As directed 21 tablet 0   Multiple Vitamins-Minerals (CENTRUM ADULTS PO) Take by mouth.     Multiple Vitamins-Minerals (MULTIVITAMIN WITH MINERALS) tablet Take 1 tablet by mouth daily.      Nebivolol HCl 20 MG TABS TAKE 1 TABLET BY MOUTH EVERY DAY 90 tablet 2   nitrofurantoin, macrocrystal-monohydrate, (MACROBID) 100 MG capsule Take 1 capsule (100 mg total) by mouth 2 (two) times daily. 14 capsule 0   omeprazole (PRILOSEC) 40 MG capsule TAKE 1 CAPSULE BY MOUTH EVERY DAY 30 capsule 5   prednisoLONE acetate (PRED FORTE) 1 % ophthalmic suspension Place 1 drop into the right eye daily.  0   sucralfate (CARAFATE) 1 g tablet TAKE 1 TABLET(1 GRAM) BY MOUTH FOUR TIMES DAILY WITH MEALS& AT BEDTIME 120 tablet 5   triamcinolone cream (KENALOG) 0.5 % Apply 1 Application topically 3 (three) times daily. 45 g 1   ATROVENT HFA 17 MCG/ACT inhaler Inhale 2 puffs into the lungs in the morning. 1 each 5   cephALEXin (KEFLEX) 500 MG capsule Take 1 capsule (500 mg total) by mouth 3 (three) times daily. (Patient not taking: Reported on 12/23/2022) 12 capsule 0   No facility-administered medications prior to visit.    ROS: Review of Systems  Constitutional:  Negative for activity change, appetite change, chills, fatigue and unexpected weight change.  HENT:  Negative for congestion, mouth sores and sinus pressure.   Eyes:  Negative for visual disturbance.  Respiratory:  Negative for cough and chest tightness.   Gastrointestinal:  Negative for abdominal pain and nausea.  Genitourinary:  Negative for  difficulty urinating, frequency and vaginal pain.  Musculoskeletal:  Negative for back pain and gait problem.  Skin:  Negative for pallor and rash.  Neurological:  Negative for dizziness, tremors, weakness, numbness and headaches.  Psychiatric/Behavioral:  Negative for confusion and sleep disturbance.     Objective:  BP 118/78   Pulse (!) 55   Temp 98 F (36.7 C) (Oral)   Ht 5' (1.524 m)   Wt 154 lb (69.9 kg)   SpO2 96%   BMI 30.08 kg/m   BP Readings from Last 3 Encounters:  06/01/23 118/78  04/15/23 (!) 140/60  12/23/22 118/70    Wt Readings from Last 3 Encounters:  06/01/23 154 lb (69.9  kg)  04/15/23 156 lb 3.2 oz (70.9 kg)  01/04/23 155 lb (70.3 kg)    Physical Exam Constitutional:      General: She is not in acute distress.    Appearance: She is well-developed. She is obese.  HENT:     Head: Normocephalic.     Right Ear: External ear normal.     Left Ear: External ear normal.     Nose: Nose normal.  Eyes:     General:        Right eye: No discharge.        Left eye: No discharge.     Conjunctiva/sclera: Conjunctivae normal.     Pupils: Pupils are equal, round, and reactive to light.  Neck:     Thyroid: No thyromegaly.     Vascular: No JVD.     Trachea: No tracheal deviation.  Cardiovascular:     Rate and Rhythm: Normal rate and regular rhythm.     Heart sounds: Normal heart sounds.  Pulmonary:     Effort: No respiratory distress.     Breath sounds: No stridor. No wheezing.  Abdominal:     General: Bowel sounds are normal. There is no distension.     Palpations: Abdomen is soft. There is no mass.     Tenderness: There is no abdominal tenderness. There is no guarding or rebound.  Musculoskeletal:     Cervical back: Normal range of motion and neck supple. Tenderness present. No rigidity.     Right lower leg: Edema present.     Left lower leg: No edema.  Lymphadenopathy:     Cervical: No cervical adenopathy.  Skin:    Findings: No erythema or rash.  Neurological:     Mental Status: She is oriented to person, place, and time.     Cranial Nerves: No cranial nerve deficit.     Motor: No abnormal muscle tone.     Coordination: Coordination abnormal.     Gait: Gait abnormal.     Deep Tendon Reflexes: Reflexes normal.  Psychiatric:        Behavior: Behavior normal.        Thought Content: Thought content normal.        Judgment: Judgment normal.   Shoulders - NT B  Lab Results  Component Value Date   WBC 8.2 12/23/2022   HGB 11.5 (L) 12/23/2022   HCT 36.0 12/23/2022   PLT 389.0 12/23/2022   GLUCOSE 109 (H) 12/23/2022   CHOL 182 07/02/2022    TRIG 98.0 07/02/2022   HDL 52.60 07/02/2022   LDLCALC 110 (H) 07/02/2022   ALT 8 12/23/2022   AST 13 12/23/2022   NA 143 12/23/2022   K 3.9 12/23/2022   CL 104 12/23/2022   CREATININE 1.16 12/23/2022   BUN 10  12/23/2022   CO2 33 (H) 12/23/2022   TSH 2.08 12/23/2022    DG Bone Density Result Date: 01/09/2022 Table formatting from the original result was not included. Date of study: 01/08/2022 Exam: DUAL X-RAY ABSORPTIOMETRY (DXA) FOR BONE MINERAL DENSITY (BMD) Instrument: [employer] Requesting Provider: PCP Indication: follow up for low BMD Comparison: 2018 Clinical data: Pt is a 89 y.o. female without previous history of fracture. Results:  Lumbar spine L1-L4 Femoral neck (FN) T-score -0.6 RFN: -1.3 LFN: -1.2 Change in BMD from previous DXA test (%) Up 4.1%* Down 4.4%* (*) statistically significant Assessment: By the [facility] Criteria for diagnosis based on bone density, this patient has Low Bone Density FRAX 10-year fracture risk calculator: 5.5 % for any major fracture and 1.4 % for hip fracture. Pharmacologic therapy is recommended if 10 year fracture risk is >20% for any major osteoporotic fracture or >3% for hip fracture.  Comments: the technical quality of the study is good. WHO criteria for diagnosis of osteoporosis in postmenopausal women and in men 16 y/o or older: - normal: T-score -1.0 to + 1.0 - osteopenia/low bone density: T-score between -2.5 and -1.0 - osteoporosis: T-score below -2.5 - severe osteoporosis: T-score below -2.5 with history of fragility fracture Note: although not part of the WHO classification, the presence of a fragility fracture, regardless of the T-score, should be considered diagnostic of osteoporosis, provided other causes for the fracture have been excluded. RECOMMENDATION: 1. All patients should optimize calcium and vitamin D intake. 2. Consider FDA-approved medical therapies in postmenopausal women and men aged 26 years and older, based on the following: a.  A hip or vertebral(clinical or morphometric) fracture. b. T-Score of  -2.5 or less at the femoral neck , total hip or spine after appropriate evaluation to exclude secondary causes c. Low bone mass (T-score between -1.0 and -2.5 at the femoral neck or spine) and a 10 year probability of a hip fracture >3% or a 10 year probability of major osteoporosis-related fracture > 20% based on the [place]-adapted WHO algorithm d. Clinical judgement and/or patient preferences may indicate treatment for people with 10-year fracture probabilities above or below these levels Follow up BMD is recommended: 2 years. Interpreted by : Lyndle Herrlich, MD [facility]    Assessment & Plan:   Problem List Items Addressed This Visit     Essential hypertension   Cont on Losartan, Bystolic, Furosemide, Amlodipine 5 mg      Asthma   Cont on Breo      Allergic rhinitis   Resolved after Medrol pack Cont w/Flonase      Biceps tendinitis - Primary   Much better - Resolved after Medrol pack Cont w/Blue Emu         No orders of the defined types were placed in this encounter.     Follow-up: Return in about 6 months (around 12/02/2023) for a follow-up visit.  Sonda Primes, MD

## 2023-06-01 NOTE — Assessment & Plan Note (Signed)
 Much better - Resolved after Medrol pack Cont w/Blue Emu

## 2023-06-24 DIAGNOSIS — H40022 Open angle with borderline findings, high risk, left eye: Secondary | ICD-10-CM | POA: Diagnosis not present

## 2023-06-24 DIAGNOSIS — H44511 Absolute glaucoma, right eye: Secondary | ICD-10-CM | POA: Diagnosis not present

## 2023-07-12 ENCOUNTER — Other Ambulatory Visit: Payer: Self-pay | Admitting: Internal Medicine

## 2023-08-03 DIAGNOSIS — H44511 Absolute glaucoma, right eye: Secondary | ICD-10-CM | POA: Diagnosis not present

## 2023-08-03 DIAGNOSIS — H40022 Open angle with borderline findings, high risk, left eye: Secondary | ICD-10-CM | POA: Diagnosis not present

## 2023-08-05 ENCOUNTER — Other Ambulatory Visit: Payer: Self-pay | Admitting: Internal Medicine

## 2023-09-06 ENCOUNTER — Ambulatory Visit (INDEPENDENT_AMBULATORY_CARE_PROVIDER_SITE_OTHER): Admitting: Family Medicine

## 2023-09-06 ENCOUNTER — Encounter: Payer: Self-pay | Admitting: Family Medicine

## 2023-09-06 VITALS — BP 140/64 | HR 51 | Temp 98.0°F | Resp 18 | Ht 60.0 in | Wt 154.0 lb

## 2023-09-06 DIAGNOSIS — Z87898 Personal history of other specified conditions: Secondary | ICD-10-CM

## 2023-09-06 DIAGNOSIS — R519 Headache, unspecified: Secondary | ICD-10-CM

## 2023-09-06 NOTE — Patient Instructions (Signed)
 Check your blood pressure and possibly oxygen if this occurs again.

## 2023-09-06 NOTE — Progress Notes (Signed)
 Assessment & Plan:  1. Acute nonintractable headache, unspecified headache type (Primary) Reassurance provided about resolved headache. Encouraged patient to keep a headache diary if headache returns; form provided. Consider checking blood pressure and oxygen saturation if headache returns.   Follow up plan: Return if symptoms worsen or fail to improve.  Niki Rung, MSN, APRN, FNP-C  Subjective:  HPI: [name] is a 89 y.o. female presenting on 09/06/2023 for Headache (Started last WED pm - had a ache in the top left side of head. Took 2 Tylenol  and that helped. 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  She describes it as an ache over the top left side of her head where she has a scar from an injury when she was a child.  She took 2 Tylenol  which was effective in relieving the pain.  She reports today she just does not feel right about the headache because she has never had a headache in that area before.  She denies any nausea, vomiting, dizziness, changes in vision, and any upper respiratory symptoms.  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   ROS: Negative unless specifically indicated above in HPI.   Relevant past medical history reviewed and updated as indicated.   Allergies and medications reviewed and updated.   Current Outpatient Medications:    acetaminophen  (TYLENOL ) 325 MG tablet, Take 650 mg by mouth every 6 (six) hours as needed for moderate pain., Disp: , Rfl:    amLODipine  (NORVASC ) 5 MG tablet, TAKE 1 TABLET BY MOUTH DAILY, Disp: 90 tablet, Rfl: 3   ATROVENT  HFA 17 MCG/ACT inhaler, Inhale 2 puffs into the lungs in the morning., Disp: 1 each, Rfl: 11   cloNIDine  (CATAPRES ) 0.1 MG tablet, TAKE 1 TABLET BY MOUTH TWICE DAILY AS NEEDED FOR TOP BLOOD  PRESSURE GREATER THAN 180, Disp: 180 tablet, Rfl: 1   diphenhydrAMINE  (BENADRYL  ALLERGY) 25 MG tablet, Take 1 tablet (25 mg total) by mouth every 6 (six) hours as needed for allergies (for bad allergies)., Disp: 100 tablet, Rfl: 0   dorzolamide-timolol (COSOPT) 2-0.5 % ophthalmic solution, Place 1 drop into the left eye 2 (two) times daily., Disp: , Rfl:    ezetimibe  (ZETIA ) 10 MG tablet, TAKE 1 TABLET BY MOUTH DAILY, Disp: 90 tablet, Rfl: 3   fluticasone  (FLONASE ) 50 MCG/ACT nasal spray, Place 2 sprays into both nostrils daily., Disp: 16 g, Rfl: 6   fluticasone  furoate-vilanterol (BREO ELLIPTA ) 200-25 MCG/ACT AEPB, Inhale 1 puff into the lungs every evening., Disp: 1 each, Rfl: 11   furosemide  (LASIX ) 40 MG tablet, TAKE 1 TABLET(40 MG) BY MOUTH DAILY, Disp: 90 tablet, Rfl: 1   HYDROcodone -acetaminophen  (NORCO/VICODIN) 5-325 MG tablet, Take 1 tablet by mouth every 6 (six) hours as needed for moderate pain., Disp: 15 tablet, Rfl: 0   losartan  (COZAAR ) 100 MG tablet, TAKE 1 TABLET BY MOUTH DAILY, Disp: 90 tablet, Rfl: 0   Multiple Vitamins-Minerals (MULTIVITAMIN WITH MINERALS) tablet, Take 1 tablet by mouth daily., Disp: , Rfl:    Nebivolol  HCl 20 MG TABS, TAKE 1 TABLET BY MOUTH EVERY DAY, Disp: 90 tablet, Rfl: 2   omeprazole  (PRILOSEC) 40 MG capsule, TAKE 1 CAPSULE BY MOUTH EVERY DAY, Disp: 30 capsule, Rfl: 5   prednisoLONE acetate (PRED FORTE) 1 % ophthalmic  suspension, Place 1 drop into the right eye daily., Disp: , Rfl: 0   sucralfate  (CARAFATE ) 1 g tablet, TAKE 1 TABLET(1 GRAM) BY MOUTH FOUR TIMES DAILY AT BEDTIME WITH MEALS, Disp: 120 tablet, Rfl: 5   VYZULTA 0.024 % SOLN, Place 1 drop into the left eye every morning., Disp: , Rfl:    ipratropium (ATROVENT ) 0.06 % nasal spray, Place 2 sprays into both nostrils as needed for rhinitis. (Patient not taking: Reported on 09/06/2023), Disp: 15 mL, Rfl: 5  Allergies  Allergen Reactions   Budesonide -Formoterol Fumarate     REACTION: causes her to lose  her voice   Clarinex [Desloratadine]     It made me sick   Codeine Nausea Only   Coreg  [Carvedilol ]     Blurred vision, weak   Guaifenesin     REACTION: blacks out, feels wierd   Indapamide      Felt very sick   Singulair  [Montelukast  Sodium]     Feeling bad     Objective:   BP (!) 140/64   Pulse (!) 51   Temp 98 F (36.7 C)   Resp 18   Ht 5' (1.524 m)   Wt 154 lb (69.9 kg)   SpO2 97%   BMI 30.08 kg/m    Physical Exam Vitals reviewed.  Constitutional:      General: She is not in acute distress.    Appearance: Normal appearance. She is not ill-appearing, toxic-appearing or diaphoretic.  HENT:     Head: Normocephalic and atraumatic.   Eyes:     General: No scleral icterus.       Right eye: No discharge.        Left eye: No discharge.     Conjunctiva/sclera: Conjunctivae normal.    Cardiovascular:     Rate and Rhythm: Normal rate.  Pulmonary:     Effort: Pulmonary effort is normal. No respiratory distress.   Musculoskeletal:        General: Normal range of motion.     Cervical back: Normal range of motion.   Skin:    General: Skin is warm and dry.     Capillary Refill: Capillary refill takes less than 2 seconds.   Neurological:     General: No focal deficit present.     Mental Status: She is alert and oriented to person, place, and time. Mental status is at baseline.     Motor: Motor function is intact.     Coordination: Coordination is intact.     Gait: Gait is intact.   Psychiatric:        Mood and Affect: Mood normal.        Behavior: Behavior normal.        Thought Content: Thought content normal.        Judgment: Judgment normal.

## 2023-09-10 ENCOUNTER — Encounter: Payer: Self-pay | Admitting: Family Medicine

## 2023-09-20 ENCOUNTER — Other Ambulatory Visit: Payer: Self-pay | Admitting: Internal Medicine

## 2023-10-05 ENCOUNTER — Ambulatory Visit (INDEPENDENT_AMBULATORY_CARE_PROVIDER_SITE_OTHER): Admitting: Internal Medicine

## 2023-10-05 ENCOUNTER — Other Ambulatory Visit (INDEPENDENT_AMBULATORY_CARE_PROVIDER_SITE_OTHER)

## 2023-10-05 ENCOUNTER — Ambulatory Visit: Payer: Self-pay | Admitting: Internal Medicine

## 2023-10-05 ENCOUNTER — Encounter: Payer: Self-pay | Admitting: Internal Medicine

## 2023-10-05 VITALS — BP 140/70 | HR 53 | Temp 97.8°F | Ht 60.0 in | Wt 153.0 lb

## 2023-10-05 DIAGNOSIS — G44209 Tension-type headache, unspecified, not intractable: Secondary | ICD-10-CM

## 2023-10-05 DIAGNOSIS — I10 Essential (primary) hypertension: Secondary | ICD-10-CM

## 2023-10-05 DIAGNOSIS — D649 Anemia, unspecified: Secondary | ICD-10-CM

## 2023-10-05 DIAGNOSIS — G500 Trigeminal neuralgia: Secondary | ICD-10-CM | POA: Diagnosis not present

## 2023-10-05 DIAGNOSIS — G4452 New daily persistent headache (NDPH): Secondary | ICD-10-CM | POA: Diagnosis not present

## 2023-10-05 DIAGNOSIS — J301 Allergic rhinitis due to pollen: Secondary | ICD-10-CM | POA: Insufficient documentation

## 2023-10-05 LAB — CBC WITH DIFFERENTIAL/PLATELET
Basophils Absolute: 0.1 K/uL (ref 0.0–0.1)
Basophils Relative: 0.8 % (ref 0.0–3.0)
Eosinophils Absolute: 0.5 K/uL (ref 0.0–0.7)
Eosinophils Relative: 6.8 % — ABNORMAL HIGH (ref 0.0–5.0)
HCT: 36.2 % (ref 36.0–46.0)
Hemoglobin: 11.7 g/dL — ABNORMAL LOW (ref 12.0–15.0)
Lymphocytes Relative: 34.8 % (ref 12.0–46.0)
Lymphs Abs: 2.4 K/uL (ref 0.7–4.0)
MCHC: 32.2 g/dL (ref 30.0–36.0)
MCV: 81 fl (ref 78.0–100.0)
Monocytes Absolute: 0.5 K/uL (ref 0.1–1.0)
Monocytes Relative: 7.2 % (ref 3.0–12.0)
Neutro Abs: 3.4 K/uL (ref 1.4–7.7)
Neutrophils Relative %: 50.4 % (ref 43.0–77.0)
Platelets: 285 K/uL (ref 150.0–400.0)
RBC: 4.47 Mil/uL (ref 3.87–5.11)
RDW: 15.6 % — ABNORMAL HIGH (ref 11.5–15.5)
WBC: 6.8 K/uL (ref 4.0–10.5)

## 2023-10-05 LAB — COMPREHENSIVE METABOLIC PANEL WITH GFR
ALT: 6 U/L (ref 0–35)
AST: 12 U/L (ref 0–37)
Albumin: 3.9 g/dL (ref 3.5–5.2)
Alkaline Phosphatase: 75 U/L (ref 39–117)
BUN: 9 mg/dL (ref 6–23)
CO2: 28 meq/L (ref 19–32)
Calcium: 9.2 mg/dL (ref 8.4–10.5)
Chloride: 106 meq/L (ref 96–112)
Creatinine, Ser: 0.85 mg/dL (ref 0.40–1.20)
GFR: 61.37 mL/min (ref >60.00)
Glucose, Bld: 89 mg/dL (ref 70–99)
Potassium: 3.8 meq/L (ref 3.5–5.1)
Sodium: 141 meq/L (ref 135–145)
Total Bilirubin: 0.3 mg/dL (ref 0.2–1.2)
Total Protein: 6.7 g/dL (ref 6.0–8.3)

## 2023-10-05 LAB — SEDIMENTATION RATE: Sed Rate: 23 mm/h (ref 0–30)

## 2023-10-05 NOTE — Progress Notes (Signed)
 Subjective:  Patient ID: [place] JONELLE Husband, female    DOB: 1933/12/02  Age: 89 y.o. MRN: [id number]  CC: Medical Management of Chronic Issues (Pt has Headache and is wanting a referral to Neurology... Pt states she has an old head injury from her childhood that is where the head pain/ache is in her head. Pt is asking for a head scan of some sort CT or MRI cause pts states she has been having this pain since June.)   HPI LAKEITA PANTHER presents for Medical Management of Chronic Issues (Pt has Headache and is wanting a referral to Neurology... Pt states she has an old head injury from her childhood that is where the head pain/ache is in her head. Pt is asking for a head scan of some sort CT or MRI cause pts states she has been having this pain since June x1 month. Tylenol  helps... Pain is 7/10 at times. Localized in the L frontal/parietal  area - scar is present. No HA today Last brain MRI in 2021 - ok  Outpatient Medications Prior to Visit  Medication Sig Dispense Refill   acetaminophen  (TYLENOL ) 325 MG tablet Take 650 mg by mouth every 6 (six) hours as needed for moderate pain.     amLODipine  (NORVASC ) 5 MG tablet TAKE 1 TABLET BY MOUTH DAILY 90 tablet 3   ATROVENT  HFA 17 MCG/ACT inhaler Inhale 2 puffs into the lungs in the morning. 1 each 11   cloNIDine  (CATAPRES ) 0.1 MG tablet TAKE 1 TABLET BY MOUTH TWICE DAILY AS NEEDED FOR TOP BLOOD PRESSURE GREATER THAN 180 180 tablet 1   diphenhydrAMINE  (BENADRYL  ALLERGY) 25 MG tablet Take 1 tablet (25 mg total) by mouth every 6 (six) hours as needed for allergies (for bad allergies). 100 tablet 0   dorzolamide-timolol (COSOPT) 2-0.5 % ophthalmic solution Place 1 drop into the left eye 2 (two) times daily.     ezetimibe  (ZETIA ) 10 MG tablet TAKE 1 TABLET BY MOUTH DAILY 90 tablet 3   fluticasone  (FLONASE ) 50 MCG/ACT nasal spray Place 2 sprays into both nostrils daily. 16 g 6   fluticasone  furoate-vilanterol (BREO ELLIPTA ) 200-25 MCG/ACT AEPB Inhale 1 puff  into the lungs every evening. 1 each 11   furosemide  (LASIX ) 40 MG tablet TAKE 1 TABLET(40 MG) BY MOUTH DAILY 90 tablet 1   HYDROcodone -acetaminophen  (NORCO/VICODIN) 5-325 MG tablet Take 1 tablet by mouth every 6 (six) hours as needed for moderate pain. 15 tablet 0   losartan  (COZAAR ) 100 MG tablet TAKE 1 TABLET BY MOUTH DAILY 90 tablet 0   Multiple Vitamins-Minerals (MULTIVITAMIN WITH MINERALS) tablet Take 1 tablet by mouth daily.     Nebivolol  HCl 20 MG TABS TAKE 1 TABLET BY MOUTH EVERY DAY 90 tablet 2   omeprazole  (PRILOSEC) 40 MG capsule TAKE 1 CAPSULE BY MOUTH EVERY DAY 30 capsule 5   prednisoLONE acetate (PRED FORTE) 1 % ophthalmic suspension Place 1 drop into the right eye daily.  0   sucralfate  (CARAFATE ) 1 g tablet TAKE 1 TABLET(1 GRAM) BY MOUTH FOUR TIMES DAILY AT BEDTIME WITH MEALS 120 tablet 5   VYZULTA 0.024 % SOLN Place 1 drop into the left eye every morning.     ipratropium (ATROVENT ) 0.06 % nasal spray Place 2 sprays into both nostrils as needed for rhinitis. (Patient not taking: Reported on 10/05/2023) 15 mL 5   No facility-administered medications prior to visit.    ROS: Review of Systems  Constitutional:  Negative for activity change, appetite change, chills,  fatigue and unexpected weight change.  HENT:  Negative for congestion, mouth sores and sinus pressure.   Eyes:  Negative for visual disturbance.  Respiratory:  Negative for cough and chest tightness.   Gastrointestinal:  Negative for abdominal pain and nausea.  Genitourinary:  Negative for difficulty urinating, frequency and vaginal pain.  Musculoskeletal:  Negative for back pain and gait problem.  Skin:  Negative for pallor and rash.  Neurological:  Positive for headaches. Negative for dizziness, tremors, weakness and numbness.  Psychiatric/Behavioral:  Negative for confusion and sleep disturbance.     Objective:  BP (!) 140/70   Pulse (!) 53   Temp 97.8 F (36.6 C) (Oral)   Ht 5' (1.524 m)   Wt 153 lb (69.4  kg)   SpO2 99%   BMI 29.88 kg/m   BP Readings from Last 3 Encounters:  10/05/23 (!) 140/70  09/06/23 (!) 140/64  06/01/23 118/78    Wt Readings from Last 3 Encounters:  10/05/23 153 lb (69.4 kg)  09/06/23 154 lb (69.9 kg)  06/01/23 154 lb (69.9 kg)    Physical Exam Constitutional:      General: She is not in acute distress.    Appearance: Normal appearance. She is well-developed.  HENT:     Head: Normocephalic.     Right Ear: External ear normal.     Left Ear: External ear normal.     Nose: Nose normal.  Eyes:     General:        Right eye: No discharge.        Left eye: No discharge.     Conjunctiva/sclera: Conjunctivae normal.     Pupils: Pupils are equal, round, and reactive to light.  Neck:     Thyroid : No thyromegaly.     Vascular: No JVD.     Trachea: No tracheal deviation.  Cardiovascular:     Rate and Rhythm: Normal rate and regular rhythm.     Heart sounds: Normal heart sounds.  Pulmonary:     Effort: No respiratory distress.     Breath sounds: No stridor. No wheezing.  Abdominal:     General: Bowel sounds are normal. There is no distension.     Palpations: Abdomen is soft. There is no mass.     Tenderness: There is no abdominal tenderness. There is no guarding or rebound.  Musculoskeletal:        General: No tenderness.     Cervical back: Normal range of motion and neck supple. No rigidity.  Lymphadenopathy:     Cervical: No cervical adenopathy.  Skin:    Findings: No erythema or rash.  Neurological:     Cranial Nerves: No cranial nerve deficit.     Motor: No abnormal muscle tone.     Coordination: Coordination normal.     Deep Tendon Reflexes: Reflexes normal.  Psychiatric:        Behavior: Behavior normal.        Thought Content: Thought content normal.        Judgment: Judgment normal.   Head NT No pulsating arteries  Lab Results  Component Value Date   WBC 8.2 12/23/2022   HGB 11.5 (L) 12/23/2022   HCT 36.0 12/23/2022   PLT 389.0  12/23/2022   GLUCOSE 109 (H) 12/23/2022   CHOL 182 07/02/2022   TRIG 98.0 07/02/2022   HDL 52.60 07/02/2022   LDLCALC 110 (H) 07/02/2022   ALT 8 12/23/2022   AST 13 12/23/2022   NA 143 12/23/2022  K 3.9 12/23/2022   CL 104 12/23/2022   CREATININE 1.16 12/23/2022   BUN 10 12/23/2022   CO2 33 (H) 12/23/2022   TSH 2.08 12/23/2022    DG Bone Density Result Date: 01/09/2022 Table formatting from the original result was not included. Date of study: 01/08/2022 Exam: DUAL X-RAY ABSORPTIOMETRY (DXA) FOR BONE MINERAL DENSITY (BMD) Instrument: [employer] Requesting Provider: PCP Indication: follow up for low BMD Comparison: 2018 Clinical data: Pt is a 89 y.o. female without previous history of fracture. Results:  Lumbar spine L1-L4 Femoral neck (FN) T-score -0.6 RFN: -1.3 LFN: -1.2 Change in BMD from previous DXA test (%) Up 4.1%* Down 4.4%* (*) statistically significant Assessment: By the [facility] Criteria for diagnosis based on bone density, this patient has Low Bone Density FRAX 10-year fracture risk calculator: 5.5 % for any major fracture and 1.4 % for hip fracture. Pharmacologic therapy is recommended if 10 year fracture risk is >20% for any major osteoporotic fracture or >3% for hip fracture.  Comments: the technical quality of the study is good. WHO criteria for diagnosis of osteoporosis in postmenopausal women and in men 57 y/o or older: - normal: T-score -1.0 to + 1.0 - osteopenia/low bone density: T-score between -2.5 and -1.0 - osteoporosis: T-score below -2.5 - severe osteoporosis: T-score below -2.5 with history of fragility fracture Note: although not part of the WHO classification, the presence of a fragility fracture, regardless of the T-score, should be considered diagnostic of osteoporosis, provided other causes for the fracture have been excluded. RECOMMENDATION: 1. All patients should optimize calcium and vitamin D  intake. 2. Consider FDA-approved medical therapies in postmenopausal  women and men aged 70 years and older, based on the following: a. A hip or vertebral(clinical or morphometric) fracture. b. T-Score of  -2.5 or less at the femoral neck , total hip or spine after appropriate evaluation to exclude secondary causes c. Low bone mass (T-score between -1.0 and -2.5 at the femoral neck or spine) and a 10 year probability of a hip fracture >3% or a 10 year probability of major osteoporosis-related fracture > 20% based on the [place] -adapted WHO algorithm d. Clinical judgement and/or patient preferences may indicate treatment for people with 10-year fracture probabilities above or below these levels Follow up BMD is recommended: 2 years. Interpreted by : Stefano Redgie Butts, MD [facility]    Assessment & Plan:   Problem List Items Addressed This Visit     Anemia   Check CBC      Essential hypertension   Cont on Losartan , Bystolic , Furosemide , Amlodipine  5 mg      Headache - Primary   Pt states she has an old head injury from her childhood that is where the head pain/ache is in her head. Pt is asking for a head scan of some sort CT or MRI cause pts states she has been having this pain since June x1 month. Tylenol  helps... Pain is 7/10 at times. Localized in the L frontal/parietal  area - scar is present. No HA today Last brain MRI in 2021 - ok Head CT, ESR Probable migraine HAs vs other      Relevant Orders   CT HEAD WO CONTRAST ( )   Comprehensive metabolic panel with GFR   CBC with Differential/Platelet   Sedimentation rate   Comprehensive metabolic panel with GFR   CBC with Differential/Platelet   Sedimentation rate   New daily persistent headache   Relevant Orders   CT HEAD WO CONTRAST ( )  Comprehensive metabolic panel with GFR   CBC with Differential/Platelet   Sedimentation rate   Trigeminal neuralgia of left side of face   Relevant Orders   CBC with Differential/Platelet      No orders of the defined types were placed in this  encounter.     Follow-up: No follow-ups on file.  Marolyn Noel, MD

## 2023-10-05 NOTE — Assessment & Plan Note (Signed)
Cont on Losartan, Bystolic, Furosemide, Amlodipine 5 mg

## 2023-10-05 NOTE — Assessment & Plan Note (Addendum)
 Pt states she has an old head injury from her childhood that is where the head pain/ache is in her head. Pt is asking for a head scan of some sort CT or MRI cause pts states she has been having this pain since June x1 month. Tylenol  helps... Pain is 7/10 at times. Localized in the L frontal/parietal  area - scar is present. No HA today Last brain MRI in 2021 - ok Head CT, ESR Probable migraine HAs vs other

## 2023-10-05 NOTE — Assessment & Plan Note (Signed)
 Check CBC

## 2023-10-07 ENCOUNTER — Other Ambulatory Visit: Payer: Self-pay | Admitting: Internal Medicine

## 2023-10-12 ENCOUNTER — Ambulatory Visit
Admission: RE | Admit: 2023-10-12 | Discharge: 2023-10-12 | Disposition: A | Discharge status: Home / Self Care | Source: Ambulatory Visit | Attending: Internal Medicine | Admitting: Internal Medicine

## 2023-10-12 DIAGNOSIS — G9389 Other specified disorders of brain: Secondary | ICD-10-CM | POA: Diagnosis not present

## 2023-10-12 DIAGNOSIS — G4452 New daily persistent headache (NDPH): Secondary | ICD-10-CM

## 2023-10-17 ENCOUNTER — Other Ambulatory Visit: Payer: Self-pay | Admitting: Internal Medicine

## 2023-11-16 DIAGNOSIS — Z961 Presence of intraocular lens: Secondary | ICD-10-CM | POA: Diagnosis not present

## 2023-11-16 DIAGNOSIS — H44511 Absolute glaucoma, right eye: Secondary | ICD-10-CM | POA: Diagnosis not present

## 2023-11-16 DIAGNOSIS — H401122 Primary open-angle glaucoma, left eye, moderate stage: Secondary | ICD-10-CM | POA: Diagnosis not present

## 2023-12-28 DIAGNOSIS — Z961 Presence of intraocular lens: Secondary | ICD-10-CM | POA: Diagnosis not present

## 2023-12-28 DIAGNOSIS — H44511 Absolute glaucoma, right eye: Secondary | ICD-10-CM | POA: Diagnosis not present

## 2023-12-28 DIAGNOSIS — H401122 Primary open-angle glaucoma, left eye, moderate stage: Secondary | ICD-10-CM | POA: Diagnosis not present

## 2023-12-29 DIAGNOSIS — N8189 Other female genital prolapse: Secondary | ICD-10-CM | POA: Diagnosis not present

## 2023-12-29 DIAGNOSIS — N8111 Cystocele, midline: Secondary | ICD-10-CM | POA: Diagnosis not present

## 2023-12-29 DIAGNOSIS — Z4689 Encounter for fitting and adjustment of other specified devices: Secondary | ICD-10-CM | POA: Diagnosis not present

## 2024-01-05 ENCOUNTER — Ambulatory Visit: Payer: Medicare Other

## 2024-01-05 VITALS — BP 138/80 | HR 62 | Ht 60.0 in | Wt 150.6 lb

## 2024-01-05 DIAGNOSIS — Z23 Encounter for immunization: Secondary | ICD-10-CM

## 2024-01-05 DIAGNOSIS — Z0000 Encounter for general adult medical examination without abnormal findings: Secondary | ICD-10-CM

## 2024-01-05 NOTE — Patient Instructions (Addendum)
 [name],  Thank you for taking the time for your Medicare Wellness Visit. I appreciate your continued commitment to your health goals. Please review the care plan we discussed, and feel free to reach out if I can assist you further.  Medicare recommends these wellness visits once per year to help you and your care team stay ahead of potential health issues. These visits are designed to focus on prevention, allowing your provider to concentrate on managing your acute and chronic conditions during your regular appointments.  Please note that Annual Wellness Visits do not include a physical exam. Some assessments may be limited, especially if the visit was conducted virtually. If needed, we may recommend a separate in-person follow-up with your provider.  Ongoing Care Seeing your primary care provider every 3 to 6 months helps us  monitor your health and provide consistent, personalized care.   Referrals If a referral was made during today's visit and you haven't received any updates within two weeks, please contact the referred provider directly to check on the status.  Recommended Screenings:  Health Maintenance  Topic Date Due   Zoster (Shingles) Vaccine (1 of 2) 10/18/1985   COVID-19 Vaccine (6 - 2025-26 season) 11/15/2023   Medicare Annual Wellness Visit  01/04/2025   DEXA scan (bone density measurement)  01/09/2027   DTaP/Tdap/Td vaccine (3 - Td or Tdap) 09/08/2032   Pneumococcal Vaccine for age over 31  Completed   Flu Shot  Completed   Meningitis B Vaccine  Aged Out       01/04/2023    1:44 PM  Advanced Directives  Does Patient Have a Medical Advance Directive? No  Would patient like information on creating a medical advance directive? No - Patient declined   Advance Care Planning is important because it: Ensures you receive medical care that aligns with your values, goals, and preferences. Provides guidance to your family and loved ones, reducing the emotional burden of  decision-making during critical moments.  Vision: Annual vision screenings are recommended for early detection of glaucoma, cataracts, and diabetic retinopathy. These exams can also reveal signs of chronic conditions such as diabetes and high blood pressure.  Dental: Annual dental screenings help detect early signs of oral cancer, gum disease, and other conditions linked to overall health, including heart disease and diabetes.

## 2024-01-05 NOTE — Progress Notes (Cosign Needed Addendum)
 Subjective:   [name] is a 89 y.o. who presents for a Medicare Wellness preventive visit.  As a reminder, Annual Wellness Visits don't include a physical exam, and some assessments may be limited, especially if this visit is performed virtually. We may recommend an in-person follow-up visit with your provider if needed.  Visit Complete: In person  Persons Participating in Visit: Patient.  AWV Questionnaire: No: Patient Medicare AWV questionnaire was not completed prior to this visit.  Cardiac Risk Factors include: advanced age (>60men, >13 women);dyslipidemia;hypertension     Objective:    Today's Vitals   01/05/24 1300  BP: 138/80  Pulse: 62  Weight: 150 lb 9.6 oz (68.3 kg)  Height: 5' (1.524 m)   Body mass index is 29.41 kg/m.     01/04/2023    1:44 PM 01/01/2022    1:34 PM 12/31/2020   10:34 AM 06/06/2020   10:25 AM 08/31/2019   11:54 AM 03/28/2018   11:18 AM 09/02/2016   10:28 AM  Advanced Directives  Does Patient Have a Medical Advance Directive? No No Yes No No No  Yes   Type of Advance Directive   Living will    Healthcare Power of [place];Living will  Does patient want to make changes to medical advance directive?   No - Patient declined  No - Patient declined Yes (ED - Information included in AVS)    Copy of Healthcare Power of Attorney in Chart?       No - copy requested   Would patient like information on creating a medical advance directive? No - Patient declined No - Patient declined          Data saved with a previous flowsheet row definition    Current Medications (verified) Outpatient Encounter Medications as of 01/05/2024  Medication Sig   acetaminophen  (TYLENOL ) 325 MG tablet Take 650 mg by mouth every 6 (six) hours as needed for moderate pain.   amLODipine  (NORVASC ) 5 MG tablet TAKE 1 TABLET BY MOUTH DAILY   ATROVENT  HFA 17 MCG/ACT inhaler Inhale 2 puffs into the lungs in the morning.   brimonidine (ALPHAGAN P) 0.1 % SOLN Place 0.1 drops  into the left eye in the morning and at bedtime.   cloNIDine  (CATAPRES ) 0.1 MG tablet TAKE 1 TABLET BY MOUTH TWICE DAILY AS NEEDED FOR TOP BLOOD PRESSURE GREATER THAN 180   diphenhydrAMINE  (BENADRYL  ALLERGY) 25 MG tablet Take 1 tablet (25 mg total) by mouth every 6 (six) hours as needed for allergies (for bad allergies).   dorzolamide-timolol (COSOPT) 2-0.5 % ophthalmic solution Place 1 drop into the left eye 2 (two) times daily.   ezetimibe  (ZETIA ) 10 MG tablet TAKE 1 TABLET BY MOUTH DAILY   fluticasone  (FLONASE ) 50 MCG/ACT nasal spray Place 2 sprays into both nostrils daily.   fluticasone  furoate-vilanterol (BREO ELLIPTA ) 200-25 MCG/ACT AEPB Inhale 1 puff into the lungs every evening.   furosemide  (LASIX ) 40 MG tablet TAKE 1 TABLET(40 MG) BY MOUTH DAILY   Latanoprostene Bunod (VYZULTA) 0.024 % SOLN Apply 0.024 mLs to eye in the morning.   losartan  (COZAAR ) 100 MG tablet TAKE 1 TABLET BY MOUTH DAILY   Multiple Vitamins-Minerals (MULTIVITAMIN WITH MINERALS) tablet Take 1 tablet by mouth daily.   Nebivolol  HCl 20 MG TABS TAKE 1 TABLET BY MOUTH EVERY DAY   omeprazole  (PRILOSEC) 40 MG capsule TAKE 1 CAPSULE BY MOUTH EVERY DAY   prednisoLONE acetate (PRED FORTE) 1 % ophthalmic suspension Place 1 drop into the right  eye daily.   prednisoLONE acetate (PRED FORTE) 1 % ophthalmic suspension Place 1 drop into the right eye once.   sucralfate  (CARAFATE ) 1 g tablet TAKE 1 TABLET(1 GRAM) BY MOUTH FOUR TIMES DAILY AT BEDTIME WITH MEALS   VYZULTA 0.024 % SOLN Place 1 drop into the left eye every morning.   HYDROcodone -acetaminophen  (NORCO/VICODIN) 5-325 MG tablet Take 1 tablet by mouth every 6 (six) hours as needed for moderate pain. (Patient not taking: Reported on 01/05/2024)   ipratropium (ATROVENT ) 0.06 % nasal spray Place 2 sprays into both nostrils as needed for rhinitis. (Patient not taking: Reported on 01/05/2024)   No facility-administered encounter medications on file as of 01/05/2024.     Allergies (verified) Budesonide -formoterol fumarate, Clarinex [desloratadine], Codeine, Coreg  [carvedilol ], Guaifenesin, Indapamide , and Singulair  [montelukast  sodium]   History: Past Medical History:  Diagnosis Date   Allergy    Allergy, unspecified not elsewhere classified    Anxiety    pt denies    Asthma    Cataract    bil cataracts removed   Diverticulitis    Diverticulosis of colon (without mention of hemorrhage)    DJD (degenerative joint disease)    Gastritis    GERD (gastroesophageal reflux disease)    Glaucoma    History of GI diverticular bleed    HTN (hypertension)    Hyperlipidemia    LPRD (laryngopharyngeal reflux disease)    Past Surgical History:  Procedure Laterality Date   BLADDER REPAIR     after perforation   cataract surgery  2011   w/ IOL [facility])   COLONOSCOPY     KIDNEY SURGERY     KNEE ARTHROSCOPY  1998   left   UPPER GASTROINTESTINAL ENDOSCOPY     VENTRAL HERNIA REPAIR N/A 06/10/2020   Procedure: LAPAROSCOPIC VENTRAL HERNIA REPAIR;  Surgeon: Gladis Cough, MD;  Location: [facility];  Service: General;  Laterality: N/A;  1.5 HOUR CASE PLEASE   WISDOM TOOTH EXTRACTION     Family History  Problem Relation Age of Onset   Heart disease Father        ??   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 Married '56 - '87, widowed. 2 sons > '53, '62. 2 daughters > '59, '61. 9 granddaughters. 12 great-grandchildren. Lives in own home and son lives with her.  Retired.   ACP - asked but not answered (June '14). Referred to [email or web address]   Social Drivers of  Health   Financial Resource Strain: Low Risk  (01/05/2024)   Overall Financial Resource Strain (CARDIA)    Difficulty of Paying Living Expenses: Not hard at all  Food Insecurity: No Food Insecurity (01/05/2024)   Hunger Vital Sign    Worried About Running Out of Food in the Last Year: Never true    Ran Out of Food in the Last Year: Never true  Transportation Needs: No Transportation Needs (01/05/2024)   PRAPARE - Administrator, Civil Service (Medical): No    Lack of Transportation (Non-Medical): No  Physical Activity: Sufficiently Active (01/05/2024)   Exercise Vital Sign    Days of Exercise per Week: 5 days    Minutes of Exercise per Session: 30 min  Stress: No Stress Concern Present (01/05/2024)   [employer] of Occupational Health - Occupational Stress Questionnaire    Feeling of Stress: Not at all  Social Connections: Socially Integrated (01/05/2024)   Social Connection and Isolation Panel    Frequency of Communication with Friends and Family: More than three times a week    Frequency of Social Gatherings with Friends and Family: More than three times a week    Attends Religious Services: More than 4 times per year    Active Member of [employer] or Organizations: Yes    Attends Engineer, Structural: More than 4 times per year    Marital Status: Married    Tobacco Counseling Counseling given: Not Answered    Clinical Intake:  Pre-visit preparation completed: Yes  Pain : No/denies pain     BMI - recorded: 29.41 Nutritional Status: BMI  25 -29 Overweight Nutritional Risks: None Diabetes: No  No results found for: HGBA1C   How often do you need to have someone help you when you read instructions, pamphlets, or other written materials from your doctor or pharmacy?: 1 - Never  Interpreter Needed?: No  Information entered by :: Verdie Saba, CMA   Activities of Daily Living     01/05/2024    1:08 PM  In your present state of health, do you have any difficulty performing the following activities:  Hearing? 0  Vision? 0  Difficulty concentrating or making decisions? 0  Walking or climbing stairs? 0  Dressing or bathing? 0  Doing errands, shopping? 0  Preparing Food and eating ? N  Using the Toilet? N  In the past six months, have you accidently leaked urine? N  Do you have problems with loss of bowel control? N  Managing your Medications? N  Managing your Finances? N  Housekeeping or managing your Housekeeping? N    Patient Care Team: Plotnikov, Karlynn GAILS, MD as PCP - General (Internal Medicine) Avram Lupita BRAVO, MD as Consulting Physician (Gastroenterology) Roz Anes, MD as Consulting Physician (Ophthalmology) Octavia, Charlie Hamilton, MD as Consulting Physician (Ophthalmology)  I have updated your Care Teams any recent Medical Services you may have received from other providers in the past year.     Assessment:   This is a routine wellness examination for Zaakirah.  Hearing/Vision screen Hearing Screening - Comments:: Denies hearing difficulties   Vision Screening - Comments:: Wears rx glasses - up to date with routine eye exams with [facility]   Goals Addressed               This Visit's Progress     Patient Stated (pt-stated)        Patient stated she plans to continue to manage her health and bp  reading       Depression Screen     01/05/2024    1:08 PM 09/06/2023    2:13 PM 01/04/2023    1:42 PM 07/02/2022    1:31 PM 01/01/2022    1:41 PM 12/31/2020    9:58 AM 08/31/2019   11:55  AM  PHQ 2/9 Scores  PHQ - 2 Score 0 0 0 0 0 0 0  PHQ- 9 Score 0  0 0       Fall Risk     01/05/2024    1:08 PM 09/06/2023    2:13 PM 04/15/2023    4:08 PM 01/04/2023    1:43 PM 07/02/2022    1:31 PM  Fall Risk   Falls in the past year? 0 0 0 0   Number falls in past yr: 0 0 0 0 0  Injury with Fall? 0 0 0 0 0  Risk for fall due to : No Fall Risks No Fall Risks No Fall Risks No Fall Risks No Fall Risks  Follow up Falls evaluation completed;Falls prevention discussed Falls evaluation completed Falls evaluation completed Falls prevention discussed Falls evaluation completed    MEDICARE RISK AT HOME:  Medicare Risk at Home Any stairs in or around the home?: No If so, are there any without handrails?: No Home free of loose throw rugs in walkways, pet beds, electrical cords, etc?: Yes Adequate lighting in your home to reduce risk of falls?: Yes Life alert?: Yes (alarm system) Use of a cane, walker or w/c?: No Grab bars in the bathroom?: Yes Shower chair or bench in shower?: Yes Elevated toilet seat or a handicapped toilet?: Yes  TIMED UP AND GO:  Was the test performed?  No  Cognitive Function: 6CIT completed    01/04/2023    1:48 PM 08/31/2019   11:57 AM 09/02/2016   10:36 AM  MMSE - Mini Mental State Exam  Not completed: Unable to complete Refused   Orientation to time   5   Orientation to Place   5   Registration   3   Attention/ Calculation   5   Recall   2   Language- name 2 objects   2   Language- repeat   1  Language- follow 3 step command   3   Language- read & follow direction   1   Write a sentence   1   Copy design   1   Total score   29      Data saved with a previous flowsheet row definition        01/05/2024    1:10 PM 01/04/2023    1:43 PM 01/01/2022    1:36 PM  6CIT Screen  What Year? 0 points 0 points 0 points  What month? 0 points 0 points 0 points  What time? 0 points 0 points 0 points  Count back from 20 0 points 0 points 0 points  Months  in reverse 0 points 0 points 0 points  Repeat phrase 0 points 0 points 0 points  Total Score 0 points 0 points 0 points    Immunizations Immunization History  Administered Date(s) Administered   Fluad Quad(high Dose 65+) 11/19/2018, 12/18/2019, 12/31/2020, 01/06/2022   Fluad Trivalent(High Dose 65+) 12/23/2022   INFLUENZA, HIGH DOSE SEASONAL PF 12/19/2012, 01/09/2016, 01/06/2017, 12/21/2017, 05/16/2020, 01/05/2024   Influenza Split 11/26/2010, 01/25/2012   Influenza Whole 12/12/2007, 01/28/2009, 11/14/2009   Influenza,inj,Quad PF,6+ Mos 12/06/2014   Influenza-Unspecified 12/23/2013  PFIZER(Purple Top)SARS-COV-2 Vaccination 05/11/2019, 06/08/2019, 11/02/2019, 03/21/2020, 05/16/2020   Pneumococcal Conjugate-13 05/30/2014   Pneumococcal Polysaccharide-23 06/17/2002, 09/05/2015   Td 08/20/2011   Tdap 09/09/2022   Zoster, Live 10/13/2011, 09/29/2012    Screening Tests Health Maintenance  Topic Date Due   Zoster Vaccines- Shingrix (1 of 2) 10/18/1985   COVID-19 Vaccine (6 - 2025-26 season) 11/15/2023   Medicare Annual Wellness (AWV)  01/04/2025   DEXA SCAN  01/09/2027   DTaP/Tdap/Td (3 - Td or Tdap) 09/08/2032   Pneumococcal Vaccine: 50+ Years  Completed   Influenza Vaccine  Completed   Meningococcal B Vaccine  Aged Out    Health Maintenance Items Addressed:  Vaccines Given today: Influenza High-Dose  Additional Screening:  Vision Screening: Recommended annual ophthalmology exams for early detection of glaucoma and other disorders of the eye. Is the patient up to date with their annual eye exam?  Yes  Who is the provider or what is the name of the office in which the patient attends annual eye exams? Lonni Gaudy of [facility]  Dental Screening: Recommended annual dental exams for proper oral hygiene  Community Resource Referral / Chronic Care Management: CRR required this visit?  No   CCM required this visit?  No   Plan:    I have personally reviewed and  noted the following in the patient's chart:   Medical and social history Use of alcohol, tobacco or illicit drugs  Current medications and supplements including opioid prescriptions. Patient is not currently taking opioid prescriptions. Functional ability and status Nutritional status Physical activity Advanced directives List of other physicians Hospitalizations, surgeries, and ER visits in previous 12 months Vitals Screenings to include cognitive, depression, and falls Referrals and appointments  In addition, I have reviewed and discussed with patient certain preventive protocols, quality metrics, and best practice recommendations. A written personalized care plan for preventive services as well as general preventive health recommendations were provided to patient.   Verdie CHRISTELLA Saba, CMA   01/05/2024   After Visit Summary: (In Person-Declined) Patient declined AVS at this time.  Notes: Scheduled a 1-yr Physical w/PCP for 01/2024  Medical screening examination/treatment/procedure(s) were performed by non-physician practitioner and as supervising physician I was immediately available for consultation/collaboration.  I agree with above. Karlynn Noel, MD

## 2024-01-07 ENCOUNTER — Telehealth: Payer: Self-pay

## 2024-01-07 NOTE — Telephone Encounter (Signed)
 Copied from CRM [id number]. Topic: Appointments - Scheduling Inquiry for Clinic >> Jan 07, 2024 11:23 AM Anairis L wrote: Reason for CRM: Patient called in to schedule regular physical with Dr. Garald, unable to pull schedule, only pulling AWV which is already scheduled. Please call patient to schedule app.   Thank you

## 2024-01-20 ENCOUNTER — Ambulatory Visit (INDEPENDENT_AMBULATORY_CARE_PROVIDER_SITE_OTHER): Admitting: Internal Medicine

## 2024-01-20 ENCOUNTER — Encounter: Admitting: Internal Medicine

## 2024-01-20 ENCOUNTER — Encounter: Payer: Self-pay | Admitting: Internal Medicine

## 2024-01-20 VITALS — BP 156/78 | HR 57 | Temp 98.0°F | Ht 60.0 in | Wt 150.2 lb

## 2024-01-20 DIAGNOSIS — M8580 Other specified disorders of bone density and structure, unspecified site: Secondary | ICD-10-CM

## 2024-01-20 DIAGNOSIS — I10 Essential (primary) hypertension: Secondary | ICD-10-CM | POA: Diagnosis not present

## 2024-01-20 DIAGNOSIS — D649 Anemia, unspecified: Secondary | ICD-10-CM | POA: Diagnosis not present

## 2024-01-20 DIAGNOSIS — J45909 Unspecified asthma, uncomplicated: Secondary | ICD-10-CM

## 2024-01-20 DIAGNOSIS — F419 Anxiety disorder, unspecified: Secondary | ICD-10-CM | POA: Diagnosis not present

## 2024-01-20 DIAGNOSIS — E559 Vitamin D deficiency, unspecified: Secondary | ICD-10-CM

## 2024-01-20 DIAGNOSIS — E785 Hyperlipidemia, unspecified: Secondary | ICD-10-CM

## 2024-01-20 DIAGNOSIS — R29898 Other symptoms and signs involving the musculoskeletal system: Secondary | ICD-10-CM | POA: Diagnosis not present

## 2024-01-20 DIAGNOSIS — M25561 Pain in right knee: Secondary | ICD-10-CM | POA: Diagnosis not present

## 2024-01-20 DIAGNOSIS — G8929 Other chronic pain: Secondary | ICD-10-CM

## 2024-01-20 DIAGNOSIS — R202 Paresthesia of skin: Secondary | ICD-10-CM

## 2024-01-20 LAB — CBC WITH DIFFERENTIAL/PLATELET
Basophils Absolute: 0.1 K/uL (ref 0.0–0.1)
Basophils Relative: 0.7 % (ref 0.0–3.0)
Eosinophils Absolute: 0.7 K/uL (ref 0.0–0.7)
Eosinophils Relative: 10.1 % — ABNORMAL HIGH (ref 0.0–5.0)
HCT: 36.7 % (ref 36.0–46.0)
Hemoglobin: 11.9 g/dL — ABNORMAL LOW (ref 12.0–15.0)
Lymphocytes Relative: 31 % (ref 12.0–46.0)
Lymphs Abs: 2.3 K/uL (ref 0.7–4.0)
MCHC: 32.5 g/dL (ref 30.0–36.0)
MCV: 81.2 fl (ref 78.0–100.0)
Monocytes Absolute: 0.6 K/uL (ref 0.1–1.0)
Monocytes Relative: 7.8 % (ref 3.0–12.0)
Neutro Abs: 3.7 K/uL (ref 1.4–7.7)
Neutrophils Relative %: 50.4 % (ref 43.0–77.0)
Platelets: 288 K/uL (ref 150.0–400.0)
RBC: 4.51 Mil/uL (ref 3.87–5.11)
RDW: 15 % (ref 11.5–15.5)
WBC: 7.4 K/uL (ref 4.0–10.5)

## 2024-01-20 LAB — URINALYSIS, ROUTINE W REFLEX MICROSCOPIC
Bilirubin Urine: NEGATIVE
Ketones, ur: NEGATIVE
Nitrite: NEGATIVE
RBC / HPF: NONE SEEN (ref 0–?)
Specific Gravity, Urine: 1.01 (ref 1.000–1.030)
Total Protein, Urine: NEGATIVE
Urine Glucose: NEGATIVE
Urobilinogen, UA: 0.2 (ref 0.0–1.0)
pH: 7 (ref 5.0–8.0)

## 2024-01-20 LAB — VITAMIN B12: Vitamin B-12: 204 pg/mL — ABNORMAL LOW (ref 211–911)

## 2024-01-20 LAB — T4, FREE: Free T4: 0.65 ng/dL (ref 0.60–1.60)

## 2024-01-20 LAB — COMPREHENSIVE METABOLIC PANEL WITH GFR
ALT: 10 U/L (ref 0–35)
AST: 16 U/L (ref 0–37)
Albumin: 3.8 g/dL (ref 3.5–5.2)
Alkaline Phosphatase: 83 U/L (ref 39–117)
BUN: 12 mg/dL (ref 6–23)
CO2: 29 meq/L (ref 19–32)
Calcium: 9.3 mg/dL (ref 8.4–10.5)
Chloride: 104 meq/L (ref 96–112)
Creatinine, Ser: 0.8 mg/dL (ref 0.40–1.20)
GFR: 65.86 mL/min (ref >60.00)
Glucose, Bld: 80 mg/dL (ref 70–99)
Potassium: 3.6 meq/L (ref 3.5–5.1)
Sodium: 141 meq/L (ref 135–145)
Total Bilirubin: 0.4 mg/dL (ref 0.2–1.2)
Total Protein: 6.9 g/dL (ref 6.0–8.3)

## 2024-01-20 LAB — LIPID PANEL
Cholesterol: 163 mg/dL (ref 0–200)
HDL: 48.6 mg/dL (ref >39.00)
LDL Cholesterol: 93 mg/dL (ref 0–99)
NonHDL: 114.45
Total CHOL/HDL Ratio: 3
Triglycerides: 106 mg/dL (ref 0.0–149.0)
VLDL: 21.2 mg/dL (ref 0.0–40.0)

## 2024-01-20 MED ORDER — FLUTICASONE PROPIONATE 50 MCG/ACT NA SUSP: 2.0000 | Freq: Every day | NASAL | 6 refills | Status: AC

## 2024-01-20 NOTE — Assessment & Plan Note (Signed)
 Monitor CBC

## 2024-01-20 NOTE — Progress Notes (Signed)
 Subjective:  Patient ID: [place] JONELLE Husband, female    DOB: December 13, 1933  Age: 89 y.o. MRN: [id number]  CC: Annual Exam (Annual Exam)   HPI MIRRIAM VADALA presents for allergies, HTN, GERD  Outpatient Medications Prior to Visit  Medication Sig Dispense Refill   acetaminophen  (TYLENOL ) 325 MG tablet Take 650 mg by mouth every 6 (six) hours as needed for moderate pain.     amLODipine  (NORVASC ) 5 MG tablet TAKE 1 TABLET BY MOUTH DAILY 90 tablet 3   ATROVENT  HFA 17 MCG/ACT inhaler Inhale 2 puffs into the lungs in the morning. 1 each 11   brimonidine (ALPHAGAN P) 0.1 % SOLN Place 0.1 drops into the left eye in the morning and at bedtime.     cloNIDine  (CATAPRES ) 0.1 MG tablet TAKE 1 TABLET BY MOUTH TWICE DAILY AS NEEDED FOR TOP BLOOD PRESSURE GREATER THAN 180 180 tablet 1   diphenhydrAMINE  (BENADRYL  ALLERGY) 25 MG tablet Take 1 tablet (25 mg total) by mouth every 6 (six) hours as needed for allergies (for bad allergies). 100 tablet 0   dorzolamide-timolol (COSOPT) 2-0.5 % ophthalmic solution Place 1 drop into the left eye 2 (two) times daily.     ezetimibe  (ZETIA ) 10 MG tablet TAKE 1 TABLET BY MOUTH DAILY 90 tablet 3   fluticasone  furoate-vilanterol (BREO ELLIPTA ) 200-25 MCG/ACT AEPB Inhale 1 puff into the lungs every evening. 1 each 11   furosemide  (LASIX ) 40 MG tablet TAKE 1 TABLET(40 MG) BY MOUTH DAILY 90 tablet 1   Latanoprostene Bunod (VYZULTA) 0.024 % SOLN Apply 0.024 mLs to eye in the morning.     losartan  (COZAAR ) 100 MG tablet TAKE 1 TABLET BY MOUTH DAILY 90 tablet 3   Multiple Vitamins-Minerals (MULTIVITAMIN WITH MINERALS) tablet Take 1 tablet by mouth daily.     Nebivolol  HCl 20 MG TABS TAKE 1 TABLET BY MOUTH EVERY DAY 90 tablet 2   omeprazole  (PRILOSEC) 40 MG capsule TAKE 1 CAPSULE BY MOUTH EVERY DAY 90 capsule 3   prednisoLONE acetate (PRED FORTE) 1 % ophthalmic suspension Place 1 drop into the right eye daily.  0   prednisoLONE acetate (PRED FORTE) 1 % ophthalmic suspension Place 1  drop into the right eye once.     sucralfate  (CARAFATE ) 1 g tablet TAKE 1 TABLET(1 GRAM) BY MOUTH FOUR TIMES DAILY AT BEDTIME WITH MEALS 120 tablet 5   VYZULTA 0.024 % SOLN Place 1 drop into the left eye every morning.     fluticasone  (FLONASE ) 50 MCG/ACT nasal spray Place 2 sprays into both nostrils daily. 16 g 6   HYDROcodone -acetaminophen  (NORCO/VICODIN) 5-325 MG tablet Take 1 tablet by mouth every 6 (six) hours as needed for moderate pain. (Patient not taking: Reported on 01/20/2024) 15 tablet 0   ipratropium (ATROVENT ) 0.06 % nasal spray Place 2 sprays into both nostrils as needed for rhinitis. (Patient not taking: Reported on 01/20/2024) 15 mL 5   No facility-administered medications prior to visit.    ROS: Review of Systems  Constitutional:  Positive for fatigue. Negative for activity change, appetite change, chills and unexpected weight change.  HENT:  Positive for congestion. Negative for mouth sores and sinus pressure.   Eyes:  Negative for visual disturbance.  Respiratory:  Negative for cough and chest tightness.   Gastrointestinal:  Negative for abdominal pain and nausea.  Genitourinary:  Negative for difficulty urinating, frequency and vaginal pain.  Musculoskeletal:  Positive for arthralgias. Negative for back pain and gait problem.  Skin:  Negative for  pallor and rash.  Neurological:  Negative for dizziness, tremors, weakness, numbness and headaches.  Psychiatric/Behavioral:  Negative for confusion and sleep disturbance. The patient is not nervous/anxious.     Objective:  BP (!) 156/78   Pulse (!) 57   Temp 98 F (36.7 C)   Ht 5' (1.524 m)   Wt 150 lb 3.2 oz (68.1 kg)   SpO2 97%   BMI 29.33 kg/m   BP Readings from Last 3 Encounters:  01/20/24 (!) 156/78  01/05/24 138/80  10/05/23 (!) 140/70    Wt Readings from Last 3 Encounters:  01/20/24 150 lb 3.2 oz (68.1 kg)  01/05/24 150 lb 9.6 oz (68.3 kg)  10/05/23 153 lb (69.4 kg)    Physical Exam Constitutional:       General: She is not in acute distress.    Appearance: She is well-developed. She is obese.  HENT:     Head: Normocephalic.     Right Ear: External ear normal.     Left Ear: External ear normal.     Nose: Nose normal.  Eyes:     General:        Right eye: No discharge.        Left eye: No discharge.     Conjunctiva/sclera: Conjunctivae normal.     Pupils: Pupils are equal, round, and reactive to light.  Neck:     Thyroid : No thyromegaly.     Vascular: No JVD.     Trachea: No tracheal deviation.  Cardiovascular:     Rate and Rhythm: Normal rate and regular rhythm.     Heart sounds: Normal heart sounds.  Pulmonary:     Effort: No respiratory distress.     Breath sounds: No stridor. No wheezing.  Abdominal:     General: Bowel sounds are normal. There is no distension.     Palpations: Abdomen is soft. There is no mass.     Tenderness: There is no abdominal tenderness. There is no guarding or rebound.  Musculoskeletal:        General: No tenderness.     Cervical back: Normal range of motion and neck supple. No rigidity.  Lymphadenopathy:     Cervical: No cervical adenopathy.  Skin:    Findings: No erythema or rash.  Neurological:     Mental Status: She is oriented to person, place, and time.     Cranial Nerves: No cranial nerve deficit.     Motor: No abnormal muscle tone.     Coordination: Coordination normal.     Gait: Gait normal.     Deep Tendon Reflexes: Reflexes normal.  Psychiatric:        Behavior: Behavior normal.        Thought Content: Thought content normal.        Judgment: Judgment normal.     Lab Results  Component Value Date   WBC 6.8 10/05/2023   HGB 11.7 (L) 10/05/2023   HCT 36.2 10/05/2023   PLT 285.0 10/05/2023   GLUCOSE 89 10/05/2023   CHOL 182 07/02/2022   TRIG 98.0 07/02/2022   HDL 52.60 07/02/2022   LDLCALC 110 (H) 07/02/2022   ALT 6 10/05/2023   AST 12 10/05/2023   NA 141 10/05/2023   K 3.8 10/05/2023   CL 106 10/05/2023    CREATININE 0.85 10/05/2023   BUN 9 10/05/2023   CO2 28 10/05/2023   TSH 2.08 12/23/2022    CT HEAD WO CONTRAST ( ) Result Date: 10/12/2023 CLINICAL DATA:  Headache, left frontal/parietal region almost daily for 1-2 months. EXAM: CT HEAD WITHOUT CONTRAST TECHNIQUE: Contiguous axial images were obtained from the base of the skull through the vertex without intravenous contrast. RADIATION DOSE REDUCTION: This exam was performed according to the departmental dose-optimization program which includes automated exposure control, adjustment of the mA and/or kV according to patient size and/or use of iterative reconstruction technique. COMPARISON:  MRI head 11/16/2019. FINDINGS: Brain: No acute intracranial hemorrhage. No CT evidence of acute infarct. Nonspecific hypoattenuation in the periventricular and subcortical white matter favored to reflect chronic microvascular ischemic changes. Small foci of encephalomalacia in the bilateral occipital cortices concerning for small remote cortical infarcts. No edema, mass effect, or midline shift. The basilar cisterns are patent. Ventricles: The ventricles are normal. Vascular: Atherosclerotic calcifications of the carotid siphons. No hyperdense vessel. Skull: No acute or aggressive finding. Orbits: Left lens replacement.  Orbits otherwise unremarkable. Sinuses: The visualized paranasal sinuses are clear. Other: Mastoid air cells are clear. IMPRESSION: No CT evidence of acute intracranial abnormality. Encephalomalacia in the bilateral occipital lobes concerning for small remote cortical infarcts. Chronic microvascular ischemic changes. Electronically Signed   By: Donnice Mania M.D.   On: 10/12/2023 14:57    Assessment & Plan:   Problem List Items Addressed This Visit     Anemia   Monitor CBC      Relevant Orders   Comprehensive metabolic panel with GFR   CBC with Differential/Platelet   T4, free   Urinalysis   Lipid panel   Vitamin B12   Anxiety disorder    Occasional Dtr w/MS in [facility]      Relevant Orders   Comprehensive metabolic panel with GFR   CBC with Differential/Platelet   T4, free   Urinalysis   Lipid panel   Vitamin B12   Asthma   Cont on Breo      Relevant Orders   Comprehensive metabolic panel with GFR   CBC with Differential/Platelet   T4, free   Urinalysis   Lipid panel   Vitamin B12   Dyslipidemia   Relevant Orders   Lipid panel   Essential hypertension - Primary   Cont on Losartan , Bystolic , Furosemide , Amlodipine  5 mg      Hand weakness   H/o CTS x 30 years No change       Relevant Orders   Comprehensive metabolic panel with GFR   CBC with Differential/Platelet   T4, free   Urinalysis   Lipid panel   Vitamin B12   Knee pain, chronic   Blue-Emu cream was recommended to use 2-3 times a day       Osteopenia   On Vit D      Vitamin D  deficiency   On Vit D      Other Visit Diagnoses       Paresthesia       Relevant Orders   Vitamin B12         Meds ordered this encounter  Medications   fluticasone  (FLONASE ) 50 MCG/ACT nasal spray    Sig: Place 2 sprays into both nostrils daily.    Dispense:  16 g    Refill:  6      Follow-up: Return in about 6 months (around 07/19/2024) for a follow-up visit.  Marolyn Noel, MD

## 2024-01-20 NOTE — Assessment & Plan Note (Signed)
 H/o CTS x 30 years No change

## 2024-01-20 NOTE — Assessment & Plan Note (Signed)
 Occasional Dtr w/MS in [place]

## 2024-01-20 NOTE — Assessment & Plan Note (Signed)
 On Vit D

## 2024-01-20 NOTE — Assessment & Plan Note (Signed)
 Cont on [employer]

## 2024-01-20 NOTE — Assessment & Plan Note (Signed)
Cont on Losartan, Bystolic, Furosemide, Amlodipine 5 mg

## 2024-01-20 NOTE — Assessment & Plan Note (Signed)
Blue-Emu cream was recommended to use 2-3 times a day ? ?

## 2024-01-21 ENCOUNTER — Ambulatory Visit: Payer: Self-pay | Admitting: Internal Medicine

## 2024-01-21 DIAGNOSIS — E538 Deficiency of other specified B group vitamins: Secondary | ICD-10-CM | POA: Insufficient documentation

## 2024-01-21 MED ORDER — VITAMIN B-12 1000 MCG SL SUBL: 1.0000 | SUBLINGUAL_TABLET | Freq: Every day | SUBLINGUAL | 3 refills | Status: AC

## 2024-01-27 ENCOUNTER — Ambulatory Visit: Admitting: Internal Medicine

## 2024-02-16 ENCOUNTER — Telehealth: Payer: Self-pay

## 2024-02-16 NOTE — Telephone Encounter (Unsigned)
 Copied from CRM [id number]. Topic: Clinical - Medication Question >> Feb 16, 2024  2:47 PM Shanda MATSU wrote: Reason for CRM: Patient called to check status of refill req for med, Nebivolol  HCl 20 MG TABS, patient stated pharmacy adv her that a response was not recvd from the provider, adv patient that I show med has been discontinued, patient was not aware of this and is wantinf to know why, is req a call back. >> Feb 16, 2024  2:50 PM Shanda MATSU wrote: Patient stated she is completely out of medication

## 2024-02-17 ENCOUNTER — Other Ambulatory Visit: Payer: Self-pay

## 2024-02-17 MED ORDER — NEBIVOLOL HCL 20 MG PO TABS: 1.0000 | ORAL_TABLET | Freq: Every day | ORAL | 0 refills | Status: DC

## 2024-02-17 NOTE — Telephone Encounter (Signed)
 Patient called in stating that she needs a refill on this medication. She is completely out. She spoke with someone told that the medication was was no longer on her med list,however it is.

## 2024-02-18 NOTE — Telephone Encounter (Signed)
 Called and informed patient that the prescription had since been filled

## 2024-04-03 ENCOUNTER — Other Ambulatory Visit: Payer: Self-pay | Admitting: Internal Medicine

## 2024-04-03 ENCOUNTER — Telehealth: Payer: Self-pay

## 2024-04-03 NOTE — Telephone Encounter (Signed)
 Copied from CRM [id number]. Topic: General - Other >> Apr 03, 2024  3:40 PM Macario HERO wrote: Reason for CRM: Patient called requesting a call from providers nurse.

## 2024-04-05 NOTE — Telephone Encounter (Signed)
 I was able to speak with the pt and she has stated she was seen by her GYN doctor who was able to address her concerns.

## 2024-04-07 ENCOUNTER — Other Ambulatory Visit: Payer: Self-pay

## 2024-04-07 ENCOUNTER — Telehealth: Payer: Self-pay

## 2024-04-07 MED ORDER — AMLODIPINE BESYLATE 5 MG PO TABS: 5.0000 mg | ORAL_TABLET | Freq: Every day | ORAL | 3 refills | Status: AC

## 2024-04-07 NOTE — Telephone Encounter (Signed)
 Copied from CRM #[id number]. Topic: Clinical - Medication Refill >> Apr 07, 2024  4:00 PM Montie POUR wrote: Medication:  amLODipine  (NORVASC ) 5 MG tablet  Has the patient contacted their pharmacy? Yes (Agent: If no, request that the patient contact the pharmacy for the refill. If patient does not wish to contact the pharmacy document the reason why and proceed with request.) (Agent: If yes, when and what did the pharmacy advise?) Pharmacy needs order to refill  This is the patient's preferred pharmacy:  [facility] [phone] - [place], [place] - [address] AT [facility] [address] [place] [address] Phone: [phone] Fax: [phone]  Is this the correct pharmacy for this prescription? Yes If no, delete pharmacy and type the correct one.   Has the prescription been filled recently? No  Is the patient out of the medication? No - She wants to pick up before the weather gets bad.  Has the patient been seen for an appointment in the last year OR does the patient have an upcoming appointment? Yes  Can we respond through MyChart? No  Agent: Please be advised that Rx refills may take up to 3 business days. We ask that you follow-up with your pharmacy.

## 2024-04-07 NOTE — Telephone Encounter (Signed)
 Medication has since been refilled

## 2024-04-14 ENCOUNTER — Other Ambulatory Visit: Payer: Self-pay | Admitting: Internal Medicine

## 2025-01-22 ENCOUNTER — Ambulatory Visit

## 2025-01-22 ENCOUNTER — Encounter: Admitting: Internal Medicine
